# Patient Record
Sex: Male | Born: 1965 | Race: Black or African American | Hispanic: No | Marital: Single | Smoking: Current every day smoker
Health system: Southern US, Community
[De-identification: ages and names within clinical notes are randomized; demographics above are authoritative.]

## PROBLEM LIST (undated history)

## (undated) DIAGNOSIS — L930 Discoid lupus erythematosus: Secondary | ICD-10-CM

## (undated) DIAGNOSIS — F1010 Alcohol abuse, uncomplicated: Secondary | ICD-10-CM

## (undated) DIAGNOSIS — I10 Essential (primary) hypertension: Secondary | ICD-10-CM

## (undated) DIAGNOSIS — Z8701 Personal history of pneumonia (recurrent): Secondary | ICD-10-CM

## (undated) DIAGNOSIS — I471 Supraventricular tachycardia, unspecified: Secondary | ICD-10-CM

## (undated) HISTORY — DX: Supraventricular tachycardia, unspecified: I47.10

## (undated) HISTORY — DX: Alcohol abuse, uncomplicated: F10.10

## (undated) HISTORY — DX: Personal history of pneumonia (recurrent): Z87.01

## (undated) HISTORY — DX: Supraventricular tachycardia: I47.1

## (undated) HISTORY — PX: ELBOW SURGERY: SHX618

## (undated) HISTORY — DX: Essential (primary) hypertension: I10

---

## 2000-08-28 ENCOUNTER — Emergency Department (HOSPITAL_COMMUNITY): Admission: EM | Admit: 2000-08-28 | Discharge: 2000-08-28 | Payer: Self-pay | Admitting: *Deleted

## 2001-05-24 ENCOUNTER — Emergency Department (HOSPITAL_COMMUNITY): Admission: EM | Admit: 2001-05-24 | Discharge: 2001-05-24 | Payer: Self-pay | Admitting: *Deleted

## 2001-08-02 ENCOUNTER — Encounter: Payer: Self-pay | Admitting: Emergency Medicine

## 2001-08-02 ENCOUNTER — Emergency Department (HOSPITAL_COMMUNITY): Admission: EM | Admit: 2001-08-02 | Discharge: 2001-08-02 | Payer: Self-pay | Admitting: Emergency Medicine

## 2001-08-02 ENCOUNTER — Observation Stay (HOSPITAL_COMMUNITY): Admission: EM | Admit: 2001-08-02 | Discharge: 2001-08-03 | Payer: Self-pay | Admitting: *Deleted

## 2001-08-03 ENCOUNTER — Encounter: Payer: Self-pay | Admitting: *Deleted

## 2001-09-03 ENCOUNTER — Emergency Department (HOSPITAL_COMMUNITY): Admission: EM | Admit: 2001-09-03 | Discharge: 2001-09-03 | Payer: Self-pay | Admitting: Emergency Medicine

## 2002-10-13 ENCOUNTER — Emergency Department (HOSPITAL_COMMUNITY): Admission: EM | Admit: 2002-10-13 | Discharge: 2002-10-13 | Payer: Self-pay | Admitting: Emergency Medicine

## 2003-10-11 ENCOUNTER — Emergency Department (HOSPITAL_COMMUNITY): Admission: EM | Admit: 2003-10-11 | Discharge: 2003-10-11 | Payer: Self-pay | Admitting: Emergency Medicine

## 2004-03-13 ENCOUNTER — Emergency Department (HOSPITAL_COMMUNITY): Admission: EM | Admit: 2004-03-13 | Discharge: 2004-03-13 | Payer: Self-pay | Admitting: Emergency Medicine

## 2004-07-16 ENCOUNTER — Emergency Department (HOSPITAL_COMMUNITY): Admission: EM | Admit: 2004-07-16 | Discharge: 2004-07-16 | Payer: Self-pay | Admitting: Emergency Medicine

## 2005-03-19 ENCOUNTER — Emergency Department (HOSPITAL_COMMUNITY): Admission: EM | Admit: 2005-03-19 | Discharge: 2005-03-19 | Payer: Self-pay | Admitting: Emergency Medicine

## 2007-07-30 ENCOUNTER — Emergency Department (HOSPITAL_COMMUNITY): Admission: EM | Admit: 2007-07-30 | Discharge: 2007-07-30 | Payer: Self-pay | Admitting: Emergency Medicine

## 2008-08-21 ENCOUNTER — Emergency Department (HOSPITAL_COMMUNITY): Admission: EM | Admit: 2008-08-21 | Discharge: 2008-08-21 | Payer: Self-pay | Admitting: Emergency Medicine

## 2010-01-23 ENCOUNTER — Inpatient Hospital Stay (HOSPITAL_COMMUNITY): Admission: EM | Admit: 2010-01-23 | Discharge: 2010-01-25 | Payer: Self-pay | Admitting: Emergency Medicine

## 2010-04-29 ENCOUNTER — Emergency Department (HOSPITAL_COMMUNITY)
Admission: EM | Admit: 2010-04-29 | Discharge: 2010-04-29 | Disposition: A | Discharge status: Home / Self Care | Payer: Self-pay | Attending: Emergency Medicine | Admitting: Emergency Medicine

## 2010-04-29 DIAGNOSIS — I10 Essential (primary) hypertension: Secondary | ICD-10-CM | POA: Insufficient documentation

## 2010-04-29 DIAGNOSIS — M329 Systemic lupus erythematosus, unspecified: Secondary | ICD-10-CM | POA: Insufficient documentation

## 2010-04-29 DIAGNOSIS — F17200 Nicotine dependence, unspecified, uncomplicated: Secondary | ICD-10-CM | POA: Insufficient documentation

## 2010-04-29 DIAGNOSIS — R51 Headache: Secondary | ICD-10-CM | POA: Insufficient documentation

## 2010-04-29 DIAGNOSIS — R42 Dizziness and giddiness: Secondary | ICD-10-CM | POA: Insufficient documentation

## 2010-04-29 LAB — BASIC METABOLIC PANEL
BUN: 7 mg/dL (ref 6–23)
CO2: 26 mEq/L (ref 19–32)
Calcium: 9.5 mg/dL (ref 8.4–10.5)
Chloride: 98 mEq/L (ref 96–112)
Glucose, Bld: 100 mg/dL — ABNORMAL HIGH (ref 70–99)
Sodium: 136 mEq/L (ref 135–145)

## 2010-04-29 LAB — ETHANOL: Alcohol, Ethyl (B): 138 mg/dL — ABNORMAL HIGH (ref 0–10)

## 2010-05-17 ENCOUNTER — Emergency Department (HOSPITAL_COMMUNITY)
Admission: EM | Admit: 2010-05-17 | Discharge: 2010-05-17 | Disposition: A | Discharge status: Home / Self Care | Payer: Self-pay | Attending: Emergency Medicine | Admitting: Emergency Medicine

## 2010-05-17 DIAGNOSIS — R51 Headache: Secondary | ICD-10-CM | POA: Insufficient documentation

## 2010-05-17 DIAGNOSIS — J329 Chronic sinusitis, unspecified: Secondary | ICD-10-CM | POA: Insufficient documentation

## 2010-05-17 DIAGNOSIS — I10 Essential (primary) hypertension: Secondary | ICD-10-CM | POA: Insufficient documentation

## 2010-06-01 LAB — URINALYSIS, ROUTINE W REFLEX MICROSCOPIC
Hgb urine dipstick: NEGATIVE
Ketones, ur: NEGATIVE mg/dL
Nitrite: NEGATIVE
pH: 6 (ref 5.0–8.0)

## 2010-06-01 LAB — CBC
HCT: 34 % — ABNORMAL LOW (ref 39.0–52.0)
HCT: 38.7 % — ABNORMAL LOW (ref 39.0–52.0)
MCHC: 33.5 g/dL (ref 30.0–36.0)
MCV: 91.7 fL (ref 78.0–100.0)
Platelets: 111 10*3/uL — ABNORMAL LOW (ref 150–400)
RDW: 12.6 % (ref 11.5–15.5)
RDW: 13 % (ref 11.5–15.5)
WBC: 3.1 10*3/uL — ABNORMAL LOW (ref 4.0–10.5)

## 2010-06-01 LAB — COMPREHENSIVE METABOLIC PANEL
ALT: 60 U/L — ABNORMAL HIGH (ref 0–53)
Albumin: 3.3 g/dL — ABNORMAL LOW (ref 3.5–5.2)
BUN: 14 mg/dL (ref 6–23)
CO2: 24 mEq/L (ref 19–32)
Chloride: 106 mEq/L (ref 96–112)
Creatinine, Ser: 0.9 mg/dL (ref 0.4–1.5)
Sodium: 137 mEq/L (ref 135–145)

## 2010-06-01 LAB — LIPID PANEL
Triglycerides: 104 mg/dL (ref <150)
VLDL: 21 mg/dL (ref 0–40)

## 2010-06-01 LAB — DIFFERENTIAL
Basophils Absolute: 0 10*3/uL (ref 0.0–0.1)
Eosinophils Relative: 2 % (ref 0–5)
Lymphocytes Relative: 37 % (ref 12–46)
Lymphocytes Relative: 41 % (ref 12–46)
Lymphs Abs: 1.5 10*3/uL (ref 0.7–4.0)
Monocytes Absolute: 0.2 10*3/uL (ref 0.1–1.0)
Monocytes Relative: 7 % (ref 3–12)
Neutro Abs: 1.9 10*3/uL (ref 1.7–7.7)
Neutrophils Relative %: 51 % (ref 43–77)

## 2010-06-01 LAB — BASIC METABOLIC PANEL
Calcium: 10 mg/dL (ref 8.4–10.5)
Chloride: 103 mEq/L (ref 96–112)
Creatinine, Ser: 0.95 mg/dL (ref 0.4–1.5)
Sodium: 140 mEq/L (ref 135–145)

## 2010-06-01 LAB — APTT: aPTT: 31 seconds (ref 24–37)

## 2010-06-01 LAB — POCT CARDIAC MARKERS
CKMB, poc: 9.2 ng/mL (ref 1.0–8.0)
Myoglobin, poc: 184 ng/mL (ref 12–200)
Troponin i, poc: <0.05 ng/mL (ref 0.00–0.09)

## 2010-06-01 LAB — CARDIAC PANEL(CRET KIN+CKTOT+MB+TROPI): CK, MB: 4.4 ng/mL — ABNORMAL HIGH (ref 0.3–4.0)

## 2010-06-01 LAB — RAPID URINE DRUG SCREEN, HOSP PERFORMED
Barbiturates: NOT DETECTED
Tetrahydrocannabinol: NOT DETECTED

## 2010-06-01 LAB — HEPATIC FUNCTION PANEL
ALT: 89 U/L — ABNORMAL HIGH (ref 0–53)
Albumin: 4.4 g/dL (ref 3.5–5.2)
Total Protein: 8.7 g/dL — ABNORMAL HIGH (ref 6.0–8.3)

## 2010-06-01 LAB — PROTIME-INR: Prothrombin Time: 12.6 seconds (ref 11.6–15.2)

## 2010-06-01 LAB — D-DIMER, QUANTITATIVE: D-Dimer, Quant: 0.47 ug/mL-FEU (ref 0.00–0.48)

## 2010-07-30 ENCOUNTER — Emergency Department (HOSPITAL_COMMUNITY)
Admission: EM | Admit: 2010-07-30 | Discharge: 2010-07-30 | Disposition: A | Discharge status: Home / Self Care | Payer: Self-pay | Attending: Emergency Medicine | Admitting: Emergency Medicine

## 2010-07-30 DIAGNOSIS — Y92009 Unspecified place in unspecified non-institutional (private) residence as the place of occurrence of the external cause: Secondary | ICD-10-CM | POA: Insufficient documentation

## 2010-07-30 DIAGNOSIS — IMO0002 Reserved for concepts with insufficient information to code with codable children: Secondary | ICD-10-CM | POA: Insufficient documentation

## 2010-07-30 DIAGNOSIS — T1500XA Foreign body in cornea, unspecified eye, initial encounter: Secondary | ICD-10-CM | POA: Insufficient documentation

## 2010-08-06 NOTE — Discharge Summary (Signed)
[place]  Patient:    Jim Gordon, Jim Gordon Visit Number: [id number] MRN: [id number]          Service Type: MED Location: [phone] Attending Physician:  Jim Gordon Dictated by:   Jim Gordon, P.A.-C. Admit Date:  08/02/2001 Discharge Date: 08/03/2001   CC:         Jim Gordon. Jim Gordon, M.D.  Jim Gordon, M.D.  Jim Gordon. Jim Gordon, M.D. [facility]   Referring Physician Discharge [place]  DATE OF BIRTH:  02-22-1966  PROCEDURES:  2-D echocardiogram.  HOSPITAL COURSE:  Jim Gordon is a 45 year old male with a history of systemic lupus, who had dull, substernal chest pain that started four days prior to admission.  It felt worse lying down and was not associated with any shortness of breath or other symptoms.  On the day of admission, the patient suddenly increased to a 10/10 and was very sharp, associated with shortness of breath. He came to the emergency room and was admitted to the hospital to rule out MI and for further evaluation.  He was started on anti-inflammatories because it was felt that this pain was most likely secondary to either pericarditis or a pericardial effusion.  The patient had a 2-D echocardiogram to assess for a pericardial effusion. The 2-D echocardiogram summary shows an ejection fraction of 65% with no regional wall motion abnormalities.  There was no pericardial effusion, but the left ventricular walls looked a little thickened.  The patient additionally had enzymes checked for MI, and these were negative serially. Additionally, he had a drug screen done which was also negative.  His C3 complement and C4 complement were within normal limits.  The next day, the patients pain was much better controlled, and his enzymes were negative.  He had no significant laboratory abnormalities except a mild anemia and borderline thrombocytopenia.  His white count was within normal limits, and ibuprofen was controlling his pain.   He was considered stable for discharge on Aug 03, 2001, with follow-up arranged with cardiology and a second opinion arranged with Dr. Coral Gordon.  The patient is additionally supposed to follow up with his primary care physician, Dr. Kellie Gordon.  2-D echocardiogram:  Ejection fraction was estimated to be 65% with no regional wall motion abnormalities and left ventricular thickness mildly increased.  There was trivial tricuspid valvular regurgitation and trivial mitral valvular regurgitation.  There was no pericardial effusion, and the aortic root was normal in size.  LABORATORY VALUES:  Hemoglobin 12.6, hematocrit 37.5, WBC 6.5, platelets 133. ESR 31.  INR 1.2, PTT 30.  Sodium 133, potassium 3.6, chloride 100, CO2 31, BUN 10, creatinine 0.9, glucose 102.  Other CMET values within normal limits. CK-MB elevated between 240-416, but the MBs are negative, and the relative index is negative.  Troponin I negative x 3 as well.  C3 complement 94, C4 complement 25.  UDS negative with urinalysis negative as well.  The patient has multiple laboratory testing pending at the time of dictation.  DISCHARGE CONDITION:  Improved.  DISCHARGE DIAGNOSES: 1. Chest pain, possibly felt secondary to pericarditis without a pericardial    effusion. 2. History of systemic lupus erythematosus. 3. History of pneumonia in February 2003. 4. History of ALLERGY - PENICILLIN. 5. Ongoing tobacco use. 6. History of frequent ETOH use. 7. Mild thrombocytopenia. 8. Mild anemia.  DISCHARGE INSTRUCTIONS: 1. His activity level is to be gradually increased, and he can return to work    on Monday. 2. He  is to stick to a low fat diet. 3. He is to quit tobacco if possible. 4. He is to follow up in the [place] office per patient request on Friday,    Aug 17, 2001, at 2:30 with Dr. Dietrich Gordon. 5. He is to follow up with Dr. Coral Gordon, and his office will call. 6. He is to follow up with Dr. Kellie Gordon, and he is to call for an  appointment.  DISCHARGE MEDICATIONS:  Motrin 800 mg 1 tab t.i.d. x 1 week. Dictated by:   Jim Gordon, P.A.-C. Attending Physician:  Jim Gordon DD:  08/03/01 TD:  08/03/01 Job: [id number]

## 2010-12-15 LAB — CBC
HCT: 39.8
Hemoglobin: 13.7
MCHC: 34.4
MCV: 89.7
Platelets: 134 — ABNORMAL LOW
RDW: 12.7
WBC: 3.9 — ABNORMAL LOW

## 2010-12-15 LAB — BASIC METABOLIC PANEL
BUN: 8
Creatinine, Ser: 0.91
GFR calc Af Amer: >60
GFR calc non Af Amer: >60
Glucose, Bld: 89
Potassium: 4.5

## 2010-12-15 LAB — D-DIMER, QUANTITATIVE: D-Dimer, Quant: 0.28

## 2010-12-15 LAB — TROPONIN I: Troponin I: 0.04

## 2010-12-15 LAB — DIFFERENTIAL
Basophils Absolute: 0
Basophils Relative: 1
Eosinophils Absolute: 0.1
Lymphs Abs: 1
Neutrophils Relative %: 64

## 2011-02-15 ENCOUNTER — Emergency Department (HOSPITAL_COMMUNITY): Payer: Self-pay

## 2011-02-15 ENCOUNTER — Emergency Department (HOSPITAL_COMMUNITY)
Admission: EM | Admit: 2011-02-15 | Discharge: 2011-02-15 | Disposition: A | Discharge status: Home / Self Care | Payer: Self-pay | Attending: Emergency Medicine | Admitting: Emergency Medicine

## 2011-02-15 ENCOUNTER — Encounter: Payer: Self-pay | Admitting: Emergency Medicine

## 2011-02-15 DIAGNOSIS — S61409A Unspecified open wound of unspecified hand, initial encounter: Secondary | ICD-10-CM | POA: Insufficient documentation

## 2011-02-15 DIAGNOSIS — I10 Essential (primary) hypertension: Secondary | ICD-10-CM | POA: Insufficient documentation

## 2011-02-15 DIAGNOSIS — W268XXA Contact with other sharp object(s), not elsewhere classified, initial encounter: Secondary | ICD-10-CM | POA: Insufficient documentation

## 2011-02-15 DIAGNOSIS — T148XXA Other injury of unspecified body region, initial encounter: Secondary | ICD-10-CM

## 2011-02-15 DIAGNOSIS — F17200 Nicotine dependence, unspecified, uncomplicated: Secondary | ICD-10-CM | POA: Insufficient documentation

## 2011-02-15 DIAGNOSIS — L930 Discoid lupus erythematosus: Secondary | ICD-10-CM | POA: Insufficient documentation

## 2011-02-15 DIAGNOSIS — Z23 Encounter for immunization: Secondary | ICD-10-CM | POA: Insufficient documentation

## 2011-02-15 HISTORY — DX: Discoid lupus erythematosus: L93.0

## 2011-02-15 LAB — DIFFERENTIAL
Basophils Absolute: 0 10*3/uL (ref 0.0–0.1)
Basophils Relative: 1 % (ref 0–1)
Eosinophils Absolute: 0.2 10*3/uL (ref 0.0–0.7)
Neutro Abs: 1.7 10*3/uL (ref 1.7–7.7)
Neutrophils Relative %: 40 % — ABNORMAL LOW (ref 43–77)

## 2011-02-15 LAB — BASIC METABOLIC PANEL
Chloride: 99 mEq/L (ref 96–112)
GFR calc Af Amer: >90 mL/min (ref >90)
GFR calc non Af Amer: >90 mL/min (ref >90)
Potassium: 4.1 mEq/L (ref 3.5–5.1)
Sodium: 136 mEq/L (ref 135–145)

## 2011-02-15 LAB — CBC
Hemoglobin: 12.2 g/dL — ABNORMAL LOW (ref 13.0–17.0)
MCHC: 32.5 g/dL (ref 30.0–36.0)
Platelets: 212 10*3/uL (ref 150–400)
RDW: 12.8 % (ref 11.5–15.5)

## 2011-02-15 MED ORDER — LEVOFLOXACIN IN D5W 500 MG/100ML IV SOLN
500.0000 mg | Freq: Once | INTRAVENOUS | Status: AC
  Administered 2011-02-15: 500 mg via INTRAVENOUS
  Filled 2011-02-15: qty 100

## 2011-02-15 MED ORDER — LEVOFLOXACIN 500 MG PO TABS: 500.0000 mg | ORAL_TABLET | Freq: Every day | ORAL | Status: AC

## 2011-02-15 MED ORDER — HYDROCODONE-ACETAMINOPHEN 5-325 MG PO TABS: 1.0000 | ORAL_TABLET | Freq: Four times a day (QID) | ORAL | Status: AC | PRN

## 2011-02-15 MED ORDER — TETANUS-DIPHTH-ACELL PERTUSSIS 5-2.5-18.5 LF-MCG/0.5 IM SUSP
0.5000 mL | Freq: Once | INTRAMUSCULAR | Status: AC
  Administered 2011-02-15: 0.5 mL via INTRAMUSCULAR
  Filled 2011-02-15: qty 0.5

## 2011-02-15 NOTE — ED Provider Notes (Signed)
History  This chart was scribed for Celene Kras, MD by Bennett Scrape. This patient was seen in room APA02/APA02 and the patient's care was started at 8:51AM.  CSN: [id number] Arrival date & time: 02/15/2011  8:45 AM   First MD Initiated Contact with Patient 02/15/11 [phone]      Chief Complaint  Patient presents with  . Facial Swelling  . Arm Swelling  . Sore Throat    The history is provided by the patient. No language interpreter was used.   Jim Gordon is a 45 y.o. male who presents to the Emergency Department complaining of 4 days of mild, constant right hand swelling that occurred after a nail punctured the pt's right hand while he was using a hammer. Pt states that he developed a gradually worsening sore throat a few days after and reports that he noticed that his lips had become swollen this morning. Pt reports that the puncture wound has scabbed over and denies pain to the area with movement or palpation. Pt denies fever, vomiting and diarrhea as associated symptoms. Pt denies knowledge of receiving a tetanus shot within the past 10 years. Pt has a h/o HTN and discoid lupus. He is a smoker.   Past Medical History  Diagnosis Date  . Hypertension   . Discoid lupus     Past Surgical History  Procedure Date  . Elbow surgery     left    History reviewed. No pertinent family history.  History  Substance Use Topics  . Smoking status: Current Everyday Smoker -- 1.0 packs/day for 20 years    Types: Cigarettes  . Smokeless tobacco: Never Used  . Alcohol Use: 30.0 oz/week    50 Cans of beer per week     Review of Systems A complete 10 system review of systems was obtained and is otherwise negative except as noted in the HPI.   Allergies  Penicillins  Home Medications   Current Outpatient Rx  Name Route Sig Dispense Refill  . LISINOPRIL 5 MG PO TABS Oral Take 5 mg by mouth daily.      Marland Kitchen METOPROLOL TARTRATE 25 MG PO TABS Oral Take 25 mg by mouth 2 (two) times  daily.       Triage Vitals: BP 121/89  Pulse 76  Temp 98.3 F (36.8 C)  Resp 16  Ht 6' 1.5" (1.867 m)  Wt 185 lb (83.915 kg)  BMI 24.08 kg/m2  SpO2 98%  Physical Exam  Nursing note and vitals reviewed. Constitutional: He appears well-developed and well-nourished. No distress.  HENT:  Head: Normocephalic and atraumatic.  Right Ear: External ear normal.  Left Ear: External ear normal.       Poor dentition, mild erythema of pharnyx but no tongue swelling, mild erythema and swelling to both lips  Eyes: Conjunctivae are normal. Right eye exhibits no discharge. Left eye exhibits no discharge. No scleral icterus.  Neck: Neck supple. No tracheal deviation present.  Cardiovascular: Normal rate, regular rhythm and intact distal pulses.   Pulmonary/Chest: Effort normal and breath sounds normal. No stridor. No respiratory distress. He has no wheezes. He has no rales.  Abdominal: Soft. Bowel sounds are normal. He exhibits no distension. There is no tenderness. There is no rebound and no guarding.  Musculoskeletal: He exhibits no edema and no tenderness.       Healed puncture wound on TM joint of the right 5th metatarsal, mild erythema around the puncture with mild right hand swelling, no pain upon  flexion of the right 5th finger or right wrist  Neurological: He is alert. He has normal strength. No sensory deficit. Cranial nerve deficit:  no gross defecits noted. He exhibits normal muscle tone. He displays no seizure activity. Coordination normal.  Skin: Skin is warm and dry. No rash noted.       Areas of hypopigmentation and erythema on both checks with scabbing along the sides   Psychiatric: He has a normal mood and affect.    ED Course  Procedures (including critical care time)  DIAGNOSTIC STUDIES: Oxygen Saturation is 98% on room air, normal by my interpretation.    COORDINATION OF CARE: 8:53AM-Discussed treatment plan with patient at bedside and patient agreed to plan.   Labs  Reviewed  CBC - Abnormal; Notable for the following:    RBC 4.20 (*)    Hemoglobin 12.2 (*)    HCT 37.5 (*)    All other components within normal limits  DIFFERENTIAL - Abnormal; Notable for the following:    Neutrophils Relative 40 (*)    All other components within normal limits  BASIC METABOLIC PANEL   Dg Hand Complete Right  02/15/2011  *RADIOLOGY REPORT*  Clinical Data: Swelling.  Nail injury fifth metacarpal.  Pain and swelling in the region of the fifth metacarpal.  RIGHT HAND - COMPLETE 3+ VIEW  Comparison: None.  Findings: Bony mineralization is normal and the joints of the hand are aligned.  There is some soft tissue swelling adjacent to the fifth metacarpal.  On the frontal view, a tiny, approximately 2 mm, radiopaque density projects adjacent to the ulnar side of the fifth metacarpal head. This density is not definitely seen on additional views.  This could be a small amount of debris within or overlying soft tissues, or a tiny bony fragment.  Otherwise, the fifth metacarpal appears intact and specifically there is no lucency through the metacarpal.  It is noted that the tuft of the distal phalanx of the fourth digit is irregular and contour, likely chronic and there is a 1 mm bony fragment just distal to the tuft, which could be chronic, or could reflect a tiny acute fracture fragment.  Suggest clinical correlation.  IMPRESSION:  1.  Tiny radiodensity adjacent to the fifth metacarpal head seen on the frontal view.  Question small amount of debris internal or external to the hand versus a tiny bony fracture fragment. 2.  Tiny bony density adjacent to the tuft of the fourth finger. This may be chronic, given the diffuse irregularity of the contour of the tuft of the distal phalanx.  Tiny acute fracture fragment not excluded.  Original Report Authenticated By: Britta Mccreedy, M.D.     Diagnosis: Puncture wound   MDM  Patient has soft tissue swelling following a puncture wound. X-ray  suggests the possibility of either a small chip fracture or or retained foreign body. Patient is not demonstrating signs of tenosynovitis as I am able to passively range his finger without significant pain. However I do have some concerns about an early infection. Patient will be splinted. He'll prescribe him a course of Levaquin. I will give him referral to see Dr. Hilda Lias in followup.  I personally performed the services described in this documentation, which was scribed in my presence.  The recorded information has been reviewed and considered.       Celene Kras, MD 02/15/11 1026

## 2011-02-15 NOTE — ED Notes (Addendum)
Patient had nail go through right hand. Right hand notably swollen. Wound to had scabbed over. Per patient woke up this morning and lips were swollen, sore throat. Per patient believed to have had tetanus shot within last 10 years but not completely sure.

## 2012-04-01 ENCOUNTER — Encounter (HOSPITAL_COMMUNITY): Payer: Self-pay | Admitting: *Deleted

## 2012-04-01 ENCOUNTER — Emergency Department (HOSPITAL_COMMUNITY)
Admission: EM | Admit: 2012-04-01 | Discharge: 2012-04-01 | Disposition: A | Discharge status: Home / Self Care | Payer: Self-pay | Attending: Emergency Medicine | Admitting: Emergency Medicine

## 2012-04-01 DIAGNOSIS — R51 Headache: Secondary | ICD-10-CM | POA: Insufficient documentation

## 2012-04-01 DIAGNOSIS — L930 Discoid lupus erythematosus: Secondary | ICD-10-CM | POA: Insufficient documentation

## 2012-04-01 DIAGNOSIS — F17200 Nicotine dependence, unspecified, uncomplicated: Secondary | ICD-10-CM | POA: Insufficient documentation

## 2012-04-01 DIAGNOSIS — Z79899 Other long term (current) drug therapy: Secondary | ICD-10-CM | POA: Insufficient documentation

## 2012-04-01 DIAGNOSIS — I10 Essential (primary) hypertension: Secondary | ICD-10-CM | POA: Insufficient documentation

## 2012-04-01 MED ORDER — FENTANYL CITRATE 0.05 MG/ML IJ SOLN
100.0000 ug | Freq: Once | INTRAMUSCULAR | Status: AC
  Administered 2012-04-01: 100 ug via NASAL

## 2012-04-01 MED ORDER — FENTANYL CITRATE 0.05 MG/ML IJ SOLN
100.0000 ug | Freq: Once | INTRAMUSCULAR | Status: DC
  Filled 2012-04-01: qty 2

## 2012-04-01 MED ORDER — METOCLOPRAMIDE HCL 10 MG PO TABS: 10.0000 mg | ORAL_TABLET | Freq: Four times a day (QID) | ORAL | Status: DC | PRN

## 2012-04-01 MED ORDER — HYDROCODONE-ACETAMINOPHEN 5-325 MG PO TABS
2.0000 | ORAL_TABLET | ORAL | Status: DC | PRN
Start: 2012-04-01 — End: 2012-06-09

## 2012-04-01 MED ORDER — DIPHENHYDRAMINE HCL 25 MG PO CAPS
50.0000 mg | ORAL_CAPSULE | Freq: Once | ORAL | Status: AC
  Administered 2012-04-01: 50 mg via ORAL
  Filled 2012-04-01: qty 2

## 2012-04-01 MED ORDER — METOCLOPRAMIDE HCL 5 MG/ML IJ SOLN
10.0000 mg | Freq: Once | INTRAMUSCULAR | Status: AC
  Administered 2012-04-01: 10 mg via INTRAMUSCULAR
  Filled 2012-04-01: qty 2

## 2012-04-01 MED ORDER — ONDANSETRON 8 MG PO TBDP: ORAL_TABLET | ORAL | Status: DC

## 2012-04-01 NOTE — ED Provider Notes (Signed)
History  This chart was scribed for Jim Horn, MD by Madaline Brilliant, ED Scribe. The patient was seen in room APA10/APA10. Patient's care was started at 1620.  CSN: [id number]  Arrival date & time 04/01/12  1453   First MD Initiated Contact with Patient 04/01/12 1620      Chief Complaint  Patient presents with  . Headache   The history is provided by the patient. No language interpreter was used.   Jim Gordon is a 47 y.o. male who presents to the Emergency Department complaining of a intermittent, moderate headache on the top of his head of gradual onset this morning, gradually worsening throughout the day. He states that he took HTN medication (lisonopril) and ibuprofen today with no relief of headache. Patient states that he had missed a couple of days of BP pills. Patient states a history of once-daily headaches over the last couple of years. He denies changes in vision by headaches, but states that he was diagnosed with cataracts a couple of months ago. Patient denies fever, changes in speech, vision changes currently, dysphagia, and changes in understanding. No weakness or loss of coordination and recent head trauma or current immediate family history of headaches, or present numbness. Ocassional headaches onces every day or so.  Blood pressure is almost normal today with resuming medication.  Past Medical History  Diagnosis Date  . Hypertension   . Discoid lupus     Past Surgical History  Procedure Date  . Elbow surgery     left    No family history on file.  History  Substance Use Topics  . Smoking status: Current Every Day Smoker -- 1.0 packs/day for 20 years    Types: Cigarettes  . Smokeless tobacco: Never Used  . Alcohol Use: 30.0 oz/week    50 Cans of beer per week      Review of Systems  All other systems reviewed and are negative.  10 Systems reviewed and all are negative for acute change except as noted in the HPI.    Allergies  Penicillins  Home  Medications   Current Outpatient Rx  Name  Route  Sig  Dispense  Refill  . LISINOPRIL 5 MG PO TABS   Oral   Take 5 mg by mouth daily.           Marland Kitchen METOPROLOL TARTRATE 25 MG PO TABS   Oral   Take 25 mg by mouth 2 (two) times daily.          Marland Kitchen HYDROCODONE-ACETAMINOPHEN 5-325 MG PO TABS   Oral   Take 2 tablets by mouth every 4 (four) hours as needed for pain.   6 tablet   0   . METOCLOPRAMIDE HCL 10 MG PO TABS   Oral   Take 1 tablet (10 mg total) by mouth every 6 (six) hours as needed (nausea/headache).   6 tablet   0   . ONDANSETRON 8 MG PO TBDP      8mg  ODT q4 hours prn nausea   4 tablet   0     Triage Vitals:  BP 149/90  Pulse 71  Temp 98 F (36.7 C)  Resp 18  SpO2 100%  Physical Exam  Nursing note and vitals reviewed. Constitutional:       Awake, alert, nontoxic appearance with baseline speech for patient.  HENT:  Head: Atraumatic.  Mouth/Throat: No oropharyngeal exudate.  Eyes: EOM are normal. Pupils are equal, round, and reactive to light. Right eye  exhibits no discharge. Left eye exhibits no discharge.  Neck: Neck supple.  Cardiovascular: Normal rate and regular rhythm.   No murmur heard. Pulmonary/Chest: Effort normal and breath sounds normal. No stridor. No respiratory distress. He has no wheezes. He has no rales. He exhibits no tenderness.  Abdominal: Soft. Bowel sounds are normal. He exhibits no mass. There is no tenderness. There is no rebound.  Musculoskeletal: He exhibits no tenderness.       Baseline ROM, moves extremities with no obvious new focal weakness.  Lymphadenopathy:    He has no cervical adenopathy.  Neurological:       Awake, alert, cooperative and aware of situation; motor strength bilaterally; sensation normal to light touch bilaterally; peripheral visual fields full to confrontation; no facial asymmetry; tongue midline; major cranial nerves appear intact; no pronator drift, normal finger to nose bilaterally, baseline gait without  new ataxia.  Skin: No rash noted.  Psychiatric: He has a normal mood and affect.      ED Course  Procedures (including critical care time)  DIAGNOSTIC STUDIES: Oxygen Saturation is 100% on room air, normal, by my interpretation.    COORDINATION OF CARE:  1625: Patient / Family / Caregiver understand and agree with initial ED impression and plan with expectations set for ED visit. 1645 Ordered:  Meds ordered this encounter  Medications  . diphenhydrAMINE (BENADRYL) capsule 50 mg    Sig:   . DISCONTD: fentaNYL (SUBLIMAZE) injection 100 mcg    Sig:   . metoCLOPramide (REGLAN) injection 10 mg    Sig:   . HYDROcodone-acetaminophen (NORCO) 5-325 MG per tablet    Sig: Take 2 tablets by mouth every 4 (four) hours as needed for pain.    Dispense:  6 tablet    Refill:  0  . metoCLOPramide (REGLAN) 10 MG tablet    Sig: Take 1 tablet (10 mg total) by mouth every 6 (six) hours as needed (nausea/headache).    Dispense:  6 tablet    Refill:  0  . ondansetron (ZOFRAN ODT) 8 MG disintegrating tablet    Sig: 8mg  ODT q4 hours prn nausea    Dispense:  4 tablet    Refill:  0  . fentaNYL (SUBLIMAZE) injection 100 mcg    Sig:      Labs Reviewed - No data to display No results found.   1. Headache       MDM   I personally performed the services described in this documentation, which was scribed in my presence. The recorded information has been reviewed and is accurate.  Patient / Family / Caregiver informed of clinical course, understand medical decision-making process, and agree with plan.I doubt any other EMC precluding discharge at this time including, but not necessarily limited to the following:SAH, CVA.     Jim Horn, MD 04/02/12 [phone]

## 2012-04-01 NOTE — ED Notes (Signed)
MD at bedside. 

## 2012-04-01 NOTE — ED Notes (Signed)
Patient with no complaints at this time. Respirations even and unlabored. Skin warm/dry. Discharge instructions reviewed with patient at this time. Patient given opportunity to voice concerns/ask questions. Patient discharged at this time and left Emergency Department with steady gait.   

## 2012-04-01 NOTE — ED Notes (Signed)
Pt states that he has been out of his blood pressure medication for a few days, began to experience headache located on top of the head with gradual onset this am, did take his medication today, lisinopril 5mg  with no change in headache. Denies any n/v, vision changes.

## 2012-06-09 ENCOUNTER — Encounter (HOSPITAL_COMMUNITY): Payer: Self-pay | Admitting: Emergency Medicine

## 2012-06-09 ENCOUNTER — Emergency Department (HOSPITAL_COMMUNITY): Payer: Self-pay

## 2012-06-09 ENCOUNTER — Emergency Department (HOSPITAL_COMMUNITY)
Admission: EM | Admit: 2012-06-09 | Discharge: 2012-06-09 | Disposition: A | Discharge status: Home / Self Care | Payer: Self-pay | Attending: Emergency Medicine | Admitting: Emergency Medicine

## 2012-06-09 DIAGNOSIS — F411 Generalized anxiety disorder: Secondary | ICD-10-CM | POA: Insufficient documentation

## 2012-06-09 DIAGNOSIS — Z79899 Other long term (current) drug therapy: Secondary | ICD-10-CM | POA: Insufficient documentation

## 2012-06-09 DIAGNOSIS — F17200 Nicotine dependence, unspecified, uncomplicated: Secondary | ICD-10-CM | POA: Insufficient documentation

## 2012-06-09 DIAGNOSIS — R51 Headache: Secondary | ICD-10-CM | POA: Insufficient documentation

## 2012-06-09 DIAGNOSIS — F1010 Alcohol abuse, uncomplicated: Secondary | ICD-10-CM | POA: Insufficient documentation

## 2012-06-09 DIAGNOSIS — Z872 Personal history of diseases of the skin and subcutaneous tissue: Secondary | ICD-10-CM | POA: Insufficient documentation

## 2012-06-09 DIAGNOSIS — I10 Essential (primary) hypertension: Secondary | ICD-10-CM | POA: Insufficient documentation

## 2012-06-09 DIAGNOSIS — E876 Hypokalemia: Secondary | ICD-10-CM | POA: Insufficient documentation

## 2012-06-09 DIAGNOSIS — Z760 Encounter for issue of repeat prescription: Secondary | ICD-10-CM | POA: Insufficient documentation

## 2012-06-09 DIAGNOSIS — R5381 Other malaise: Secondary | ICD-10-CM | POA: Insufficient documentation

## 2012-06-09 LAB — URINALYSIS, ROUTINE W REFLEX MICROSCOPIC
Glucose, UA: NEGATIVE mg/dL
Leukocytes, UA: NEGATIVE
Nitrite: NEGATIVE
Specific Gravity, Urine: >1.03 — ABNORMAL HIGH (ref 1.005–1.030)
pH: 6 (ref 5.0–8.0)

## 2012-06-09 LAB — CBC WITH DIFFERENTIAL/PLATELET
Basophils Absolute: 0 10*3/uL (ref 0.0–0.1)
Basophils Relative: 1 % (ref 0–1)
HCT: 37.4 % — ABNORMAL LOW (ref 39.0–52.0)
Hemoglobin: 12.8 g/dL — ABNORMAL LOW (ref 13.0–17.0)
Lymphocytes Relative: 30 % (ref 12–46)
Monocytes Absolute: 0.2 10*3/uL (ref 0.1–1.0)
Monocytes Relative: 5 % (ref 3–12)
Neutro Abs: 2.7 10*3/uL (ref 1.7–7.7)
Neutrophils Relative %: 63 % (ref 43–77)
WBC: 4.3 10*3/uL (ref 4.0–10.5)

## 2012-06-09 LAB — BASIC METABOLIC PANEL
BUN: 11 mg/dL (ref 6–23)
CO2: 26 mEq/L (ref 19–32)
Chloride: 100 mEq/L (ref 96–112)
Creatinine, Ser: 0.84 mg/dL (ref 0.50–1.35)
GFR calc Af Amer: >90 mL/min (ref >90)
Potassium: 3.1 mEq/L — ABNORMAL LOW (ref 3.5–5.1)

## 2012-06-09 LAB — RAPID URINE DRUG SCREEN, HOSP PERFORMED
Benzodiazepines: NOT DETECTED
Cocaine: NOT DETECTED
Opiates: NOT DETECTED

## 2012-06-09 LAB — ETHANOL: Alcohol, Ethyl (B): 53 mg/dL — ABNORMAL HIGH (ref 0–11)

## 2012-06-09 LAB — URINE MICROSCOPIC-ADD ON

## 2012-06-09 MED ORDER — METOPROLOL TARTRATE 25 MG PO TABS: 25.0000 mg | ORAL_TABLET | Freq: Two times a day (BID) | ORAL | Status: DC

## 2012-06-09 MED ORDER — POTASSIUM CHLORIDE ER 10 MEQ PO TBCR: 10.0000 meq | EXTENDED_RELEASE_TABLET | Freq: Two times a day (BID) | ORAL | Status: DC

## 2012-06-09 MED ORDER — LISINOPRIL 5 MG PO TABS: 5.0000 mg | ORAL_TABLET | Freq: Every day | ORAL | Status: DC

## 2012-06-09 NOTE — ED Notes (Signed)
Pt ambulated around nurses station twice at this time no sob or dizziness noted 95 % on room air. Jim Gordon

## 2012-06-09 NOTE — ED Provider Notes (Signed)
History   The patient was seen in room APA04/APA04. Patient's care was started at 8:54 AM.    CSN: [id number]  Arrival date & time 06/09/12  4098   First MD Initiated Contact with Patient 06/09/12 [phone]      Chief Complaint  Patient presents with  . Dizziness  . Hypertension  . 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  He states that he has been without his BP medications for 2 months and admits to drinking alcohol yesterday and again this morning.  States he was riding a scooter from a friend's house this am when his sx's began.  He also c/o feeling "shakey and weak all over" and states his head hurts "all over the top" .  He denies shortness of breath, vomiting, chest pain , localized weakness or abd pain.    Patient is a 47 y.o. male presenting with neurologic complaint. The history is provided by the patient.  Neurologic Problem The primary symptoms include headaches and dizziness. Primary symptoms do not include syncope, loss of consciousness, altered mental status, seizures, visual change, paresthesias, focal weakness, loss of sensation, memory loss, fever, nausea or vomiting. The symptoms began 2 to 6 hours ago. The symptoms are unchanged. The neurological symptoms are diffuse.  The headache began today. The headache developed gradually. Headache is a new problem. The headache is present continuously. Location/region(s) of the headache: parietal. Associated with: alcohol intake. The headache is associated with weakness and loss of balance. The headache is not associated with aura, photophobia, eye pain, visual change, neck stiffness or paresthesias.  Dizziness also occurs with weakness. Dizziness  does not occur with tinnitus, nausea or vomiting.  Additional symptoms include weakness and loss of balance. Additional symptoms do not include neck stiffness, lower back pain, leg pain, photophobia, aura, tinnitus, vertigo or irritability. Medical issues also include alcohol use and hypertension. Medical issues do not include seizures, cerebral vascular accident, cancer, drug use or diabetes.    Past Medical History  Diagnosis Date  . Hypertension   . Discoid lupus     Past Surgical History  Procedure Laterality Date  . Elbow surgery      left    History reviewed. No pertinent family history.  History  Substance Use Topics  . Smoking status: Current Every Day Smoker -- 1.00 packs/day for 20 years    Types: Cigarettes  . Smokeless tobacco: Never Used  . Alcohol Use: 30.0 oz/week    50 Cans of beer per week      Review of Systems  Constitutional: Negative for fever, activity change, appetite change and irritability.  HENT: Negative for facial swelling, trouble swallowing, neck pain, neck stiffness and tinnitus.   Eyes: Negative for photophobia, pain and visual disturbance.  Respiratory: Negative for chest tightness and shortness of breath.   Cardiovascular: Negative for chest pain and syncope.  Gastrointestinal: Negative for nausea, vomiting and abdominal pain.  Genitourinary: Negative for dysuria and hematuria.  Skin: Negative for rash and wound.  Neurological: Positive for dizziness, weakness, headaches and loss of balance. Negative for vertigo, focal weakness, seizures, loss of consciousness, syncope, facial asymmetry, speech difficulty, numbness and paresthesias.  Psychiatric/Behavioral: Negative for memory loss, confusion, decreased concentration and altered mental status. The patient is nervous/anxious.  All other systems reviewed and are negative.    Allergies  Penicillins  Home Medications   Current Outpatient Rx  Name  Route  Sig  Dispense  Refill  .  HYDROcodone-acetaminophen (NORCO) 5-325 MG per tablet   Oral   Take 2 tablets by mouth every 4 (four) hours as needed for pain.   6 tablet   0   . lisinopril (PRINIVIL,ZESTRIL) 5 MG tablet   Oral   Take 5 mg by mouth daily.           . metoCLOPramide (REGLAN) 10 MG tablet   Oral   Take 1 tablet (10 mg total) by mouth every 6 (six) hours as needed (nausea/headache).   6 tablet   0   . metoprolol tartrate (LOPRESSOR) 25 MG tablet   Oral   Take 25 mg by mouth 2 (two) times daily.          . ondansetron (ZOFRAN ODT) 8 MG disintegrating tablet      8mg  ODT q4 hours prn nausea   4 tablet   0     There were no vitals taken for this visit.  Physical Exam  Nursing note and vitals reviewed. Constitutional: He is oriented to person, place, and time. He appears well-developed and well-nourished. No distress.  Patient appears anxious and smells of ETOH  HENT:  Head: Normocephalic and atraumatic.  Mouth/Throat: Oropharynx is clear and moist.  Eyes: Conjunctivae and EOM are normal. Pupils are equal, round, and reactive to light.  Neck: Normal range of motion and phonation normal. Neck supple. No rigidity. No Brudzinski's sign and no Kernig's sign noted.  Cardiovascular: Normal rate, regular rhythm, normal heart sounds and intact distal pulses.   No murmur heard. Pulmonary/Chest: Effort normal and breath sounds normal. He exhibits no tenderness.  Abdominal: Soft. He exhibits no distension. There is no tenderness. There is no rebound and no guarding.  Musculoskeletal: Normal range of motion.  Neurological: He is alert and oriented to person, place, and time. He has normal reflexes. No cranial nerve deficit or sensory deficit. He exhibits normal muscle tone. Coordination and gait normal.  Reflex Scores:      Tricep reflexes are 2+ on the right side and 2+ on the left side.      Bicep reflexes are 2+ on the right side and 2+ on the left side. Skin: Skin is warm and dry.    ED  Course  Procedures (including critical care time) DIAGNOSTIC STUDIES: Oxygen Saturation is 99% on room air, normal,  by my interpretation.    COORDINATION OF CARE: 8:55 AM Discussed ED treatment with pt and pt agrees.     Labs Reviewed  CBC WITH DIFFERENTIAL - Abnormal; Notable for the following:    Hemoglobin 12.8 (*)    HCT 37.4 (*)    All other components within normal limits  BASIC METABOLIC PANEL - Abnormal; Notable for the following:    Potassium 3.1 (*)    All other components within normal limits  ETHANOL - Abnormal; Notable for the following:    Alcohol, Ethyl (B) 53 (*)    All other components within normal limits  TROPONIN I  URINALYSIS, ROUTINE W REFLEX MICROSCOPIC  URINE RAPID DRUG SCREEN (HOSP PERFORMED)   Dg Chest 2 View  06/09/2012  *RADIOLOGY REPORT*  Clinical Data: Dizziness, headache, hypertension  CHEST - 2 VIEW  Comparison: 01/23/2010  Findings: Heart size and vascular pattern are normal.  No infiltrate or effusion.  No change from  prior study.  IMPRESSION: No acute abnormalities.   Original Report Authenticated By: Esperanza Heir, M.D.    Ct Head Wo Contrast  06/09/2012  *RADIOLOGY REPORT*  Clinical Data: Dizziness, hypertension, headache  CT HEAD WITHOUT CONTRAST  Technique:  Contiguous axial images were obtained from the base of the skull through the vertex without contrast.  Comparison: 01/23/2010  Findings: Calvarium is intact.  No hemorrhage, infarct, or mass. No hydrocephalus.  There is mucosal periosteal thickening involving the right sphenoid sinus which is stable from prior study.  IMPRESSION: No acute findings.  Stable chronic sphenoid sinus inflammation.   Original Report Authenticated By: Esperanza Heir, M.D.         MDM    Date: 06/09/2012  Rate: 79  Rhythm: normal sinus rhythm  QRS Axis: normal  Intervals: normal  ST/T Wave abnormalities: nonspecific ST/T changes  Conduction Disutrbances:none  Narrative Interpretation:  LVH  Old EKG  Reviewed: T wave changes improved from 01/23/10  EKG read by Dr. Deretha Emory    Patient is feeling better,  Previous sx's ave resolved.  Has ambulated in the dept w/o difficulty.  No focal neuro deficits on exam.  Appears stable for d/c and requesting to go home. Family member at bedside   patient's hypokalemic, will prescribe short course of potassium and refills of his BP meds.  He agrees to close f/u with his PMD at the [facility] de[facility].  Clinical suspicion for CVA, TIA, SAH or ACS is low.    I have discussed pt hx and care plan with the EDP and pt also seen by EDP prior to d/c.   The patient appears reasonably screened and/or stabilized for discharge and I doubt any other medical condition or other [facility] requiring further screening, evaluation, or treatment in the ED at this time prior to discharge.     Amaryllis Malmquist L. Trisha Mangle, PA-C 06/10/12 2214

## 2012-06-09 NOTE — ED Notes (Signed)
Patient smells of ETOH. States he usually drinks about 3-4 40 oz beers per day. Patient stated this morning he woke up and started on his first 40 oz but estimated he only drank about 12 oz because "I felt dizzy and had a headache." Stating recently released from jail and had not been able to take BP meds due to incarceration. Patient drowsy, will arouse to voice, oriented x 4. No deficits noted. Denies drug use.

## 2012-06-09 NOTE — ED Provider Notes (Signed)
  Medical screening examination/treatment/procedure(s) were conducted as a shared visit with non-physician practitioner(s) and myself.  I personally evaluated the patient during the encounter  Patient seen by me. Patient came in for headache and dizziness. Patient's blood pressure has been uncontrolled. Patient will need followup for the blood pressure. Workup here in the emergency part without any sniffing findings. Patient can be discharged home. Patient sounds as if he has a chronic alcohol problem. And treating heavily of late. For the referral for guidelines for that would be important as well.  Shelda Jakes, MD 06/09/12 1322

## 2012-06-09 NOTE — ED Notes (Signed)
Patient c/o headache with dizziness. Per patient has high blood pressure but has been incarcerated x 2months and was unable to take medication. Patient c/o weakness all over.

## 2012-06-11 NOTE — ED Provider Notes (Signed)
Medical screening examination/treatment/procedure(s) were conducted as a shared visit with non-physician practitioner(s) and myself.  I personally evaluated the patient during the encounter   Shelda Jakes, MD 06/11/12 1102

## 2012-10-07 ENCOUNTER — Emergency Department (HOSPITAL_COMMUNITY)
Admission: EM | Admit: 2012-10-07 | Discharge: 2012-10-07 | Disposition: A | Discharge status: Home / Self Care | Payer: Self-pay | Attending: Emergency Medicine | Admitting: Emergency Medicine

## 2012-10-07 ENCOUNTER — Encounter (HOSPITAL_COMMUNITY): Payer: Self-pay | Admitting: Emergency Medicine

## 2012-10-07 DIAGNOSIS — F17200 Nicotine dependence, unspecified, uncomplicated: Secondary | ICD-10-CM | POA: Insufficient documentation

## 2012-10-07 DIAGNOSIS — I10 Essential (primary) hypertension: Secondary | ICD-10-CM | POA: Insufficient documentation

## 2012-10-07 DIAGNOSIS — Z880 Allergy status to penicillin: Secondary | ICD-10-CM | POA: Insufficient documentation

## 2012-10-07 DIAGNOSIS — Z79899 Other long term (current) drug therapy: Secondary | ICD-10-CM | POA: Insufficient documentation

## 2012-10-07 DIAGNOSIS — R51 Headache: Secondary | ICD-10-CM | POA: Insufficient documentation

## 2012-10-07 DIAGNOSIS — Z872 Personal history of diseases of the skin and subcutaneous tissue: Secondary | ICD-10-CM | POA: Insufficient documentation

## 2012-10-07 MED ORDER — METOCLOPRAMIDE HCL 10 MG PO TABS: 10.0000 mg | ORAL_TABLET | Freq: Four times a day (QID) | ORAL | Status: DC | PRN

## 2012-10-07 MED ORDER — SODIUM CHLORIDE 0.9 % IV BOLUS (SEPSIS)
1000.0000 mL | Freq: Once | INTRAVENOUS | Status: AC
  Administered 2012-10-07: 1000 mL via INTRAVENOUS

## 2012-10-07 MED ORDER — DIPHENHYDRAMINE HCL 50 MG/ML IJ SOLN
25.0000 mg | Freq: Once | INTRAMUSCULAR | Status: AC
  Administered 2012-10-07: 25 mg via INTRAVENOUS
  Filled 2012-10-07: qty 1

## 2012-10-07 MED ORDER — METOCLOPRAMIDE HCL 5 MG/ML IJ SOLN
10.0000 mg | Freq: Once | INTRAMUSCULAR | Status: AC
  Administered 2012-10-07: 10 mg via INTRAVENOUS
  Filled 2012-10-07: qty 2

## 2012-10-07 NOTE — ED Notes (Signed)
Patient c/o headache for "a couple of weeks".  Denies N/V.

## 2012-10-07 NOTE — ED Notes (Signed)
Has been sleeping soundly. Woke with vital signs.

## 2012-10-07 NOTE — ED Provider Notes (Signed)
History    CSN: [id number] Arrival date & time 10/07/12  2440  First MD Initiated Contact with Patient 10/07/12 0459     Chief Complaint  Patient presents with  . Headache   (Consider location/radiation/quality/duration/timing/severity/associated sxs/prior Treatment) Patient is a 47 y.o. male presenting with headaches. The history is provided by the patient.  Headache He complains of a generalized headache for the last 2 months. Headache waxes and wanes but it has been constant tonight. It is moderately severe and he rates the pain at 8/10. Nothing makes it better nothing makes it worse. It is described as a dull, throbbing pain. He has not taken any medication for it. He denies fever, chills, sweats. He is on medication for blood pressure and states he was recently started on propanolol. He denies visual change, weakness, numbness, tingling. There's been no nausea or vomiting. She denies photophobia and phonophobia. Past Medical History  Diagnosis Date  . Hypertension   . Discoid lupus    Past Surgical History  Procedure Laterality Date  . Elbow surgery      left   No family history on file. History  Substance Use Topics  . Smoking status: Current Every Day Smoker -- 1.00 packs/day for 20 years    Types: Cigarettes  . Smokeless tobacco: Never Used  . Alcohol Use: 30.0 oz/week    50 Cans of beer per week    Review of Systems  Neurological: Positive for headaches.  All other systems reviewed and are negative.    Allergies  Penicillins  Home Medications   Current Outpatient Rx  Name  Route  Sig  Dispense  Refill  . propranolol (INDERAL) 40 MG tablet   Oral   Take 40 mg by mouth daily.         Marland Kitchen lisinopril (PRINIVIL,ZESTRIL) 5 MG tablet   Oral   Take 5 mg by mouth daily.           Marland Kitchen lisinopril (PRINIVIL,ZESTRIL) 5 MG tablet   Oral   Take 1 tablet (5 mg total) by mouth daily.   20 tablet   0   . metoprolol (LOPRESSOR) 25 MG tablet   Oral   Take 1  tablet (25 mg total) by mouth 2 (two) times daily.   30 tablet   0   . metoprolol tartrate (LOPRESSOR) 25 MG tablet   Oral   Take 25 mg by mouth 2 (two) times daily.          . potassium chloride (K-DUR) 10 MEQ tablet   Oral   Take 1 tablet (10 mEq total) by mouth 2 (two) times daily.   10 tablet   0    BP 166/107  Pulse 76  Temp(Src) 97.6 F (36.4 C) (Oral)  Resp 18  Ht 6' 1.5" (1.867 m)  Wt 178 lb (80.74 kg)  BMI 23.16 kg/m2  SpO2 96% Physical Exam  Nursing note and vitals reviewed.  47 year old male, resting comfortably and in no acute distress. Vital signs are significant for hypertension with blood pressure 166/107. Oxygen saturation is 96%, which is normal. Head is normocephalic and atraumatic. PERRLA, EOMI. Oropharynx is clear. Fundi show no hemorrhage, exudate, or papilledema. Neck is nontender and supple without adenopathy or JVD. Back is nontender and there is no CVA tenderness. Lungs are clear without rales, wheezes, or rhonchi. Chest is nontender. Heart has regular rate and rhythm without murmur. Abdomen is soft, flat, nontender without masses or hepatosplenomegaly and peristalsis is normoactive.  Extremities have no cyanosis or edema, full range of motion is present. Skin is warm and dry without rash. 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 Neurologic: Mental status is normal, cranial nerves are intact, there are no motor or sensory deficits.  ED Course  Procedures (including critical care time)  1. Headache   2. Hypertension     MDM  Headache of uncertain cause. It does seem to be a benign headache given the chronicity of symptoms and lack of physical findings. He will be given headache cocktail and reassessed. Old records are reviewed and he does have a prior ED visit for headache which was treated with a headache cocktail.  He feels much better after her above noted treatment. He is discharged with prescriptions for metoclopramide.  Dione Booze,  MD 10/07/12 [id number]

## 2012-11-12 ENCOUNTER — Encounter (HOSPITAL_COMMUNITY): Payer: Self-pay | Admitting: *Deleted

## 2012-11-12 ENCOUNTER — Emergency Department (HOSPITAL_COMMUNITY)
Admission: EM | Admit: 2012-11-12 | Discharge: 2012-11-12 | Disposition: A | Discharge status: Home / Self Care | Payer: Self-pay | Attending: Emergency Medicine | Admitting: Emergency Medicine

## 2012-11-12 DIAGNOSIS — Z792 Long term (current) use of antibiotics: Secondary | ICD-10-CM | POA: Insufficient documentation

## 2012-11-12 DIAGNOSIS — Z880 Allergy status to penicillin: Secondary | ICD-10-CM | POA: Insufficient documentation

## 2012-11-12 DIAGNOSIS — R21 Rash and other nonspecific skin eruption: Secondary | ICD-10-CM | POA: Insufficient documentation

## 2012-11-12 DIAGNOSIS — Z872 Personal history of diseases of the skin and subcutaneous tissue: Secondary | ICD-10-CM | POA: Insufficient documentation

## 2012-11-12 DIAGNOSIS — F17200 Nicotine dependence, unspecified, uncomplicated: Secondary | ICD-10-CM | POA: Insufficient documentation

## 2012-11-12 DIAGNOSIS — Z79899 Other long term (current) drug therapy: Secondary | ICD-10-CM | POA: Insufficient documentation

## 2012-11-12 DIAGNOSIS — IMO0002 Reserved for concepts with insufficient information to code with codable children: Secondary | ICD-10-CM | POA: Insufficient documentation

## 2012-11-12 DIAGNOSIS — I10 Essential (primary) hypertension: Secondary | ICD-10-CM | POA: Insufficient documentation

## 2012-11-12 MED ORDER — DEXAMETHASONE 4 MG PO TABS: 4.0000 mg | ORAL_TABLET | Freq: Two times a day (BID) | ORAL | Status: DC

## 2012-11-12 MED ORDER — CIPROFLOXACIN HCL 500 MG PO TABS: 500.0000 mg | ORAL_TABLET | Freq: Two times a day (BID) | ORAL | Status: DC

## 2012-11-12 MED ORDER — HYDROXYZINE HCL 25 MG PO TABS: 25.0000 mg | ORAL_TABLET | Freq: Four times a day (QID) | ORAL | Status: DC

## 2012-11-12 NOTE — ED Provider Notes (Signed)
CSN: [id number]     Arrival date & time 11/12/12  1954 History   First MD Initiated Contact with Patient 11/12/12 2018     Chief Complaint  Patient presents with  . Rash   (Consider location/radiation/quality/duration/timing/severity/associated sxs/prior Treatment) Patient is a 47 y.o. male presenting with rash. The history is provided by the patient.  Rash Location:  Head/neck and leg Head/neck rash location:  L neck Leg rash location:  L hip, R hip, L leg and R leg Quality: blistering and itchiness   Severity:  Moderate Onset quality:  Gradual Duration:  3 days Timing:  Constant Progression:  Worsening Chronicity:  New Context: plant contact   Context: not animal contact, not chemical exposure, not exposure to similar rash and not hot tub use   Relieved by:  Nothing Worsened by:  Nothing tried Associated symptoms: no abdominal pain, no fever, no headaches, no joint pain, no myalgias, no nausea, no shortness of breath, no throat swelling, no tongue swelling and not wheezing     Past Medical History  Diagnosis Date  . Hypertension   . Discoid lupus    Past Surgical History  Procedure Laterality Date  . Elbow surgery      left   History reviewed. No pertinent family history. History  Substance Use Topics  . Smoking status: Current Every Day Smoker -- 1.00 packs/day for 20 years    Types: Cigarettes  . Smokeless tobacco: Never Used  . Alcohol Use: 30.0 oz/week    50 Cans of beer per week    Review of Systems  Constitutional: Negative for fever and activity change.       All ROS Neg except as noted in HPI  HENT: Negative for nosebleeds and neck pain.   Eyes: Negative for photophobia and discharge.  Respiratory: Negative for cough, shortness of breath and wheezing.   Cardiovascular: Negative for chest pain and palpitations.  Gastrointestinal: Negative for nausea, abdominal pain and blood in stool.  Genitourinary: Negative for dysuria, frequency and hematuria.   Musculoskeletal: Negative for myalgias, back pain and arthralgias.  Skin: Positive for rash.  Neurological: Negative for dizziness, seizures, speech difficulty and headaches.  Psychiatric/Behavioral: Negative for hallucinations and confusion.    Allergies  Bee venom and Penicillins  Home Medications   Current Outpatient Rx  Name  Route  Sig  Dispense  Refill  . propranolol (INDERAL) 40 MG tablet   Oral   Take 40 mg by mouth daily.         . ciprofloxacin (CIPRO) 500 MG tablet   Oral   Take 1 tablet (500 mg total) by mouth 2 (two) times daily.   14 tablet   0   . dexamethasone (DECADRON) 4 MG tablet   Oral   Take 1 tablet (4 mg total) by mouth 2 (two) times daily with a meal.   12 tablet   0   . hydrOXYzine (ATARAX/VISTARIL) 25 MG tablet   Oral   Take 1 tablet (25 mg total) by mouth every 6 (six) hours.   20 tablet   0    BP 103/77  Pulse 73  Temp(Src) 98.8 F (37.1 C) (Oral)  Resp 20  Ht 6' 1.5" (1.867 m)  Wt 178 lb (80.74 kg)  BMI 23.16 kg/m2  SpO2 100% Physical Exam  Nursing note and vitals reviewed. Constitutional: He is oriented to person, place, and time. He appears well-developed and well-nourished.  Non-toxic appearance.  HENT:  Head: Normocephalic.  Right Ear: Tympanic membrane  and external ear normal.  Left Ear: Tympanic membrane and external ear normal.  Eyes: EOM and lids are normal. Pupils are equal, round, and reactive to light.  Neck: Normal range of motion. Neck supple. Carotid bruit is not present.  Cardiovascular: Normal rate, regular rhythm, normal heart sounds, intact distal pulses and normal pulses.   Pulmonary/Chest: Breath sounds normal. No respiratory distress.  Abdominal: Soft. Bowel sounds are normal. There is no tenderness. There is no guarding.  Musculoskeletal: Normal range of motion.  Lymphadenopathy:       Head (right side): No submandibular adenopathy present.       Head (left side): No submandibular adenopathy present.     He has no cervical adenopathy.  Neurological: He is alert and oriented to person, place, and time. He has normal strength. No cranial nerve deficit or sensory deficit.  Skin: Skin is warm and dry. Rash noted.  Rash with blisters both lower legs and thighs. Rash and back and left neck.  Psychiatric: He has a normal mood and affect. His speech is normal.    ED Course  Procedures (including critical care time) Labs Review Labs Reviewed - No data to display Imaging Review No results found.  MDM   1. Rash    *I have reviewed nursing notes, vital signs, and all appropriate lab and imaging results for this patient.  Rx for decadron, vistaril, and cipro given to the patient. Pt to see Dr Margo Aye or the dermatologist of his choice.  Kathie Dike, PA-C 11/12/12 2041

## 2012-11-12 NOTE — ED Notes (Signed)
Rash to legs,trunk, for 3 days, Itches.

## 2012-11-12 NOTE — ED Provider Notes (Signed)
Medical screening examination/treatment/procedure(s) were performed by non-physician practitioner and as supervising physician I was immediately available for consultation/collaboration.   Nija Koopman L Massiah Minjares, MD 11/12/12 2258 

## 2015-03-21 ENCOUNTER — Encounter (HOSPITAL_COMMUNITY): Payer: Self-pay | Admitting: Cardiology

## 2015-03-21 ENCOUNTER — Inpatient Hospital Stay (HOSPITAL_COMMUNITY)
Admission: EM | Admit: 2015-03-21 | Discharge: 2015-03-27 | DRG: 871 | Disposition: A | Discharge status: Home / Self Care | Payer: Medicaid Other | Attending: Internal Medicine | Admitting: Internal Medicine

## 2015-03-21 ENCOUNTER — Emergency Department (HOSPITAL_COMMUNITY): Payer: Medicaid Other

## 2015-03-21 DIAGNOSIS — A419 Sepsis, unspecified organism: Secondary | ICD-10-CM | POA: Diagnosis present

## 2015-03-21 DIAGNOSIS — R0602 Shortness of breath: Secondary | ICD-10-CM

## 2015-03-21 DIAGNOSIS — R509 Fever, unspecified: Secondary | ICD-10-CM | POA: Diagnosis not present

## 2015-03-21 DIAGNOSIS — J9601 Acute respiratory failure with hypoxia: Secondary | ICD-10-CM | POA: Diagnosis not present

## 2015-03-21 DIAGNOSIS — A403 Sepsis due to Streptococcus pneumoniae: Secondary | ICD-10-CM | POA: Diagnosis not present

## 2015-03-21 DIAGNOSIS — J189 Pneumonia, unspecified organism: Secondary | ICD-10-CM | POA: Diagnosis not present

## 2015-03-21 DIAGNOSIS — Z789 Other specified health status: Secondary | ICD-10-CM | POA: Diagnosis not present

## 2015-03-21 DIAGNOSIS — I071 Rheumatic tricuspid insufficiency: Secondary | ICD-10-CM | POA: Diagnosis present

## 2015-03-21 DIAGNOSIS — L930 Discoid lupus erythematosus: Secondary | ICD-10-CM | POA: Diagnosis present

## 2015-03-21 DIAGNOSIS — I5033 Acute on chronic diastolic (congestive) heart failure: Secondary | ICD-10-CM | POA: Diagnosis present

## 2015-03-21 DIAGNOSIS — D649 Anemia, unspecified: Secondary | ICD-10-CM | POA: Diagnosis not present

## 2015-03-21 DIAGNOSIS — R739 Hyperglycemia, unspecified: Secondary | ICD-10-CM | POA: Diagnosis not present

## 2015-03-21 DIAGNOSIS — F17210 Nicotine dependence, cigarettes, uncomplicated: Secondary | ICD-10-CM | POA: Diagnosis not present

## 2015-03-21 DIAGNOSIS — J13 Pneumonia due to Streptococcus pneumoniae: Secondary | ICD-10-CM | POA: Diagnosis not present

## 2015-03-21 DIAGNOSIS — N179 Acute kidney failure, unspecified: Secondary | ICD-10-CM | POA: Diagnosis present

## 2015-03-21 DIAGNOSIS — I10 Essential (primary) hypertension: Secondary | ICD-10-CM | POA: Diagnosis present

## 2015-03-21 DIAGNOSIS — D696 Thrombocytopenia, unspecified: Secondary | ICD-10-CM | POA: Diagnosis not present

## 2015-03-21 DIAGNOSIS — E872 Acidosis: Secondary | ICD-10-CM | POA: Diagnosis not present

## 2015-03-21 DIAGNOSIS — E871 Hypo-osmolality and hyponatremia: Secondary | ICD-10-CM | POA: Diagnosis not present

## 2015-03-21 DIAGNOSIS — I248 Other forms of acute ischemic heart disease: Secondary | ICD-10-CM | POA: Diagnosis not present

## 2015-03-21 DIAGNOSIS — R0600 Dyspnea, unspecified: Secondary | ICD-10-CM | POA: Diagnosis not present

## 2015-03-21 DIAGNOSIS — R0781 Pleurodynia: Secondary | ICD-10-CM

## 2015-03-21 LAB — CBC WITH DIFFERENTIAL/PLATELET
BASOS ABS: 0 10*3/uL (ref 0.0–0.1)
Basophils Relative: 0 %
EOS ABS: 0 10*3/uL (ref 0.0–0.7)
EOS PCT: 0 %
HCT: 38.1 % — ABNORMAL LOW (ref 39.0–52.0)
Hemoglobin: 12.7 g/dL — ABNORMAL LOW (ref 13.0–17.0)
Lymphocytes Relative: 13 %
Lymphs Abs: 0.7 10*3/uL (ref 0.7–4.0)
MCH: 30.5 pg (ref 26.0–34.0)
MCHC: 33.3 g/dL (ref 30.0–36.0)
MCV: 91.4 fL (ref 78.0–100.0)
Monocytes Absolute: 0 10*3/uL — ABNORMAL LOW (ref 0.1–1.0)
Monocytes Relative: 0 %
NEUTROS PCT: 87 %
Neutro Abs: 4.6 10*3/uL (ref 1.7–7.7)
PLATELETS: 151 10*3/uL (ref 150–400)
RBC: 4.17 MIL/uL — AB (ref 4.22–5.81)
RDW: 12.5 % (ref 11.5–15.5)
WBC: 5.3 10*3/uL (ref 4.0–10.5)

## 2015-03-21 LAB — COMPREHENSIVE METABOLIC PANEL
ALK PHOS: 49 U/L (ref 38–126)
AST: 68 U/L — AB (ref 15–41)
Albumin: 3.4 g/dL — ABNORMAL LOW (ref 3.5–5.0)
Anion gap: 14 (ref 5–15)
BUN: 22 mg/dL — AB (ref 6–20)
CALCIUM: 8.5 mg/dL — AB (ref 8.9–10.3)
CHLORIDE: 101 mmol/L (ref 101–111)
CO2: 21 mmol/L — AB (ref 22–32)
CREATININE: 1.97 mg/dL — AB (ref 0.61–1.24)
GFR calc non Af Amer: 38 mL/min — ABNORMAL LOW (ref >60)
GFR, EST AFRICAN AMERICAN: 44 mL/min — AB (ref >60)
GLUCOSE: 161 mg/dL — AB (ref 65–99)
Potassium: 3.5 mmol/L (ref 3.5–5.1)
SODIUM: 136 mmol/L (ref 135–145)
Total Bilirubin: 0.8 mg/dL (ref 0.3–1.2)
Total Protein: 7.2 g/dL (ref 6.5–8.1)

## 2015-03-21 LAB — URINALYSIS, ROUTINE W REFLEX MICROSCOPIC
BILIRUBIN URINE: NEGATIVE
GLUCOSE, UA: NEGATIVE mg/dL
Hgb urine dipstick: NEGATIVE
LEUKOCYTES UA: NEGATIVE
NITRITE: NEGATIVE
PROTEIN: 30 mg/dL — AB
Specific Gravity, Urine: >1.03 — ABNORMAL HIGH (ref 1.005–1.030)
pH: 5 (ref 5.0–8.0)

## 2015-03-21 LAB — URINE MICROSCOPIC-ADD ON

## 2015-03-21 LAB — BLOOD GAS, ARTERIAL
Acid-base deficit: 5.2 mmol/L — ABNORMAL HIGH (ref 0.0–2.0)
BICARBONATE: 19 meq/L — AB (ref 20.0–24.0)
Drawn by: 105551
O2 CONTENT: 3 L/min
O2 Saturation: 27.5 %
PCO2 ART: 36.7 mmHg (ref 35.0–45.0)
PO2 ART: 21.7 mmHg — AB (ref 80.0–100.0)
pH, Arterial: 7.344 — ABNORMAL LOW (ref 7.350–7.450)

## 2015-03-21 LAB — TROPONIN I: Troponin I: 0.04 ng/mL — ABNORMAL HIGH (ref <0.031)

## 2015-03-21 LAB — LACTIC ACID, PLASMA: Lactic Acid, Venous: 6.7 mmol/L (ref 0.5–2.0)

## 2015-03-21 MED ORDER — LEVOFLOXACIN IN D5W 750 MG/150ML IV SOLN
750.0000 mg | Freq: Once | INTRAVENOUS | Status: AC
  Administered 2015-03-21: 750 mg via INTRAVENOUS
  Filled 2015-03-21: qty 150

## 2015-03-21 MED ORDER — SODIUM CHLORIDE 0.9 % IV BOLUS (SEPSIS)
1000.0000 mL | INTRAVENOUS | Status: AC
  Administered 2015-03-21 (×3): 1000 mL via INTRAVENOUS

## 2015-03-21 MED ORDER — ACETAMINOPHEN 500 MG PO TABS
500.0000 mg | ORAL_TABLET | Freq: Once | ORAL | Status: AC
  Administered 2015-03-21: 500 mg via ORAL
  Filled 2015-03-21: qty 1

## 2015-03-21 MED ORDER — ALBUTEROL SULFATE (2.5 MG/3ML) 0.083% IN NEBU
2.5000 mg | INHALATION_SOLUTION | Freq: Once | RESPIRATORY_TRACT | Status: AC
  Administered 2015-03-21: 2.5 mg via RESPIRATORY_TRACT
  Filled 2015-03-21: qty 3

## 2015-03-21 MED ORDER — ACETAMINOPHEN 325 MG PO TABS
ORAL_TABLET | ORAL | Status: AC
  Filled 2015-03-21: qty 2

## 2015-03-21 MED ORDER — IBUPROFEN 400 MG PO TABS
400.0000 mg | ORAL_TABLET | Freq: Once | ORAL | Status: AC
  Administered 2015-03-21: 400 mg via ORAL
  Filled 2015-03-21: qty 1

## 2015-03-21 MED ORDER — KETOROLAC TROMETHAMINE 30 MG/ML IJ SOLN
30.0000 mg | Freq: Once | INTRAMUSCULAR | Status: AC
  Administered 2015-03-21: 30 mg via INTRAVENOUS
  Filled 2015-03-21: qty 1

## 2015-03-21 MED ORDER — ACETAMINOPHEN 325 MG PO TABS
650.0000 mg | ORAL_TABLET | Freq: Once | ORAL | Status: AC
  Administered 2015-03-21: 650 mg via ORAL

## 2015-03-21 NOTE — ED Notes (Signed)
C/o generalized pain, weakness, chills  and chest congestion.   Temp 101.1 with EMS.

## 2015-03-21 NOTE — ED Provider Notes (Signed)
CSN: [id number]     Arrival date & time 03/21/15  1941 History   First MD Initiated Contact with Patient 03/21/15 1951     Chief Complaint  Patient presents with  . Fever     (Consider location/radiation/quality/duration/timing/severity/associated sxs/prior Treatment) HPI Comments: Patient is a [age] year old male who presents to the emergency department with complaint of fever and body aches.  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 He states that earlier this week he has noticed that he has been having coughing and sneezing. He later noted that he had fatigue and shortness of breath with getting around the house. Today he noted fever, chills, weakness, and generalized body aches. He states that it got to a point that he just didn't feel like he could handle it anymore, and he called EMS. He denies any hemoptysis, but states he is coughing up bright yellow phlegm. He has not noticed any unusual rash. He's not had diarrhea or vomiting. The patient is not been out of the country recently.  The history is provided by the patient and the EMS personnel.    Past Medical History  Diagnosis Date  . Hypertension   . Discoid lupus    Past Surgical History  Procedure Laterality Date  . Elbow surgery      left   History reviewed. No pertinent family history. Social History  Substance Use Topics  . Smoking status: Current Every Day Smoker -- 1.00 packs/day for 20 years    Types: Cigarettes  . Smokeless tobacco: Never Used  . Alcohol Use: 30.0 oz/week    50 Cans of beer per week     Comment: 4-5 beers a day.     Review of Systems  Constitutional: Positive for fever, chills, activity change, appetite change and fatigue.       All ROS Neg except as noted in HPI  HENT: Positive for congestion, sinus pressure and sneezing. Negative for nosebleeds.   Eyes: Negative for photophobia and discharge.  Respiratory: Positive for cough, chest tightness, shortness of  breath and wheezing.   Cardiovascular: Negative for chest pain, palpitations and leg swelling.  Gastrointestinal: Negative for abdominal pain, diarrhea and blood in stool.  Endocrine: Negative for polyuria.  Genitourinary: Negative for dysuria, frequency and hematuria.  Musculoskeletal: Negative for back pain, joint swelling, arthralgias, neck pain and neck stiffness.  Skin: Negative.  Negative for rash.  Neurological: Negative for dizziness, seizures and speech difficulty.  Psychiatric/Behavioral: Negative for hallucinations and confusion.      Allergies  Bee venom and Penicillins  Home Medications   Prior to Admission medications   Medication Sig Start Date End Date Taking? Authorizing Provider  ciprofloxacin (CIPRO) 500 MG tablet Take 1 tablet (500 mg total) by mouth 2 (two) times daily. 11/12/12   Ivery QualeHobson Rithvik Orcutt, PA-C  dexamethasone (DECADRON) 4 MG tablet Take 1 tablet (4 mg total) by mouth 2 (two) times daily with a meal. 11/12/12   Ivery QualeHobson Deondrick Searls, PA-C  hydrOXYzine (ATARAX/VISTARIL) 25 MG tablet Take 1 tablet (25 mg total) by mouth every 6 (six) hours. 11/12/12   Ivery QualeHobson Khaylee Mcevoy, PA-C  propranolol (INDERAL) 40 MG tablet Take 40 mg by mouth daily.    Historical Provider, MD   BP 97/84 mmHg  Pulse 109  Temp(Src) 103.1 F (39.5 C) (Rectal)  Resp 24  Ht 6\' 1"  (1.854 m)  Wt 85.73 kg  BMI 24.94 kg/m2  SpO2 96% Physical Exam  Constitutional: He is oriented to person, place, and time.  He appears well-developed and well-nourished.  Non-toxic appearance. He appears distressed.  HENT:  Head: Normocephalic.  Right Ear: Tympanic membrane and external ear normal.  Left Ear: Tympanic membrane and external ear normal.  Mucous membranes are dry. Airway is patent.  Eyes: EOM and lids are normal. Pupils are equal, round, and reactive to light.  Neck: Normal range of motion. Neck supple. Carotid bruit is not present.  Cardiovascular: Regular rhythm, normal heart sounds, intact distal pulses and  normal pulses.  Tachycardia present.   Tachycardia of 110.  Pulmonary/Chest: Tachypnea noted. No respiratory distress. He has wheezes. He has rhonchi.  Respiratory rate of 40/m with some use of the sensory muscles.  Abdominal: Soft. Bowel sounds are normal. There is no tenderness. There is no guarding.  Musculoskeletal: Normal range of motion. He exhibits no edema.  No hot joints appreciated.  Lymphadenopathy:       Head (right side): No submandibular adenopathy present.       Head (left side): No submandibular adenopathy present.    He has no cervical adenopathy.  Neurological: He is alert and oriented to person, place, and time. He has normal strength. No cranial nerve deficit or sensory deficit. He exhibits normal muscle tone. Coordination normal.  Skin: Skin is warm and dry. No rash noted.  Psychiatric: He has a normal mood and affect. His speech is normal.  Nursing note and vitals reviewed.   ED Course  Procedures (including critical care time) CRITICAL CARE Performed by: Kathie Dike Total critical care time: *40** minutes Critical care time was exclusive of separately billable procedures and treating other patients. Critical care was necessary to treat or prevent imminent or life-threatening deterioration. Critical care was time spent personally by me on the following activities: development of treatment plan with patient and/or surrogate as well as nursing, discussions with consultants, evaluation of patient's response to treatment, examination of patient, obtaining history from patient or surrogate, ordering and performing treatments and interventions, ordering and review of laboratory studies, ordering and review of radiographic studies, pulse oximetry and re-evaluation of patient's condition. Labs Review Labs Reviewed  CULTURE, BLOOD (ROUTINE X 2)  CULTURE, BLOOD (ROUTINE X 2)  URINE CULTURE  COMPREHENSIVE METABOLIC PANEL  CBC WITH DIFFERENTIAL/PLATELET  URINALYSIS,  ROUTINE W REFLEX MICROSCOPIC (NOT AT [facility])  LACTIC ACID, PLASMA  LACTIC ACID, PLASMA  TROPONIN I  BLOOD GAS, ARTERIAL  I-STAT CG4 LACTIC ACID, ED    Imaging Review No results found. I have personally reviewed and evaluated these images and lab results as part of my medical decision-making.   EKG Interpretation   Date/Time:  Saturday March 21 2015 19:50:11 EST Ventricular Rate:  111 PR Interval:  181 QRS Duration: 86 QT Interval:  316 QTC Calculation: 429 R Axis:   76 Text Interpretation:  Sinus tachycardia Probable left atrial enlargement  Probable left ventricular hypertrophy Borderline T abnormalities, lateral  leads ST elevation, consider inferior injury since last tracing no  significant change Confirmed by Effie Shy  MD, ELLIOTT ([id number]) on 03/21/2015  7:59:37 PM      MDM  The rectal temp is 103.1. The heart rate is 110, count. The blood pressure is 91-97 systolic. The respiratory rate is 40/m. Code Sepsis called. Pulse oximetry went down to 87 during my examination. Patient placed on 3 L of oxygen by nasal cannula with improvement to a pulse oximetry of 96-97%.   Blood pressure improved to 123 systolic. Patient breathing easier after albuterol nebulizer.  Lactic acid elevated at 6.7. Compress  metabolic panel shows the CO2 to be low at 21, the BUN is 22 the creatinine is 1.97 both elevated. AST is also elevated at 68. Troponin I is elevated at 0.04.  lactic acid of 6.7.  Chest x-ray shows bilateral pneumonia.  Call placed to critical care intensivist. Dr. Sherene Sires will Admit patient to ICU, [facility].   Final diagnoses:  Sepsis, due to unspecified organism [facility])  Community acquired pneumonia    **I have reviewed nursing notes, vital signs, and all appropriate lab and imaging results for this patient.Ivery Quale, PA-C 03/22/15 1856  Mancel Bale, MD 03/23/15 1150

## 2015-03-21 NOTE — Progress Notes (Signed)
eLink Physician-Brief Progress Note Patient Name: Jim PotashLarry L Gordon DOB: 09/12/1965 MRN: [id number]   Date of Service  03/21/2015  HPI/Events of Note  Patient requires repeat Lactic Acid for Sepsis Protocol.   eICU Interventions  Will order a Lactic Acid level now.      Intervention Category Intermediate Interventions: Abdominal pain - evaluation and management;Best-practice therapies (e.g. DVT, beta blocker, etc.) Minor Interventions: Clinical assessment - ordering diagnostic tests  [name],[name] 03/21/2015, 11:44 PM

## 2015-03-21 NOTE — ED Notes (Signed)
CRITICAL VALUE ALERT  Critical value received:  Lactic acid 6.7  Date of notification:  03/21/2015  Time of notification:  2111  Critical value read back:Yes.    Nurse who received alert:  Bronson CurbKristy Ashiya Kinkead, RN  MD notified (1st page):  hobson bryant, pa  Time of first page:  2111  MD notified (2nd page):  Time of second page:  Responding MD:  Ivery Qualehobson bryant, PA  Time MD responded:  2111

## 2015-03-21 NOTE — ED Notes (Signed)
sats in the upper 80's.  Placed on 3 liters [place].

## 2015-03-21 NOTE — ED Notes (Signed)
C/o generalized pain.  Wants something for pain.  Dr. Effie ShyWentz notified.  Orders received.

## 2015-03-22 ENCOUNTER — Inpatient Hospital Stay (HOSPITAL_COMMUNITY): Payer: Medicaid Other

## 2015-03-22 ENCOUNTER — Encounter (HOSPITAL_COMMUNITY): Payer: Self-pay | Admitting: Pulmonary Disease

## 2015-03-22 DIAGNOSIS — Z789 Other specified health status: Secondary | ICD-10-CM

## 2015-03-22 DIAGNOSIS — A419 Sepsis, unspecified organism: Secondary | ICD-10-CM

## 2015-03-22 DIAGNOSIS — J13 Pneumonia due to Streptococcus pneumoniae: Secondary | ICD-10-CM

## 2015-03-22 DIAGNOSIS — J189 Pneumonia, unspecified organism: Secondary | ICD-10-CM | POA: Diagnosis present

## 2015-03-22 DIAGNOSIS — N179 Acute kidney failure, unspecified: Secondary | ICD-10-CM

## 2015-03-22 LAB — RENAL FUNCTION PANEL
Albumin: 2.7 g/dL — ABNORMAL LOW (ref 3.5–5.0)
Anion gap: 10 (ref 5–15)
BUN: 23 mg/dL — AB (ref 6–20)
CHLORIDE: 104 mmol/L (ref 101–111)
CO2: 20 mmol/L — AB (ref 22–32)
Calcium: 7.7 mg/dL — ABNORMAL LOW (ref 8.9–10.3)
Creatinine, Ser: 1.67 mg/dL — ABNORMAL HIGH (ref 0.61–1.24)
GFR calc Af Amer: 54 mL/min — ABNORMAL LOW (ref >60)
GFR calc non Af Amer: 46 mL/min — ABNORMAL LOW (ref >60)
GLUCOSE: 103 mg/dL — AB (ref 65–99)
POTASSIUM: 4.4 mmol/L (ref 3.5–5.1)
Phosphorus: 2.5 mg/dL (ref 2.5–4.6)
Sodium: 134 mmol/L — ABNORMAL LOW (ref 135–145)

## 2015-03-22 LAB — CBC WITH DIFFERENTIAL/PLATELET
BASOS ABS: 0 10*3/uL (ref 0.0–0.1)
Basophils Relative: 0 %
EOS PCT: 0 %
Eosinophils Absolute: 0 10*3/uL (ref 0.0–0.7)
HCT: 35.1 % — ABNORMAL LOW (ref 39.0–52.0)
HEMOGLOBIN: 12 g/dL — AB (ref 13.0–17.0)
LYMPHS ABS: 0.6 10*3/uL — AB (ref 0.7–4.0)
Lymphocytes Relative: 15 %
MCH: 30.6 pg (ref 26.0–34.0)
MCHC: 34.2 g/dL (ref 30.0–36.0)
MCV: 89.5 fL (ref 78.0–100.0)
MONOS PCT: 2 %
Monocytes Absolute: 0.1 10*3/uL (ref 0.1–1.0)
NEUTROS ABS: 3 10*3/uL (ref 1.7–7.7)
Neutrophils Relative %: 83 %
PLATELETS: 128 10*3/uL — AB (ref 150–400)
RBC: 3.92 MIL/uL — AB (ref 4.22–5.81)
RDW: 12.6 % (ref 11.5–15.5)
WBC MORPHOLOGY: INCREASED
WBC: 3.7 10*3/uL — ABNORMAL LOW (ref 4.0–10.5)

## 2015-03-22 LAB — LACTIC ACID, PLASMA
LACTIC ACID, VENOUS: 2.4 mmol/L — AB (ref 0.5–2.0)
Lactic Acid, Venous: 2.4 mmol/L (ref 0.5–2.0)

## 2015-03-22 LAB — GLUCOSE, CAPILLARY
GLUCOSE-CAPILLARY: 123 mg/dL — AB (ref 65–99)
GLUCOSE-CAPILLARY: 86 mg/dL (ref 65–99)
GLUCOSE-CAPILLARY: 74 mg/dL (ref 65–99)
GLUCOSE-CAPILLARY: 95 mg/dL (ref 65–99)
GLUCOSE-CAPILLARY: 119 mg/dL — AB (ref 65–99)
Glucose-Capillary: 113 mg/dL — ABNORMAL HIGH (ref 65–99)
Glucose-Capillary: 85 mg/dL (ref 65–99)

## 2015-03-22 LAB — INFLUENZA PANEL BY PCR (TYPE A & B)
H1N1FLUPCR: NOT DETECTED
INFLAPCR: NEGATIVE
Influenza B By PCR: NEGATIVE

## 2015-03-22 LAB — TROPONIN I
TROPONIN I: 0.04 ng/mL — AB (ref <0.031)
Troponin I: 0.04 ng/mL — ABNORMAL HIGH (ref <0.031)
Troponin I: 0.12 ng/mL — ABNORMAL HIGH (ref <0.031)

## 2015-03-22 LAB — SEDIMENTATION RATE: SED RATE: 26 mm/h — AB (ref 0–16)

## 2015-03-22 LAB — RAPID URINE DRUG SCREEN, HOSP PERFORMED
AMPHETAMINES: NOT DETECTED
Barbiturates: NOT DETECTED
Benzodiazepines: NOT DETECTED
Cocaine: NOT DETECTED
OPIATES: NOT DETECTED
Tetrahydrocannabinol: NOT DETECTED

## 2015-03-22 LAB — MRSA PCR SCREENING: MRSA by PCR: NEGATIVE

## 2015-03-22 LAB — C-REACTIVE PROTEIN: CRP: 15.9 mg/dL — ABNORMAL HIGH (ref <1.0)

## 2015-03-22 LAB — STREP PNEUMONIAE URINARY ANTIGEN: Strep Pneumo Urinary Antigen: POSITIVE — AB

## 2015-03-22 LAB — MAGNESIUM: MAGNESIUM: 1.1 mg/dL — AB (ref 1.7–2.4)

## 2015-03-22 LAB — ETHANOL

## 2015-03-22 LAB — PROCALCITONIN

## 2015-03-22 MED ORDER — FOLIC ACID 1 MG PO TABS
1.0000 mg | ORAL_TABLET | Freq: Every day | ORAL | Status: DC
  Administered 2015-03-22 – 2015-03-27 (×6): 1 mg via ORAL
  Filled 2015-03-22 (×6): qty 1

## 2015-03-22 MED ORDER — VANCOMYCIN HCL IN DEXTROSE 750-5 MG/150ML-% IV SOLN
750.0000 mg | Freq: Two times a day (BID) | INTRAVENOUS | Status: DC
  Administered 2015-03-22 – 2015-03-26 (×9): 750 mg via INTRAVENOUS
  Filled 2015-03-22 (×11): qty 150

## 2015-03-22 MED ORDER — ADULT MULTIVITAMIN W/MINERALS CH
1.0000 | ORAL_TABLET | Freq: Every day | ORAL | Status: DC
  Administered 2015-03-22 – 2015-03-27 (×6): 1 via ORAL
  Filled 2015-03-22 (×6): qty 1

## 2015-03-22 MED ORDER — SODIUM CHLORIDE 0.9 % IV SOLN
INTRAVENOUS | Status: DC
  Administered 2015-03-22: 1000 mL via INTRAVENOUS

## 2015-03-22 MED ORDER — HEPARIN SODIUM (PORCINE) 5000 UNIT/ML IJ SOLN
5000.0000 [IU] | Freq: Three times a day (TID) | INTRAMUSCULAR | Status: DC
  Administered 2015-03-22 – 2015-03-27 (×16): 5000 [IU] via SUBCUTANEOUS
  Filled 2015-03-22 (×17): qty 1

## 2015-03-22 MED ORDER — SODIUM CHLORIDE 0.9 % IV SOLN: 250.0000 mL | INTRAVENOUS | Status: DC | PRN

## 2015-03-22 MED ORDER — LORAZEPAM 2 MG/ML IJ SOLN: 1.0000 mg | INTRAMUSCULAR | Status: DC | PRN

## 2015-03-22 MED ORDER — CETYLPYRIDINIUM CHLORIDE 0.05 % MT LIQD
7.0000 mL | Freq: Two times a day (BID) | OROMUCOSAL | Status: DC
  Administered 2015-03-22 – 2015-03-25 (×6): 7 mL via OROMUCOSAL

## 2015-03-22 MED ORDER — INSULIN ASPART 100 UNIT/ML ~~LOC~~ SOLN: 0.0000 [IU] | Freq: Every day | SUBCUTANEOUS | Status: DC

## 2015-03-22 MED ORDER — LEVOFLOXACIN IN D5W 750 MG/150ML IV SOLN
750.0000 mg | INTRAVENOUS | Status: DC
  Administered 2015-03-22 – 2015-03-25 (×4): 750 mg via INTRAVENOUS
  Filled 2015-03-22 (×6): qty 150

## 2015-03-22 MED ORDER — INSULIN ASPART 100 UNIT/ML ~~LOC~~ SOLN
0.0000 [IU] | Freq: Three times a day (TID) | SUBCUTANEOUS | Status: DC
  Administered 2015-03-23: 2 [IU] via SUBCUTANEOUS
  Administered 2015-03-24: 3 [IU] via SUBCUTANEOUS
  Administered 2015-03-24: 5 [IU] via SUBCUTANEOUS
  Administered 2015-03-26 – 2015-03-27 (×2): 2 [IU] via SUBCUTANEOUS

## 2015-03-22 MED ORDER — INSULIN ASPART 100 UNIT/ML ~~LOC~~ SOLN: 0.0000 [IU] | SUBCUTANEOUS | Status: DC

## 2015-03-22 MED ORDER — LORAZEPAM 2 MG/ML IJ SOLN
1.0000 mg | INTRAMUSCULAR | Status: DC | PRN
  Administered 2015-03-24: 1 mg via INTRAVENOUS
  Filled 2015-03-22: qty 1

## 2015-03-22 MED ORDER — LACTATED RINGERS IV SOLN
INTRAVENOUS | Status: DC
  Administered 2015-03-22 – 2015-03-24 (×5): via INTRAVENOUS

## 2015-03-22 MED ORDER — VITAMIN B-1 100 MG PO TABS
100.0000 mg | ORAL_TABLET | Freq: Every day | ORAL | Status: DC
  Administered 2015-03-22 – 2015-03-27 (×6): 100 mg via ORAL
  Filled 2015-03-22 (×6): qty 1

## 2015-03-22 MED ORDER — MAGNESIUM SULFATE 2 GM/50ML IV SOLN
2.0000 g | Freq: Once | INTRAVENOUS | Status: AC
  Administered 2015-03-22: 2 g via INTRAVENOUS
  Filled 2015-03-22: qty 50

## 2015-03-22 MED ORDER — ACETAMINOPHEN 500 MG PO TABS
500.0000 mg | ORAL_TABLET | Freq: Four times a day (QID) | ORAL | Status: DC | PRN
  Administered 2015-03-22 – 2015-03-24 (×6): 500 mg via ORAL
  Filled 2015-03-22 (×6): qty 1

## 2015-03-22 NOTE — Progress Notes (Signed)
eLink Physician-Brief Progress Note Patient Name: Jim Gordon DOB: 08/15/1965 MRN: [id number]   Date of Service  03/22/2015  HPI/Events of Note  Patient has hx of Discoid Lupus and ETOH abuse. Allergy to Penicillin. Currently on Levaquin.   eICU Interventions  Will add Vancomycin per Pharmacy Consultation.      Intervention Category Intermediate Interventions: Infection - evaluation and management  Tyresse Jayson Eugene 03/22/2015, 1:08 AM

## 2015-03-22 NOTE — H&P (Signed)
PULMONARY / CRITICAL CARE MEDICINE   Name: Jim Gordon MRN: [id number] DOB: [birth year]    ADMISSION DATE:  03/21/2015 CONSULTATION DATE:  03/22/2015  REFERRING MD:  Lily Kocher, P.A. / [facility] Na EDP  CHIEF COMPLAINT:  Bilateral Pneumonia  STUDIES:  TTE 5/16/3:  LV w/ normal wall motion, mild LVH, & EF 65%. LA normal in size. RV grossly normal. Aortic valve grossly normal. Trivial mitral regurg. Trivial tricuspid regrug. No pericardial effusion. Port CXR 12/31:  Personally reviewed by me. Bilateral lower lobe opacities. No pleural effusion appreciated.  MICROBIOLOGY: Blood Ctx x2 12/31 ([facility])>>> Urine Ctx 12/31 ([facility])>>>  ANTIBIOTICS: Vancomycin 1/1>>> Levaquin 12/31>>>  SIGNIFICANT EVENTS: 12/31 - Presented to [facility] ED 01/01 - Transfer to [facility]  LINES/TUBES: PIV x2  HISTORY OF PRESENT ILLNESS:  Per outside records patient presented with fever and body aches starting earlier this week with a cough & sneezing. He endorsed increased dyspnea and fatigue as well. Starting 12/31 he noted fever and chills. He endorsed a cough productive of bright yellow phlegm. At the time he was not having any diarrhea or emesis. On arrival the patient was found to have a rectal temperature of 103.53F and hypoxia on room air to 87%. His lactic acid was elevated at 6.7. The patient denies any prior sore throat or sinus congestion for me at bedside this morning. Now he reports his dyspnea began approximately 2 days ago. She does describe an aching in his chest as well as his joints and diffuse myalgias bilaterally. He reports a minimal cough productive of yellow phlegm. He does endorse subjective fever and chills. Denies any nausea, vomiting, or diarrhea. 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 He denies any recent sick contacts. He did not have an influenza vaccine. The patient has not taken any antibiotics or medications for some time.  PAST MEDICAL HISTORY :  Past Medical History   Diagnosis Date  . Hypertension   . Discoid lupus     PAST SURGICAL HISTORY: Past Surgical History  Procedure Laterality Date  . Elbow surgery      left     Allergies  Allergen Reactions  . Bee Venom Swelling  . Penicillins Hives    HOME MEDICATIONS: Not currently taking any medications.  FAMILY HISTORY:  Family History  Problem Relation Age of Onset  . Diabetes      Father's Side  . Arthritis Mother     SOCIAL HISTORY: Social History   Social History  . Marital Status: Single    Spouse Name: N/A  . Number of Children: N/A  . Years of Education: N/A   Social History Main Topics  . Smoking status: Current Every Day Smoker -- 1.00 packs/day for 20 years    Types: Cigarettes  . Smokeless tobacco: Never Used  . Alcohol Use: 30.0 oz/week    50 Cans of beer per week     Comment: 4-5 beers a day - No h/o withdrawal but has never stopped drinking either.  . Drug Use: No  . Sexual Activity: Yes    Birth Control/ Protection: None   Other Topics Concern  . None   Social History Narrative   Patient currently reports he is disabled. Denies any pet or mold exposure. No recent travel.    REVIEW OF SYSTEMS:  Patient denies any new rashes. He denies any dysuria or hematuria. A pertinent 14 point review of systems is negative except as per the HPI.  SUBJECTIVE:   VITAL SIGNS:  BP 98/71 mmHg  Pulse 96  Temp(Src) 98.7 F (37.1 C) (Oral)  Resp 26  Ht _0  (1.854 m)  Wt 189 lb (85.73 kg)  BMI 24.94 kg/m2  SpO2 92%  HEMODYNAMICS:    VENTILATOR SETTINGS:    INTAKE / OUTPUT:    PHYSICAL EXAMINATION: General:  Laying in bed eyes closed. No acute distress. Bedside TV on.  Integument:  Warm & dry. Hypopigmented areas noted on face and upper back. Tattoo noted as well. Lymphatics:  No appreciated cervical or supraclavicular lymphadenoapthy. HEENT:  Moist mucus membranes. No oral ulcers. No scleral injection or icterus.  Cardiovascular:  Regular rate. No edema.  No appreciable JVD.  Pulmonary:  Slightly decreased aeration bilateral lung bases. Normal work of breathing on nasal cannula oxygen. Speaking in complete sentences. Abdomen: Soft. Normal bowel sounds. Nondistended. Grossly nontender. Musculoskeletal:  Normal bulk and tone. Hand grip strength 5/5 bilaterally. No joint deformity or effusion appreciated. Neurological:  CN 2-12 grossly in tact. No meningismus. Moving all 4 extremities equally.  Psychiatric:  Mood and affect congruent. Speech normal rhythm, rate & tone.   LABS:  BMET  Recent Labs Lab 03/21/15 2026  NA 136  K 3.5  CL 101  CO2 21*  BUN 22*  CREATININE 1.97*  GLUCOSE 161*    Electrolytes  Recent Labs Lab 03/21/15 2026  CALCIUM 8.5*    CBC  Recent Labs Lab 03/21/15 2026  WBC 5.3  HGB 12.7*  HCT 38.1*  PLT 151    Coag's No results for input(s): APTT, INR in the last 168 hours.  Sepsis Markers  Recent Labs Lab 03/21/15 2027 03/22/15 0125  LATICACIDVEN 6.7* 2.4*    ABG  Recent Labs Lab 03/21/15 2050  PHART 7.344*  PCO2ART 36.7  PO2ART 21.7*    Liver Enzymes  Recent Labs Lab 03/21/15 2026  AST 68*  ALT <5*  ALKPHOS 49  BILITOT 0.8  ALBUMIN 3.4*    Cardiac Enzymes  Recent Labs Lab 03/21/15 2026  TROPONINI 0.04*    Glucose  Recent Labs Lab 03/21/15 2324  GLUCAP 123*    Imaging Dg Chest Port 1 View  03/21/2015  CLINICAL DATA:  50 year old male with chest congestion, cough, shortness of breath and fever. EXAM: PORTABLE CHEST 1 VIEW COMPARISON:  06/09/2012 FINDINGS: Bilateral lower lung airspace disease is compatible with pneumonia. There is no evidence of pleural effusion or pneumothorax. No acute bony abnormalities are noted. IMPRESSION: Bilateral lower lung airspace disease compatible with pneumonia. Radiographic follow-up to resolution is recommended. Electronically Signed   By: Margarette Canada M.D.   On: 03/21/2015 20:48    ASSESSMENT / PLAN:  PULMONARY A: Acute  Hypoxic Respiratory Failure Bilateral Pneumonia  P:   Supplement oxygen to maintain saturation >92% Checking CT Chest w/o to better characterize infiltrates   CARDIOVASCULAR A:  No acute issue. H/O HTN - Not currently on any medications.  P:  Monitor patient on telemetry Vitals per unit protocol  RENAL A:   Acute Renal Failure Lactic Acidosis - Improving.  P:   Monitor UOP Trending Renal Function with daily BUN/Creatinine LR MIVF Repeat lactic acid 0500 hrs.  GASTROINTESTINAL A:   No acute issues.  P:   Nothing by mouth except meds with sips of water  HEMATOLOGIC A:   No acute issues.  P:  Monitoring cell counts daily with CBC Heparin subcutaneous every 8 hours SCDs  INFECTIOUS A:   Sepsis Bilateral Pneumonia  P:   Continuing Levaquin Day #2 Starting Vancomycin  Procalcitonin per algorithm Checking sputum culture Urine Legionella & streptococcal antigens Respiratory viral panel PCR & influenza PCR Droplet isolation  Awaiting urine & blood culture results from [facility].   ENDOCRINE A:   Hyperglycemia - No h/o DM.   P:   Checking hemoglobin A1c Accu-Cheks every 4 hours while nothing by mouth Insulin per low-dose sliding scale algorithm  NEUROLOGIC A:   No acute issues. H/O Heavy EtOH Use - No h/o withdrawal but has never stopped.  P:   Thiamine, Folic Acid, & Multi-Vitamin PO Ativan IV prn CIWA Protocol Checking UDS & EtOH level  RHEUMATOLOGICAL A: H/O Discoid Lupus - Not currently on treatment.  P: Checking total C3 & C4  Checking ESR & CRP   FAMILY  - Updates: No family at bedside.  - Inter-disciplinary family meet or Palliative Care meeting due by:  03/29/15  DISCUSSION:  50 year old male with known history of discoid lupus. Presenting with infection symptoms and bilateral opacities concerning for possible pneumonia. Highly suspicious for viral pneumonitis given myalgias and the fact the patient did not receive influenza  vaccine. He also has mild acute renal failure and I am initiating gentle IV fluid for hydration. Placing patient on CIWA precautions given history of chronic alcohol use and also checking a serum alcohol level and urine drug screen.  I have spent a total of 32 minutes of critical care time this morning caring for the patient and reviewing the patient's electronic medical record.   Sonia Baller Ashok Cordia, M.D. [facility] Pulmonary & Critical Care Pager:  [phone] After 3pm or if no response, call [phone] 03/22/2015, 2:32 AM

## 2015-03-22 NOTE — Progress Notes (Signed)
PULMONARY / CRITICAL CARE MEDICINE   Name: Jim Gordon MRN: [id number] DOB: [birth year]    ADMISSION DATE:  03/21/2015 CONSULTATION DATE:  03/22/2015  REFERRING MD:  Lily Kocher, P.A. / [facility]  CHIEF COMPLAINT:  Bilateral Pneumonia  STUDIES:  TTE 5/16/3:  LV w/ normal wall motion, mild LVH, & EF 65%. LA normal in size. RV grossly normal. Aortic valve grossly normal. Trivial mitral regurg. Trivial tricuspid regrug. No pericardial effusion. Port CXR 12/31:  Personally reviewed by me. Bilateral lower lobe opacities. No pleural effusion appreciated. CT chest 1/1 >>  MICROBIOLOGY: Blood Ctx x2 12/31 ([facility])>>> Urine Ctx 12/31 ([facility])>>> Strep Pneumonia Urine ag >Positive  Legionella urine >>  ANTIBIOTICS: Vancomycin 1/1>>> Levaquin 12/31>>>  SIGNIFICANT EVENTS: 12/31 - Presented to [facility] 01/01 - Transfer to [facility]  LINES/TUBES: PIV x2  HISTORY OF PRESENT ILLNESS:  Per outside records patient presented with fever and body aches starting earlier this week with a cough & sneezing. He endorsed increased dyspnea and fatigue as well. Starting 12/31 he noted fever and chills. He endorsed a cough productive of bright yellow phlegm. At the time he was not having any diarrhea or emesis. On arrival the patient was found to have a rectal temperature of 103.93F and hypoxia on room air to 87%. His lactic acid was elevated at 6.7. The patient denies any prior sore throat or sinus congestion for me at bedside this morning. Now he reports his dyspnea began approximately 2 days ago. She does describe an aching in his chest as well as his joints and diffuse myalgias bilaterally. He reports a minimal cough productive of yellow phlegm. He does endorse subjective fever and chills. Denies any nausea, vomiting, or diarrhea. 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 He denies any recent sick contacts. He did not have an influenza vaccine. The patient has not taken any antibiotics or  medications for some time.   SUBJECTIVE:  Feeling better, wants something to eat  Temp tr down    VITAL SIGNS: BP 106/92 mmHg  Pulse 106  Temp(Src) 99.7 F (37.6 C) (Oral)  Resp 39  Ht '6\' 1"'  (1.854 m)  Wt 82 kg (180 lb 12.4 oz)  BMI 23.86 kg/m2  SpO2 92%  HEMODYNAMICS:    VENTILATOR SETTINGS:    INTAKE / OUTPUT: I/O last 3 completed shifts: In: 954.6 [I.V.:804.6; IV Piggyback:150] Out: 300 [Urine:300]  PHYSICAL EXAMINATION: General:  NAD, following commands  Integument:  Warm & dry. Hypopigmented areas noted on face and upper back. Tattoo noted as well. Lymphatics:  No appreciated cervical or supraclavicular lymphadenoapthy. HEENT:  Moist mucus membranes. No oral ulcers. No scleral injection or icterus.  Cardiovascular:  Regular rate. No edema. No appreciable JVD.  Pulmonary:  Slightly decreased aeration bilateral lung bases. Normal work of breathing on nasal cannula oxygen. Speaking in complete sentences. Abdomen: Soft. Normal bowel sounds. Nondistended. Grossly nontender. Musculoskeletal:  Normal bulk and tone. Hand grip strength 5/5 bilaterally. No joint deformity or effusion appreciated. Neurological: no focal deficits noted . Moving all 4 extremities equally.  Psychiatric:  Mood and affect congruent. Speech normal rhythm, rate & tone.   LABS:  BMET  Recent Labs Lab 03/21/15 2026 03/22/15 0307  NA 136 134*  K 3.5 4.4  CL 101 104  CO2 21* 20*  BUN 22* 23*  CREATININE 1.97* 1.67*  GLUCOSE 161* 103*    Electrolytes  Recent Labs Lab 03/21/15 2026 03/22/15 0307  CALCIUM 8.5* 7.7*  MG  --  1.1*  PHOS  --  2.5    CBC  Recent Labs Lab 03/21/15 2026 03/22/15 0307  WBC 5.3 3.7*  HGB 12.7* 12.0*  HCT 38.1* 35.1*  PLT 151 128*    Coag's No results for input(s): APTT, INR in the last 168 hours.  Sepsis Markers  Recent Labs Lab 03/21/15 2027 03/22/15 0125 03/22/15 0307 03/22/15 0320  LATICACIDVEN 6.7* 2.4* 2.4*  --   PROCALCITON  --    --   --  >175.00    ABG  Recent Labs Lab 03/21/15 2050  PHART 7.344*  PCO2ART 36.7  PO2ART 21.7*    Liver Enzymes  Recent Labs Lab 03/21/15 2026 03/22/15 0307  AST 68*  --   ALT <5*  --   ALKPHOS 49  --   BILITOT 0.8  --   ALBUMIN 3.4* 2.7*    Cardiac Enzymes  Recent Labs Lab 03/21/15 2026 03/22/15 0307  TROPONINI 0.04* 0.04*    Glucose  Recent Labs Lab 03/21/15 2324 03/22/15 0410 03/22/15 0813  GLUCAP 123* 86 74    Imaging Dg Chest Port 1 View  03/21/2015  CLINICAL DATA:  50 year old male with chest congestion, cough, shortness of breath and fever. EXAM: PORTABLE CHEST 1 VIEW COMPARISON:  06/09/2012 FINDINGS: Bilateral lower lung airspace disease is compatible with pneumonia. There is no evidence of pleural effusion or pneumothorax. No acute bony abnormalities are noted. IMPRESSION: Bilateral lower lung airspace disease compatible with pneumonia. Radiographic follow-up to resolution is recommended. Electronically Signed   By: Margarette Canada M.D.   On: 03/21/2015 20:48    ASSESSMENT / PLAN:  PULMONARY A: Acute Hypoxic Respiratory Failure Bilateral Pneumonia  P:   Supplement oxygen to maintain saturation >92% CT chest pending  IV abx per ID sxn     CARDIOVASCULAR A:  Tachycardia suspect from PNA  H/O HTN - Not currently on any medications.  P:  Monitor patient on telemetry Vitals per unit protocol  RENAL A:   Acute Renal Failure Lactic Acidosis - Improving. Hypomagesium   P:   Monitor UOP Trending Renal Function with daily BUN/Creatinine  Replace Mag  Tr lactate   GASTROINTESTINAL A:   No acute issues.  P:   Begin diet   HEMATOLOGIC A:   Mild Anemia  Mild Thrombocytopenia   P:  Tr cbc  Heparin subcutaneous every 8 hours SCDs  INFECTIOUS A:   Sepsis Bilateral Pneumonia> +strep pneuomia urine  1/1 PCT >175   P:   Continuing Levaquin Day #2 Vancomycin #1  Trend PCT  Follow cx data  Checking sputum  culture Urine Legionella pending  Respiratory viral panel PCR & influenza PCR pending  Droplet isolation    ENDOCRINE A:   Hyperglycemia - No h/o DM.   P:   SSI   NEUROLOGIC A:   No acute issues. H/O Heavy EtOH Use - No h/o withdrawal but has never stopped. 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 CRP elevated at 15, ESR only 26 P: total C3 & C4 pending      FAMILY  - Updates: No family at bedside.  - Inter-disciplinary family meet or Palliative Care meeting due by:  03/29/15  DISCUSSION:  50 year old male with known history of discoid lupus. Presenting with infection symptoms and bilateral opacities concerning for possible pneumonia. Highly suspicious for viral pneumonitis given  myalgias and the fact the patient did not receive influenza vaccine. He also has mild acute renal failure and I am initiating gentle IV fluid for hydration. Placing patient on CIWA precautions given history of chronic alcohol use and also checking a serum alcohol level and urine drug screen. Strep Pneuomonia urine positive .   Tammy Parrett NP-C  [facility]  [phone]   03/22/2015, 10:00 AM

## 2015-03-22 NOTE — Progress Notes (Signed)
ANTIBIOTIC CONSULT NOTE - INITIAL  Pharmacy Consult for Vancomycin Indication: pneumonia  Allergies  Allergen Reactions  . Bee Venom Swelling  . Penicillins Hives    Patient Measurements: Height: 6\' 1"  (185.4 cm) Weight: 189 lb (85.73 kg) IBW/kg (Calculated) : 79.9  Vital Signs: Temp: 98.7 F (37.1 C) (12/31 2327) Temp Source: Oral (12/31 2327) BP: 108/69 mmHg (12/31 2320) Pulse Rate: 107 (12/31 2320) Intake/Output from previous day:   Intake/Output from this shift:    Labs:  Recent Labs  03/21/15 2026  WBC 5.3  HGB 12.7*  PLT 151  CREATININE 1.97*   Estimated Creatinine Clearance: 50.7 mL/min (by C-G formula based on Cr of 1.97). No results for input(s): VANCOTROUGH, VANCOPEAK, VANCORANDOM, GENTTROUGH, GENTPEAK, GENTRANDOM, TOBRATROUGH, TOBRAPEAK, TOBRARND, AMIKACINPEAK, AMIKACINTROU, AMIKACIN in the last 72 hours.   Microbiology: Recent Results (from the past 720 hour(s))  Blood Culture (routine x 2)     Status: None (Preliminary result)   Collection Time: 03/21/15  8:25 PM  Result Value Ref Range Status   Specimen Description LEFT ANTECUBITAL  Final   Special Requests BOTTLES DRAWN AEROBIC ONLY 4CC ONLY  Final   Culture PENDING  Incomplete   Report Status PENDING  Incomplete  Blood Culture (routine x 2)     Status: None (Preliminary result)   Collection Time: 03/21/15  8:28 PM  Result Value Ref Range Status   Specimen Description RIGHT ANTECUBITAL  Final   Special Requests BOTTLES DRAWN AEROBIC AND ANAEROBIC 5CC EACH  Final   Culture PENDING  Incomplete   Report Status PENDING  Incomplete    Medical History: Past Medical History  Diagnosis Date  . Hypertension   . Discoid lupus     Medications:  No home medications  Assessment: 50 y.o. M presents with pain, weakness, and chills. Tm 103.1. Pt received Levaquin 750mg  IV in ED ~2100. WBC wnl. LA 6.7. SCr 1.97, est CrCl 50 ml/min. To continue Levaquin and add Vancomycin for PNA.   Goal of Therapy:   Vancomycin trough level 15-20 mcg/ml  Plan:  Vancomycin 750mg  IV q12h Will f/u micro data, renal function, and pt's clinical condition Vanc trough prn  Christoper Fabianaron Emersen Mascari, PharmD, BCPS Clinical pharmacist, pager [phone] 03/22/2015,1:11 AM

## 2015-03-23 ENCOUNTER — Inpatient Hospital Stay (HOSPITAL_COMMUNITY): Payer: Medicaid Other

## 2015-03-23 DIAGNOSIS — R0600 Dyspnea, unspecified: Secondary | ICD-10-CM

## 2015-03-23 LAB — EXPECTORATED SPUTUM ASSESSMENT W REFEX TO RESP CULTURE

## 2015-03-23 LAB — GLUCOSE, CAPILLARY
GLUCOSE-CAPILLARY: 105 mg/dL — AB (ref 65–99)
Glucose-Capillary: 93 mg/dL (ref 65–99)
Glucose-Capillary: 148 mg/dL — ABNORMAL HIGH (ref 65–99)
Glucose-Capillary: 93 mg/dL (ref 65–99)

## 2015-03-23 LAB — CBC
HEMATOCRIT: 33.1 % — AB (ref 39.0–52.0)
HEMOGLOBIN: 11.4 g/dL — AB (ref 13.0–17.0)
MCH: 29.6 pg (ref 26.0–34.0)
MCHC: 34.4 g/dL (ref 30.0–36.0)
MCV: 86 fL (ref 78.0–100.0)
Platelets: 130 10*3/uL — ABNORMAL LOW (ref 150–400)
RBC: 3.85 MIL/uL — ABNORMAL LOW (ref 4.22–5.81)
RDW: 12.1 % (ref 11.5–15.5)
WBC: 14.1 10*3/uL — AB (ref 4.0–10.5)

## 2015-03-23 LAB — LEGIONELLA ANTIGEN, URINE

## 2015-03-23 LAB — EXPECTORATED SPUTUM ASSESSMENT W GRAM STAIN, RFLX TO RESP C

## 2015-03-23 LAB — C4 COMPLEMENT: COMPLEMENT C4, BODY FLUID: 25 mg/dL (ref 14–44)

## 2015-03-23 LAB — BASIC METABOLIC PANEL
ANION GAP: 9 (ref 5–15)
BUN: 19 mg/dL (ref 6–20)
CALCIUM: 7.9 mg/dL — AB (ref 8.9–10.3)
CO2: 22 mmol/L (ref 22–32)
Chloride: 103 mmol/L (ref 101–111)
Creatinine, Ser: 1.09 mg/dL (ref 0.61–1.24)
GFR calc non Af Amer: >60 mL/min (ref >60)
Glucose, Bld: 84 mg/dL (ref 65–99)
Potassium: 3.1 mmol/L — ABNORMAL LOW (ref 3.5–5.1)
Sodium: 134 mmol/L — ABNORMAL LOW (ref 135–145)

## 2015-03-23 LAB — HEPATIC FUNCTION PANEL
ALBUMIN: 2.3 g/dL — AB (ref 3.5–5.0)
ALT: 33 U/L (ref 17–63)
AST: 51 U/L — ABNORMAL HIGH (ref 15–41)
Alkaline Phosphatase: 32 U/L — ABNORMAL LOW (ref 38–126)
BILIRUBIN TOTAL: 1.3 mg/dL — AB (ref 0.3–1.2)
Bilirubin, Direct: 0.3 mg/dL (ref 0.1–0.5)
Indirect Bilirubin: 1 mg/dL — ABNORMAL HIGH (ref 0.3–0.9)
TOTAL PROTEIN: 5.7 g/dL — AB (ref 6.5–8.1)

## 2015-03-23 LAB — PROCALCITONIN: PROCALCITONIN: 143.19 ng/mL

## 2015-03-23 LAB — MAGNESIUM: MAGNESIUM: 1.6 mg/dL — AB (ref 1.7–2.4)

## 2015-03-23 LAB — LACTIC ACID, PLASMA: Lactic Acid, Venous: 1.8 mmol/L (ref 0.5–2.0)

## 2015-03-23 LAB — C3 COMPLEMENT: C3 Complement: 90 mg/dL (ref 82–167)

## 2015-03-23 MED ORDER — MAGNESIUM SULFATE 2 GM/50ML IV SOLN
2.0000 g | Freq: Once | INTRAVENOUS | Status: AC
  Administered 2015-03-23: 2 g via INTRAVENOUS
  Filled 2015-03-23: qty 50

## 2015-03-23 MED ORDER — POTASSIUM CHLORIDE IN NACL 40-0.9 MEQ/L-% IV SOLN
INTRAVENOUS | Status: DC
  Administered 2015-03-23 – 2015-03-24 (×3): 100 mL/h via INTRAVENOUS
  Filled 2015-03-23 (×7): qty 1000

## 2015-03-23 MED ORDER — HYDRALAZINE HCL 20 MG/ML IJ SOLN
5.0000 mg | INTRAMUSCULAR | Status: DC | PRN
  Administered 2015-03-24 – 2015-03-25 (×2): 5 mg via INTRAVENOUS
  Filled 2015-03-23 (×2): qty 1

## 2015-03-23 MED ORDER — POTASSIUM CHLORIDE CRYS ER 20 MEQ PO TBCR
40.0000 meq | EXTENDED_RELEASE_TABLET | Freq: Once | ORAL | Status: AC
  Administered 2015-03-23: 40 meq via ORAL
  Filled 2015-03-23: qty 2

## 2015-03-23 NOTE — Significant Event (Signed)
Patient taken safely to [address], traveled via wheelchair. VS stable prior and during the transfer. Patient gets tachynpeic with exertional activities. All personal belongings taken to new room with patient. Patient did not want RN to let family to be called to let them know that he is being transferred to the floor. Report given to dayshift RN Alona BeneJoyce. Staff in room prior to RN leaving.

## 2015-03-23 NOTE — Progress Notes (Addendum)
[facility] PROGRESS NOTE  Jim Gordon KTG:[id number] DOB: April 01, 1965 DOA: 03/21/2015 PCP: No PCP Per Patient  Length of stay: 2   Assessment/Plan: Active Problems:   Sepsis ([place])   Pneumonia   HISTORY OF PRESENT ILLNESS:49 yr old  presented with fever and body aches starting earlier this week with a cough & sneezing. He endorsed increased dyspnea and fatigue as well. Starting 12/31 he noted fever and chills. He endorsed a cough productive of bright yellow phlegm. At the time he was not having any diarrhea or emesis. On arrival the patient was found to have a rectal temperature of 103.25F and hypoxia on room air to 87%. His lactic acid was elevated at 6.7. The patient denies any prior sore throat or sinus congestion for me at bedside this morning. Now he reports his dyspnea began approximately 2 days ago. She does describe an aching in his chest as well as his joints and diffuse myalgias bilaterally. He reports a minimal cough productive of yellow phlegm. He does endorse subjective fever and chills. Denies any nausea, vomiting, or diarrhea. 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 He denies any recent sick contacts. He did not have an influenza vaccine. The patient has not taken any antibiotics or medications for some time. CT 03/22/15 right middle lobe and bibasilar consolidation with upper lobe ground-glass and airspace opacities, most consistent with pneumonia   Assessment and plan  Acute Hypoxic Respiratory Failure Bilateral Pneumonia  Supplement oxygen to maintain saturation >92%, on 5L  CT chest as above  Cont iv abx, day 3 , repeat CXR in am   Tachycardia suspect from PNA / hypoxia  H/O HTN - Not currently on any medications. Monitor patient on telemetry Prn hydralazine    Acute Renal Failure Lactic Acidosis - Improving. Hypomagesium  improving AKI  1.97>1.09 Replete k and mg     Mild Anemia  Mild Thrombocytopenia  Admits to  drinking heavily Stable  cbc  Heparin subcutaneous every 8 hours SCDs   Sepsis lactate 6.7 on admission, with bacteremia, gram positive cocci Bilateral Pneumonia> +strep pneuomia urine  1/1 PCT >175  Continuing Levaquin Day #3 Vancomycin #2  Trend PCT  Follow cx data  Checking sputum culture Urine Legionella pending  Respiratory viral panel PCR pending  & influenza PCR negative  Droplet isolation     Hyperglycemia - No h/o DM. Cont SSI    H/O Heavy EtOH Use - No h/o withdrawal but has never stopped. 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 CRP elevated at 15, ESR only 26 P: total C3 & C4 pending     DVT prophylaxsis heparin  Code Status:      Code Status Orders        Start     Ordered   03/22/15 0211  Full code   Continuous     03/22/15 0216      Family Communication: Discussed in detail with the patient, all imaging results, lab results explained to the patient   Disposition Plan:  tx to stepdown    Consultants:  PCCM  MICROBIOLOGY: Blood Ctx x2 12/31 ([facility])>>> Urine Ctx 12/31 ([facility])>>> Strep Pneumonia Urine ag >Positive  Legionella urine >>  ANTIBIOTICS: Vancomycin 1/1>>> Levaquin 12/31>>>  SIGNIFICANT EVENTS: 12/31 - Presented to [facility] 01/01 - Transfer to [facility]  LINES/TUBES:  PIV x2   Antibiotics: Anti-infectives    Start     Dose/Rate Route Frequency Ordered Stop   03/22/15 2100  levofloxacin (LEVAQUIN) IVPB 750 mg     750 mg 100 mL/hr over 90 Minutes Intravenous Every 24 hours 03/22/15 0110     03/22/15 0200  vancomycin (VANCOCIN) IVPB 750 mg/150 ml premix     750 mg 150 mL/hr over 60 Minutes Intravenous Every 12 hours 03/22/15 0116     03/21/15 2115  levofloxacin (LEVAQUIN) IVPB 750 mg     750 mg 100 mL/hr over 90 Minutes Intravenous  Once 03/21/15 2103 03/21/15 2204         HPI/Subjective: Unable to  speak for long periods of time due to SOB, no cough, no other sx   Objective: Filed Vitals:   03/23/15 0600 03/23/15 0700 03/23/15 0748 03/23/15 0800  BP: 123/75 144/79  107/74  Pulse: 107 114  95  Temp:   99.6 F (37.6 C)   TempSrc:   Oral   Resp: '31 24  25  ' Height:      Weight:      SpO2: 97% 94%  97%    Intake/Output Summary (Last 24 hours) at 03/23/15 0923 Last data filed at 03/23/15 0500  Gross per 24 hour  Intake   3150 ml  Output   1400 ml  Net   1750 ml    Exam:  General: NAD, following commands  Integument: Warm & dry. Hypopigmented areas noted on face and upper back. Tattoo noted as well. Lymphatics: No appreciated cervical or supraclavicular lymphadenoapthy. HEENT: Moist mucus membranes. No oral ulcers. No scleral injection or icterus.  Cardiovascular: Regular rate. No edema. No appreciable JVD.  Pulmonary: Slightly decreased aeration bilateral lung bases. Normal work of breathing on nasal cannula oxygen. Speaking in complete sentences. Abdomen: Soft. Normal bowel sounds. Nondistended. Grossly nontender. Musculoskeletal: Normal bulk and tone. Hand grip strength 5/5 bilaterally. No joint deformity or effusion appreciated. Neurological: no focal deficits noted . Moving all 4 extremities equally.  Psychiatric: Mood and affect congruent. Speech normal rhythm, rate & tone.    Data Review   Micro Results Recent Results (from the past 240 hour(s))  Blood Culture (routine x 2)     Status: None (Preliminary result)   Collection Time: 03/21/15  8:25 PM  Result Value Ref Range Status   Specimen Description LEFT ANTECUBITAL  Final   Special Requests BOTTLES DRAWN AEROBIC ONLY 4CC ONLY  Final   Culture PENDING  Incomplete   Report Status PENDING  Incomplete  Urine culture     Status: None (Preliminary result)   Collection Time: 03/21/15  8:25 PM  Result Value Ref Range Status   Specimen Description URINE, CLEAN CATCH  Final   Special Requests NONE   Final   Culture   Final    NO GROWTH < 12 HOURS Performed at [facility]    Report Status PENDING  Incomplete  Blood Culture (routine x 2)     Status: None (Preliminary result)   Collection Time: 03/21/15  8:28 PM  Result Value Ref Range Status   Specimen Description RIGHT ANTECUBITAL  Final   Special Requests BOTTLES DRAWN AEROBIC AND ANAEROBIC 5CC EACH  Final   Culture PENDING  Incomplete   Report Status PENDING  Incomplete  MRSA PCR Screening     Status: None   Collection Time: 03/21/15 11:24 PM  Result Value Ref Range Status   MRSA by PCR NEGATIVE NEGATIVE Final  Comment:        The GeneXpert MRSA Assay (FDA approved for NASAL specimens only), is one component of a comprehensive MRSA colonization surveillance program. It is not intended to diagnose MRSA infection nor to guide or monitor treatment for MRSA infections.     Radiology Reports Ct Chest Wo Contrast  03/22/2015  CLINICAL DATA:  Chest pain and shortness of breath. History of lupus. EXAM: CT CHEST WITHOUT CONTRAST TECHNIQUE: Multidetector CT imaging of the chest was performed following the standard protocol without IV contrast. COMPARISON:  Plain films of 1 day prior and 06/09/2012. No prior CT. FINDINGS: Mediastinum/Nodes: Normal heart size, without pericardial effusion. Advanced multivessel coronary artery atherosclerosis, including within the LAD and branches on image 38/series 2 and right coronary artery on image 40/series 2. No mediastinal or definite hilar adenopathy, given limitations of unenhanced CT. Lungs/Pleura: Trace right pleural fluid. Right middle lobe near complete consolidation. Patchy bibasilar airspace disease. Peribronchovascular left greater than right upper lobe predominant airspace and ground-glass opacities. No extra alveolar air. Upper abdomen: Mild hepatic steatosis. Normal imaged portions of the spleen, stomach, adrenal glands, kidneys. Musculoskeletal: No acute osseous abnormality.  Advanced degenerative disc disease at C6-7. IMPRESSION: 1. right middle lobe and bibasilar consolidation with upper lobe ground-glass and airspace opacities, most consistent with pneumonia. Given the appearance of the upper lobes, atypical etiologies should be considered. 2. Age advanced coronary artery atherosclerosis. Recommend assessment of coronary risk factors and consideration of medical therapy. 3. Trace right pleural fluid. 4. Hepatic steatosis. Electronically Signed   By: Abigail Miyamoto M.D.   On: 03/22/2015 11:04   Dg Chest Port 1 View  03/23/2015  CLINICAL DATA:  50 year old hypertensive male with pneumonia. Lupus. Subsequent encounter. EXAM: PORTABLE CHEST 1 VIEW COMPARISON:  03/22/2015 and chest CT.  03/21/2015 chest x-ray. FINDINGS: Slight worsening of left lower lobe consolidation. Persistent consolidation right middle lobe and right lower lobe. CT detected patchy nodular airspace disease throughout remainder of lungs better appreciated on recent chest CT. Findings may be related to atypical infection but will need to be followed interval complete clearance to exclude underlying mass or changes of inflammatory disease. No pneumothorax. Cardiomegaly. IMPRESSION: Slight worsening of left lower lobe consolidation. Persistent consolidation right middle lobe and right lower lobe. CT detected patchy nodular airspace disease throughout remainder of lungs better appreciated on recent chest CT. Electronically Signed   By: Genia Del M.D.   On: 03/23/2015 07:45   Dg Chest Port 1 View  03/21/2015  CLINICAL DATA:  50 year old male with chest congestion, cough, shortness of breath and fever. EXAM: PORTABLE CHEST 1 VIEW COMPARISON:  06/09/2012 FINDINGS: Bilateral lower lung airspace disease is compatible with pneumonia. There is no evidence of pleural effusion or pneumothorax. No acute bony abnormalities are noted. IMPRESSION: Bilateral lower lung airspace disease compatible with pneumonia. Radiographic  follow-up to resolution is recommended. Electronically Signed   By: Margarette Canada M.D.   On: 03/21/2015 20:48     CBC  Recent Labs Lab 03/21/15 2026 03/22/15 0307 03/23/15 0232  WBC 5.3 3.7* 14.1*  HGB 12.7* 12.0* 11.4*  HCT 38.1* 35.1* 33.1*  PLT 151 128* 130*  MCV 91.4 89.5 86.0  MCH 30.5 30.6 29.6  MCHC 33.3 34.2 34.4  RDW 12.5 12.6 12.1  LYMPHSABS 0.7 0.6*  --   MONOABS 0.0* 0.1  --   EOSABS 0.0 0.0  --   BASOSABS 0.0 0.0  --     Chemistries   Recent Labs Lab 03/21/15 2026 03/22/15  7062 03/23/15 0232  NA 136 134* 134*  K 3.5 4.4 3.1*  CL 101 104 103  CO2 21* 20* 22  GLUCOSE 161* 103* 84  BUN 22* 23* 19  CREATININE 1.97* 1.67* 1.09  CALCIUM 8.5* 7.7* 7.9*  MG  --  1.1* 1.6*  AST 68*  --  51*  ALT <5*  --  33  ALKPHOS 49  --  32*  BILITOT 0.8  --  1.3*   ------------------------------------------------------------------------------------------------------------------ estimated creatinine clearance is 91.6 mL/min (by C-G formula based on Cr of 1.09). ------------------------------------------------------------------------------------------------------------------ No results for input(s): HGBA1C in the last 72 hours. ------------------------------------------------------------------------------------------------------------------ No results for input(s): CHOL, HDL, LDLCALC, TRIG, CHOLHDL, LDLDIRECT in the last 72 hours. ------------------------------------------------------------------------------------------------------------------ No results for input(s): TSH, T4TOTAL, T3FREE, THYROIDAB in the last 72 hours.  Invalid input(s): FREET3 ------------------------------------------------------------------------------------------------------------------ No results for input(s): VITAMINB12, FOLATE, FERRITIN, TIBC, IRON, RETICCTPCT in the last 72 hours.  Coagulation profile No results for input(s): INR, PROTIME in the last 168 hours.  No results for input(s):  DDIMER in the last 72 hours.  Cardiac Enzymes  Recent Labs Lab 03/22/15 0307 03/22/15 1037 03/22/15 1645  TROPONINI 0.04* 0.04* 0.12*   ------------------------------------------------------------------------------------------------------------------ Invalid input(s): POCBNP   CBG:  Recent Labs Lab 03/22/15 1138 03/22/15 1608 03/22/15 2016 03/22/15 2248 03/23/15 0747  GLUCAP 95 113* 119* 85 93       Studies: Ct Chest Wo Contrast  03/22/2015  CLINICAL DATA:  Chest pain and shortness of breath. History of lupus. EXAM: CT CHEST WITHOUT CONTRAST TECHNIQUE: Multidetector CT imaging of the chest was performed following the standard protocol without IV contrast. COMPARISON:  Plain films of 1 day prior and 06/09/2012. No prior CT. FINDINGS: Mediastinum/Nodes: Normal heart size, without pericardial effusion. Advanced multivessel coronary artery atherosclerosis, including within the LAD and branches on image 38/series 2 and right coronary artery on image 40/series 2. No mediastinal or definite hilar adenopathy, given limitations of unenhanced CT. Lungs/Pleura: Trace right pleural fluid. Right middle lobe near complete consolidation. Patchy bibasilar airspace disease. Peribronchovascular left greater than right upper lobe predominant airspace and ground-glass opacities. No extra alveolar air. Upper abdomen: Mild hepatic steatosis. Normal imaged portions of the spleen, stomach, adrenal glands, kidneys. Musculoskeletal: No acute osseous abnormality. Advanced degenerative disc disease at C6-7. IMPRESSION: 1. right middle lobe and bibasilar consolidation with upper lobe ground-glass and airspace opacities, most consistent with pneumonia. Given the appearance of the upper lobes, atypical etiologies should be considered. 2. Age advanced coronary artery atherosclerosis. Recommend assessment of coronary risk factors and consideration of medical therapy. 3. Trace right pleural fluid. 4. Hepatic steatosis.  Electronically Signed   By: Abigail Miyamoto M.D.   On: 03/22/2015 11:04   Dg Chest Port 1 View  03/23/2015  CLINICAL DATA:  50 year old hypertensive male with pneumonia. Lupus. Subsequent encounter. EXAM: PORTABLE CHEST 1 VIEW COMPARISON:  03/22/2015 and chest CT.  03/21/2015 chest x-ray. FINDINGS: Slight worsening of left lower lobe consolidation. Persistent consolidation right middle lobe and right lower lobe. CT detected patchy nodular airspace disease throughout remainder of lungs better appreciated on recent chest CT. Findings may be related to atypical infection but will need to be followed interval complete clearance to exclude underlying mass or changes of inflammatory disease. No pneumothorax. Cardiomegaly. IMPRESSION: Slight worsening of left lower lobe consolidation. Persistent consolidation right middle lobe and right lower lobe. CT detected patchy nodular airspace disease throughout remainder of lungs better appreciated on recent chest CT. Electronically Signed   By: Alcide Evener.D.  On: 03/23/2015 07:45   Dg Chest Port 1 View  03/21/2015  CLINICAL DATA:  50 year old male with chest congestion, cough, shortness of breath and fever. EXAM: PORTABLE CHEST 1 VIEW COMPARISON:  06/09/2012 FINDINGS: Bilateral lower lung airspace disease is compatible with pneumonia. There is no evidence of pleural effusion or pneumothorax. No acute bony abnormalities are noted. IMPRESSION: Bilateral lower lung airspace disease compatible with pneumonia. Radiographic follow-up to resolution is recommended. Electronically Signed   By: Margarette Canada M.D.   On: 03/21/2015 20:48      No results found for: HGBA1C Lab Results  Component Value Date   [facility]  01/24/2010    59        Total Cholesterol/HDL:CHD Risk Coronary Heart Disease Risk Table                     Men   Women  1/2 Average Risk   3.4   3.3  Average Risk       5.0   4.4  2 X Average Risk   9.6   7.1  3 X Average Risk  23.4   11.0        Use the  calculated Patient Ratio above and the CHD Risk Table to determine the patient's CHD Risk.        ATP III CLASSIFICATION (LDL):  <100     mg/dL   Optimal  100-129  mg/dL   Near or Above                    Optimal  130-159  mg/dL   Borderline  160-189  mg/dL   High  >190     mg/dL   Very High   CREATININE 1.09 03/23/2015       Scheduled Meds: . antiseptic oral rinse  7 mL Mouth Rinse BID  . folic acid  1 mg Oral Daily  . heparin  5,000 Units Subcutaneous 3 times per day  . insulin aspart  0-15 Units Subcutaneous TID WC  . insulin aspart  0-5 Units Subcutaneous QHS  . levofloxacin (LEVAQUIN) IV  750 mg Intravenous Q24H  . magnesium sulfate 1 - 4 g bolus IVPB  2 g Intravenous Once  . multivitamin with minerals  1 tablet Oral Daily  . thiamine  100 mg Oral Daily  . vancomycin  750 mg Intravenous Q12H   Continuous Infusions: . 0.9 % NaCl with KCl 40 mEq / L 100 mL/hr (03/23/15 0811)  . lactated ringers 75 mL/hr at 03/23/15 3220    Active Problems:   Sepsis ([place])   Pneumonia    Time spent: 12 minutes   [facility] Pager [phone]. If 7PM-7AM, please contact night-coverage at [email or web address], password [facility] 03/23/2015, 9:23 AM  LOS: 2 days

## 2015-03-23 NOTE — Progress Notes (Signed)
Utilization review completed.  

## 2015-03-23 NOTE — Progress Notes (Signed)
Echocardiogram 2D Echocardiogram has been performed.  Nolon RodBrown, Tony 03/23/2015, 1:33 PM

## 2015-03-24 ENCOUNTER — Inpatient Hospital Stay (HOSPITAL_COMMUNITY): Payer: Medicaid Other

## 2015-03-24 LAB — COMPREHENSIVE METABOLIC PANEL
ALT: 35 U/L (ref 17–63)
AST: 53 U/L — ABNORMAL HIGH (ref 15–41)
Albumin: 2.2 g/dL — ABNORMAL LOW (ref 3.5–5.0)
Alkaline Phosphatase: 70 U/L (ref 38–126)
Anion gap: 6 (ref 5–15)
BUN: 10 mg/dL (ref 6–20)
CHLORIDE: 103 mmol/L (ref 101–111)
CO2: 24 mmol/L (ref 22–32)
Calcium: 8.3 mg/dL — ABNORMAL LOW (ref 8.9–10.3)
Creatinine, Ser: 0.77 mg/dL (ref 0.61–1.24)
Glucose, Bld: 91 mg/dL (ref 65–99)
POTASSIUM: 4 mmol/L (ref 3.5–5.1)
Sodium: 133 mmol/L — ABNORMAL LOW (ref 135–145)
Total Bilirubin: 1.5 mg/dL — ABNORMAL HIGH (ref 0.3–1.2)
Total Protein: 6.2 g/dL — ABNORMAL LOW (ref 6.5–8.1)

## 2015-03-24 LAB — CBC
HCT: 30.3 % — ABNORMAL LOW (ref 39.0–52.0)
Hemoglobin: 10.4 g/dL — ABNORMAL LOW (ref 13.0–17.0)
MCH: 29.5 pg (ref 26.0–34.0)
MCHC: 34.3 g/dL (ref 30.0–36.0)
MCV: 86.1 fL (ref 78.0–100.0)
PLATELETS: 148 10*3/uL — AB (ref 150–400)
RBC: 3.52 MIL/uL — ABNORMAL LOW (ref 4.22–5.81)
RDW: 12.3 % (ref 11.5–15.5)
WBC: 19 10*3/uL — ABNORMAL HIGH (ref 4.0–10.5)

## 2015-03-24 LAB — RESPIRATORY VIRUS PANEL
ADENOVIRUS: POSITIVE — AB
INFLUENZA A: NEGATIVE
INFLUENZA B 1: NEGATIVE
Metapneumovirus: NEGATIVE
PARAINFLUENZA 3 A: NEGATIVE
Parainfluenza 1: NEGATIVE
Parainfluenza 2: NEGATIVE
Respiratory Syncytial Virus A: NEGATIVE
Respiratory Syncytial Virus B: NEGATIVE
Rhinovirus: NEGATIVE

## 2015-03-24 LAB — CULTURE, BLOOD (ROUTINE X 2)

## 2015-03-24 LAB — GLUCOSE, CAPILLARY
GLUCOSE-CAPILLARY: 205 mg/dL — AB (ref 65–99)
GLUCOSE-CAPILLARY: 176 mg/dL — AB (ref 65–99)
Glucose-Capillary: 87 mg/dL (ref 65–99)
Glucose-Capillary: 137 mg/dL — ABNORMAL HIGH (ref 65–99)

## 2015-03-24 LAB — URINE CULTURE: CULTURE: NO GROWTH

## 2015-03-24 LAB — HEMOGLOBIN A1C
Hgb A1c MFr Bld: 5.3 % (ref 4.8–5.6)
Mean Plasma Glucose: 105 mg/dL

## 2015-03-24 LAB — PROCALCITONIN: Procalcitonin: 49.73 ng/mL

## 2015-03-24 MED ORDER — FUROSEMIDE 10 MG/ML IJ SOLN
40.0000 mg | Freq: Once | INTRAMUSCULAR | Status: AC
  Administered 2015-03-24: 40 mg via INTRAVENOUS
  Filled 2015-03-24: qty 4

## 2015-03-24 NOTE — Progress Notes (Signed)
Patient's Respiratory Panel was + for Adenovirus.  Per Infection Prevention, patient should be on droplet and contact precautions.  Appropriate orders placed.

## 2015-03-24 NOTE — Clinical Documentation Improvement (Signed)
Hospitalist  (Query responses must be documented in the progress notes and discharge summary,  Not on the CDI BPA itself.  Responses documented on the CDI BPA are not codeable because BPA's are not part of the permanent medical record.)  Please document if the patient's bilateral pneumonia be clarified as a more specific type.  Clinical Information: Strep Pneumo Urinary Antigen reported as "Positive".   Please exercise your independent, professional judgment when responding. A specific answer is not anticipated or expected.   Thank You, Jerral Ralphathy R Shealyn Sean  RN BSN CCDS [phone] [employer] [facility] [place]

## 2015-03-24 NOTE — Progress Notes (Addendum)
[facility] PROGRESS NOTE  Jim Gordon BDZ:[id number] DOB: [birth year] DOA: 03/21/2015 PCP: No PCP Per Patient  Length of stay: 3   Assessment/Plan: Active Problems:   Sepsis ([place])   Pneumonia   HISTORY OF PRESENT ILLNESS:50 yr old  presented with fever and body aches starting earlier this week with a cough & sneezing. He endorsed increased dyspnea and fatigue as well. Starting 12/31 he noted fever and chills. He endorsed a cough productive of bright yellow phlegm. At the time he was not having any diarrhea or emesis. On arrival the patient was found to have a rectal temperature of 103.61F and hypoxia on room air to 87%. His lactic acid was elevated at 6.7. The patient denies any prior sore throat or sinus congestion for me at bedside this morning. Now he reports his dyspnea began approximately 2 days ago. She does describe an aching in his chest as well as his joints and diffuse myalgias bilaterally. He reports a minimal cough productive of yellow phlegm. He does endorse subjective fever and chills. Denies any nausea, vomiting, or diarrhea. 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 He denies any recent sick contacts. He did not have an influenza vaccine. The patient has not taken any antibiotics or medications for some time. CT 03/22/15 right middle lobe and bibasilar consolidation with upper lobe ground-glass and airspace opacities, most consistent with pneumonia   Assessment and plan  Acute Hypoxic Respiratory Failure Bilateral Pneumonia due to strep pneumo, blood culture positive for gram-positive cocci which resulted 03/23/15, previous blood culture showed staph coag negative Respiratory Panel+ for Adenovirus Supplement oxygen to maintain saturation >92%, on 5L  CT chest as above  Cont iv abx, day 4 , repeat CXR  Showed B/L PNA and CHF  Febrile last night with worsening WBC  DC IVF , administer Lasix to hopefully improve oxygen requirements Repeat blood  culture given worsening white count   Tachycardia suspect from PNA / hypoxia  H/O HTN - Not currently on any medications. Monitor patient on telemetry Prn hydralazine    Acute Renal Failure Lactic Acidosis - Improving. Hypomagesium  improving AKI  1.97>1.09 Replete k and mg  Lasix #1   Mild Anemia  Mild Thrombocytopenia  Admits to drinking heavily Stable  cbc  Heparin subcutaneous every 8 hours SCDs   Sepsis lactate 6.7 on admission, with bacteremia, gram positive cocci Bilateral Pneumonia> +strep pneuomia urine  1/1 PCT >175  Continuing Levaquin Day #4 Vancomycin #3  Trend PCT  Follow cx data  Sputum culture no growth so far Urine Legionella pending , streptococcal antigen positive Respiratory viral panel PCR pending  & influenza PCR negative  Droplet isolation     Hyperglycemia - No h/o DM. Cont SSI    H/O Heavy EtOH Use - No h/o withdrawal but has never stopped. 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 CRP elevated at 15, ESR only 26 P: total C3 & C4 pending     DVT prophylaxsis heparin  Code Status:      Code Status Orders        Start     Ordered   03/22/15 0211  Full code   Continuous     03/22/15 0216      Family Communication: Discussed in detail with the patient, all imaging results, lab results explained  to the patient   Disposition Plan: Continue telemetry, not stable for discharge    Consultants:  PCCM  MICROBIOLOGY: Blood Ctx x2 12/31 ([facility])>>> Urine Ctx 12/31 ([facility])>>> Strep Pneumonia Urine ag >Positive  Legionella urine >>  ANTIBIOTICS: Vancomycin 1/1>>> Levaquin 12/31>>>  SIGNIFICANT EVENTS: 12/31 - Presented to [facility] 01/01 - [facility] to [facility]  LINES/TUBES: PIV x2   Antibiotics: Anti-infectives    Start     Dose/Rate Route Frequency Ordered Stop   03/22/15 2100  levofloxacin (LEVAQUIN)  IVPB 750 mg     750 mg 100 mL/hr over 90 Minutes Intravenous Every 24 hours 03/22/15 0110     03/22/15 0200  vancomycin (VANCOCIN) IVPB 750 mg/150 ml premix     750 mg 150 mL/hr over 60 Minutes Intravenous Every 12 hours 03/22/15 0116     03/21/15 2115  levofloxacin (LEVAQUIN) IVPB 750 mg     750 mg 100 mL/hr over 90 Minutes Intravenous  Once 03/21/15 2103 03/21/15 2204         HPI/Subjective: Unable to speak for long periods of time due to SOB, no cough, no other sx   Objective: Filed Vitals:   03/23/15 1951 03/24/15 0130 03/24/15 0539 03/24/15 1312  BP: 107/71 136/82 150/93 169/90  Pulse: 108 97 97 98  Temp: 101.2 F (38.4 C) 99.9 F (37.7 C) 99.1 F (37.3 C) 98.7 F (37.1 C)  TempSrc: Oral Oral Oral Oral  Resp: _0 Height: _1  (1.88 m)     Weight: 83.961 kg (185 lb 1.6 oz)     SpO2: 91% 95% 98% 99%    Intake/Output Summary (Last 24 hours) at 03/24/15 1359 Last data filed at 03/24/15 1326  Gross per 24 hour  Intake   3725 ml  Output   3125 ml  Net    600 ml    Exam:  General: NAD, following commands  Integument: Warm & dry. Hypopigmented areas noted on face and upper back. Tattoo noted as well. Lymphatics: No appreciated cervical or supraclavicular lymphadenoapthy. HEENT: Moist mucus membranes. No oral ulcers. No scleral injection or icterus.  Cardiovascular: Regular rate. No edema. No appreciable JVD.  Pulmonary: Slightly decreased aeration bilateral lung bases. Normal work of breathing on nasal cannula oxygen. Speaking in complete sentences. Abdomen: Soft. Normal bowel sounds. Nondistended. Grossly nontender. Musculoskeletal: Normal bulk and tone. Hand grip strength 5/5 bilaterally. No joint deformity or effusion appreciated. Neurological: no focal deficits noted . Moving all 4 extremities equally.  Psychiatric: Mood and affect congruent. Speech normal rhythm, rate & tone.    Data Review   Micro Results Recent Results (from the  past 240 hour(s))  Blood Culture (routine x 2)     Status: None (Preliminary result)   Collection Time: 03/21/15  8:25 PM  Result Value Ref Range Status   Specimen Description BLOOD LEFT ANTECUBITAL DRAWN BY RN  Final   Special Requests BOTTLES DRAWN AEROBIC ONLY 4CC ONLY  Final   Culture NO GROWTH 3 DAYS  Final   Report Status PENDING  Incomplete  Urine culture     Status: None   Collection Time: 03/21/15  8:25 PM  Result Value Ref Range Status   Specimen Description URINE, CLEAN CATCH  Final   Special Requests NONE  Final   Culture   Final    NO GROWTH 2 DAYS Performed at [facility]    Report Status 03/24/2015 FINAL  Final  Blood Culture (routine x 2)  Status: None   Collection Time: 03/21/15  8:28 PM  Result Value Ref Range Status   Specimen Description BLOOD RIGHT ANTECUBITAL  Final   Special Requests BOTTLES DRAWN AEROBIC AND ANAEROBIC 5CC EACH  Final   Culture  Setup Time   Final    GRAM POSITIVE COCCI AEROBIC BOTTLE Gram Stain Report Called to,Read Back By and Verified With: HABIB,I ON 03/23/15 _0  BY MITCHELL,S Performed at [facility]    Culture   Final    STAPHYLOCOCCUS SPECIES (COAGULASE NEGATIVE) THE SIGNIFICANCE OF ISOLATING THIS ORGANISM FROM A SINGLE SET OF BLOOD CULTURES WHEN MULTIPLE SETS ARE DRAWN IS UNCERTAIN. PLEASE NOTIFY THE MICROBIOLOGY DEPARTMENT WITHIN ONE WEEK IF SPECIATION AND SENSITIVITIES ARE REQUIRED. Performed at [facility]    Report Status 03/24/2015 FINAL  Final  MRSA PCR Screening     Status: None   Collection Time: 03/21/15 11:24 PM  Result Value Ref Range Status   MRSA by PCR NEGATIVE NEGATIVE Final    Comment:        The GeneXpert MRSA Assay (FDA approved for NASAL specimens only), is one component of a comprehensive MRSA colonization surveillance program. It is not intended to diagnose MRSA infection nor to guide or monitor treatment for MRSA infections.   Respiratory virus panel     Status: Abnormal    Collection Time: 03/22/15  3:35 AM  Result Value Ref Range Status   Source - RVPAN NASAL SWAB  Corrected   Respiratory Syncytial Virus A Negative Negative Final   Respiratory Syncytial Virus B Negative Negative Final   Influenza A Negative Negative Final   Influenza B Negative Negative Final   Parainfluenza 1 Negative Negative Final   Parainfluenza 2 Negative Negative Final   Parainfluenza 3 Negative Negative Final   Metapneumovirus Negative Negative Final   Rhinovirus Negative Negative Final   Adenovirus Positive (A) Negative Final    Comment: (NOTE) Positive for Adenovirus Species B/E Performed At: [facility] [address] [place], [place] [id number] Lindon Romp MD ML:[id number]   Culture, expectorated sputum-assessment     Status: None   Collection Time: 03/23/15  7:15 PM  Result Value Ref Range Status   Specimen Description SPUTUM  Final   Special Requests NONE  Final   Sputum evaluation   Final    THIS SPECIMEN IS ACCEPTABLE. RESPIRATORY CULTURE REPORT TO FOLLOW.   Report Status 03/23/2015 FINAL  Final    Radiology Reports Ct Chest Wo Contrast  03/22/2015  CLINICAL DATA:  Chest pain and shortness of breath. History of lupus. EXAM: CT CHEST WITHOUT CONTRAST TECHNIQUE: Multidetector CT imaging of the chest was performed following the standard protocol without IV contrast. COMPARISON:  Plain films of 1 day prior and 06/09/2012. No prior CT. FINDINGS: Mediastinum/Nodes: Normal heart size, without pericardial effusion. Advanced multivessel coronary artery atherosclerosis, including within the LAD and branches on image 38/series 2 and right coronary artery on image 40/series 2. No mediastinal or definite hilar adenopathy, given limitations of unenhanced CT. Lungs/Pleura: Trace right pleural fluid. Right middle lobe near complete consolidation. Patchy bibasilar airspace disease. Peribronchovascular left greater than right upper lobe predominant airspace and ground-glass  opacities. No extra alveolar air. Upper abdomen: Mild hepatic steatosis. Normal imaged portions of the spleen, stomach, adrenal glands, kidneys. Musculoskeletal: No acute osseous abnormality. Advanced degenerative disc disease at C6-7. IMPRESSION: 1. right middle lobe and bibasilar consolidation with upper lobe ground-glass and airspace opacities, most consistent with pneumonia. Given the appearance of the upper lobes,  atypical etiologies should be considered. 2. Age advanced coronary artery atherosclerosis. Recommend assessment of coronary risk factors and consideration of medical therapy. 3. Trace right pleural fluid. 4. Hepatic steatosis. Electronically Signed   By: Abigail Miyamoto M.D.   On: 03/22/2015 11:04   Dg Chest Port 1 View  03/24/2015  CLINICAL DATA:  Shortness of breath and cough EXAM: PORTABLE CHEST 1 VIEW COMPARISON:  March 23, 2015 chest radiograph and chest CT March 22, 2015 FINDINGS: There is persistent patchy airspace opacity throughout both mid and lower lung zones, essentially stable compared to 1 day prior. There is a small right pleural effusion, stable. There is stable cardiomegaly. The pulmonary vascularity is within normal limits. No adenopathy apparent. IMPRESSION: Cardiomegaly with small right effusion. Airspace opacity bilaterally may represent a somewhat atypical appearance of edema. All of these changes may be secondary to congestive heart failure, although superimposed pneumonia in the lower lung zones is questioned. The appearance on the recent CT is concerning for potential infectious etiology in the bases, possibly superimposed on pulmonary edema. Electronically Signed   By: Lowella Grip III M.D.   On: 03/24/2015 07:45   Dg Chest Port 1 View  03/23/2015  CLINICAL DATA:  50 year old hypertensive male with pneumonia. Lupus. Subsequent encounter. EXAM: PORTABLE CHEST 1 VIEW COMPARISON:  03/22/2015 and chest CT.  03/21/2015 chest x-ray. FINDINGS: Slight worsening of left lower  lobe consolidation. Persistent consolidation right middle lobe and right lower lobe. CT detected patchy nodular airspace disease throughout remainder of lungs better appreciated on recent chest CT. Findings may be related to atypical infection but will need to be followed interval complete clearance to exclude underlying mass or changes of inflammatory disease. No pneumothorax. Cardiomegaly. IMPRESSION: Slight worsening of left lower lobe consolidation. Persistent consolidation right middle lobe and right lower lobe. CT detected patchy nodular airspace disease throughout remainder of lungs better appreciated on recent chest CT. Electronically Signed   By: Genia Del M.D.   On: 03/23/2015 07:45   Dg Chest Port 1 View  03/21/2015  CLINICAL DATA:  50 year old male with chest congestion, cough, shortness of breath and fever. EXAM: PORTABLE CHEST 1 VIEW COMPARISON:  06/09/2012 FINDINGS: Bilateral lower lung airspace disease is compatible with pneumonia. There is no evidence of pleural effusion or pneumothorax. No acute bony abnormalities are noted. IMPRESSION: Bilateral lower lung airspace disease compatible with pneumonia. Radiographic follow-up to resolution is recommended. Electronically Signed   By: Margarette Canada M.D.   On: 03/21/2015 20:48     CBC  Recent Labs Lab 03/21/15 2026 03/22/15 0307 03/23/15 0232 03/24/15 0533  WBC 5.3 3.7* 14.1* 19.0*  HGB 12.7* 12.0* 11.4* 10.4*  HCT 38.1* 35.1* 33.1* 30.3*  PLT 151 128* 130* 148*  MCV 91.4 89.5 86.0 86.1  MCH 30.5 30.6 29.6 29.5  MCHC 33.3 34.2 34.4 34.3  RDW 12.5 12.6 12.1 12.3  LYMPHSABS 0.7 0.6*  --   --   MONOABS 0.0* 0.1  --   --   EOSABS 0.0 0.0  --   --   BASOSABS 0.0 0.0  --   --     Chemistries   Recent Labs Lab 03/21/15 2026 03/22/15 0307 03/23/15 0232 03/24/15 0533  NA 136 134* 134* 133*  K 3.5 4.4 3.1* 4.0  CL 101 104 103 103  CO2 21* 20* 22 24  GLUCOSE 161* 103* 84 91  BUN 22* 23* 19 10  CREATININE 1.97* 1.67* 1.09  0.77  CALCIUM 8.5* 7.7* 7.9* 8.3*  MG  --  1.1* 1.6*  --   AST 68*  --  51* 53*  ALT <5*  --  33 35  ALKPHOS 49  --  32* 70  BILITOT 0.8  --  1.3* 1.5*   ------------------------------------------------------------------------------------------------------------------ estimated creatinine clearance is 128.4 mL/min (by C-G formula based on Cr of 0.77). ------------------------------------------------------------------------------------------------------------------  Recent Labs  03/22/15 0307  HGBA1C 5.3   ------------------------------------------------------------------------------------------------------------------ No results for input(s): CHOL, HDL, LDLCALC, TRIG, CHOLHDL, LDLDIRECT in the last 72 hours. ------------------------------------------------------------------------------------------------------------------ No results for input(s): TSH, T4TOTAL, T3FREE, THYROIDAB in the last 72 hours.  Invalid input(s): FREET3 ------------------------------------------------------------------------------------------------------------------ No results for input(s): VITAMINB12, FOLATE, FERRITIN, TIBC, IRON, RETICCTPCT in the last 72 hours.  Coagulation profile No results for input(s): INR, PROTIME in the last 168 hours.  No results for input(s): DDIMER in the last 72 hours.  Cardiac Enzymes  Recent Labs Lab 03/22/15 0307 03/22/15 1037 03/22/15 1645  TROPONINI 0.04* 0.04* 0.12*   ------------------------------------------------------------------------------------------------------------------ Invalid input(s): POCBNP   CBG:  Recent Labs Lab 03/23/15 1217 03/23/15 1716 03/23/15 2104 03/24/15 0720 03/24/15 1140  GLUCAP 148* 93 105* 87 205*       Studies: Dg Chest Port 1 View  03/24/2015  CLINICAL DATA:  Shortness of breath and cough EXAM: PORTABLE CHEST 1 VIEW COMPARISON:  March 23, 2015 chest radiograph and chest CT March 22, 2015 FINDINGS: There is persistent  patchy airspace opacity throughout both mid and lower lung zones, essentially stable compared to 1 day prior. There is a small right pleural effusion, stable. There is stable cardiomegaly. The pulmonary vascularity is within normal limits. No adenopathy apparent. IMPRESSION: Cardiomegaly with small right effusion. Airspace opacity bilaterally may represent a somewhat atypical appearance of edema. All of these changes may be secondary to congestive heart failure, although superimposed pneumonia in the lower lung zones is questioned. The appearance on the recent CT is concerning for potential infectious etiology in the bases, possibly superimposed on pulmonary edema. Electronically Signed   By: Lowella Grip III M.D.   On: 03/24/2015 07:45   Dg Chest Port 1 View  03/23/2015  CLINICAL DATA:  50 year old hypertensive male with pneumonia. Lupus. Subsequent encounter. EXAM: PORTABLE CHEST 1 VIEW COMPARISON:  03/22/2015 and chest CT.  03/21/2015 chest x-ray. FINDINGS: Slight worsening of left lower lobe consolidation. Persistent consolidation right middle lobe and right lower lobe. CT detected patchy nodular airspace disease throughout remainder of lungs better appreciated on recent chest CT. Findings may be related to atypical infection but will need to be followed interval complete clearance to exclude underlying mass or changes of inflammatory disease. No pneumothorax. Cardiomegaly. IMPRESSION: Slight worsening of left lower lobe consolidation. Persistent consolidation right middle lobe and right lower lobe. CT detected patchy nodular airspace disease throughout remainder of lungs better appreciated on recent chest CT. Electronically Signed   By: Genia Del M.D.   On: 03/23/2015 07:45      Lab Results  Component Value Date   HGBA1C 5.3 03/22/2015   Lab Results  Component Value Date   [facility]  01/24/2010    59        Total Cholesterol/HDL:CHD Risk Coronary Heart Disease Risk Table                      Men   Women  1/2 Average Risk   3.4   3.3  Average Risk       5.0   4.4  2 X Average Risk   9.6   7.1  3  X Average Risk  23.4   11.0        Use the calculated Patient Ratio above and the CHD Risk Table to determine the patient's CHD Risk.        ATP III CLASSIFICATION (LDL):  <100     mg/dL   Optimal  100-129  mg/dL   Near or Above                    Optimal  130-159  mg/dL   Borderline  160-189  mg/dL   High  >190     mg/dL   Very High   CREATININE 0.77 03/24/2015       Scheduled Meds: . antiseptic oral rinse  7 mL Mouth Rinse BID  . folic acid  1 mg Oral Daily  . heparin  5,000 Units Subcutaneous 3 times per day  . insulin aspart  0-15 Units Subcutaneous TID WC  . insulin aspart  0-5 Units Subcutaneous QHS  . levofloxacin (LEVAQUIN) IV  750 mg Intravenous Q24H  . multivitamin with minerals  1 tablet Oral Daily  . thiamine  100 mg Oral Daily  . vancomycin  750 mg Intravenous Q12H   Continuous Infusions: . 0.9 % NaCl with KCl 40 mEq / L 100 mL/hr (03/24/15 1002)  . lactated ringers 75 mL/hr at 03/24/15 1002    Active Problems:   Sepsis ([place])   Pneumonia    Time spent: 71 minutes   [facility] Pager [phone]. If 7PM-7AM, please contact night-coverage at [email or web address], password [facility] 03/24/2015, 1:59 PM  LOS: 3 days

## 2015-03-25 DIAGNOSIS — A403 Sepsis due to Streptococcus pneumoniae: Principal | ICD-10-CM

## 2015-03-25 LAB — GLUCOSE, CAPILLARY
Glucose-Capillary: 91 mg/dL (ref 65–99)
Glucose-Capillary: 117 mg/dL — ABNORMAL HIGH (ref 65–99)
Glucose-Capillary: 104 mg/dL — ABNORMAL HIGH (ref 65–99)
Glucose-Capillary: 144 mg/dL — ABNORMAL HIGH (ref 65–99)

## 2015-03-25 LAB — COMPREHENSIVE METABOLIC PANEL
ALK PHOS: 82 U/L (ref 38–126)
ALT: 34 U/L (ref 17–63)
AST: 49 U/L — AB (ref 15–41)
Albumin: 2.3 g/dL — ABNORMAL LOW (ref 3.5–5.0)
Anion gap: 8 (ref 5–15)
BILIRUBIN TOTAL: 1.2 mg/dL (ref 0.3–1.2)
BUN: 10 mg/dL (ref 6–20)
CO2: 25 mmol/L (ref 22–32)
CREATININE: 0.75 mg/dL (ref 0.61–1.24)
Calcium: 8.7 mg/dL — ABNORMAL LOW (ref 8.9–10.3)
Chloride: 101 mmol/L (ref 101–111)
GFR calc non Af Amer: >60 mL/min (ref >60)
Glucose, Bld: 91 mg/dL (ref 65–99)
Potassium: 4.1 mmol/L (ref 3.5–5.1)
Sodium: 134 mmol/L — ABNORMAL LOW (ref 135–145)
Total Protein: 7.1 g/dL (ref 6.5–8.1)

## 2015-03-25 LAB — CBC
HEMATOCRIT: 30 % — AB (ref 39.0–52.0)
HEMOGLOBIN: 10.2 g/dL — AB (ref 13.0–17.0)
MCH: 29.7 pg (ref 26.0–34.0)
MCHC: 34 g/dL (ref 30.0–36.0)
MCV: 87.2 fL (ref 78.0–100.0)
PLATELETS: 139 10*3/uL — AB (ref 150–400)
RBC: 3.44 MIL/uL — ABNORMAL LOW (ref 4.22–5.81)
RDW: 12.5 % (ref 11.5–15.5)
WBC: 16.4 10*3/uL — AB (ref 4.0–10.5)

## 2015-03-25 MED ORDER — OXYCODONE HCL 5 MG PO TABS
5.0000 mg | ORAL_TABLET | Freq: Four times a day (QID) | ORAL | Status: DC | PRN
  Administered 2015-03-25 – 2015-03-26 (×3): 5 mg via ORAL
  Filled 2015-03-25 (×3): qty 1

## 2015-03-25 MED ORDER — PNEUMOCOCCAL VAC POLYVALENT 25 MCG/0.5ML IJ INJ
0.5000 mL | INJECTION | INTRAMUSCULAR | Status: AC
  Administered 2015-03-27: 0.5 mL via INTRAMUSCULAR
  Filled 2015-03-25: qty 0.5

## 2015-03-25 MED ORDER — MORPHINE SULFATE (PF) 2 MG/ML IV SOLN
1.0000 mg | INTRAVENOUS | Status: DC | PRN
  Administered 2015-03-25 (×2): 2 mg via INTRAVENOUS
  Filled 2015-03-25 (×2): qty 1

## 2015-03-25 NOTE — Care Management Note (Signed)
Case Management Note  Patient Details  Name: Jim PotashLarry L Gordon MRN: [id number] Date of Birth: 04/05/1965  Subjective/Objective:                    Action/Plan:  UR updated  Expected Discharge Date:  03/25/15               Expected Discharge Plan:  Home/Self Care  In-House Referral:     Discharge planning Services     Post Acute Care Choice:    Choice offered to:     DME Arranged:    DME Agency:     HH Arranged:    HH Agency:     Status of Service:  In process, will continue to follow  Medicare Important Message Given:    Date Medicare IM Given:    Medicare IM give by:    Date Additional Medicare IM Given:    Additional Medicare Important Message give by:     If discussed at Long Length of Stay Meetings, dates discussed:    Additional Comments:  Jim Gordon, Jim Friscia Marie, RN 03/25/2015, 11:50 AM

## 2015-03-25 NOTE — Clinical Documentation Improvement (Signed)
Hospitalist  (Query responses must be documented in the progress notes and discharge summary, not on the CDI BPA itself.)  Please document the Acuity and Type of CHF in the progress notes and discharge summary.  Clinical  Information: Echo 021/02/16 - Left ventricle: The cavity size was normal. There was moderate concentric hypertrophy. Systolic function was normal. Wall motion was normal; there were no regional wall motion abnormalities. Doppler parameters are consistent with abnormal left ventricular relaxation (grade 1 diastolic dysfunction). There was no evidence of elevated ventricular filling pressure by Doppler parameters.  Systemic veins: Inferior vena cava: The vessel was &dilated. The respirophasic diameter changes were blunted (< 50%), consistent with elevated central venous pressure.   Progress Note 03/24/15 Dr. Susie CassetteAbrol - "Cont iv abx , day 4 , repeat CXR Showed B/L PNA and CHF  Febrile last night with worsening WBC  DC IVF , administer Lasix to hopefully improve oxygen requirements    Please exercise your independent, professional judgment when responding. A specific answer is not anticipated or expected.   Thank You, Jerral Ralphathy R Clarine Elrod  RN BSN CCDS [phone] Health Information Management 

## 2015-03-25 NOTE — Progress Notes (Signed)
[facility] PROGRESS NOTE  Jim Gordon EMV:[id number] DOB: 1965-04-08 DOA: 03/21/2015 PCP: No PCP Per Patient  Length of stay: 4   Assessment/Plan: Active Problems:   Sepsis ([place])   Pneumonia   HISTORY OF PRESENT ILLNESS 50 yr old  presented with fever and body aches starting earlier this week with a cough & sneezing. He endorsed increased dyspnea and fatigue as well. Starting 12/31 he noted fever and chills. He endorsed a cough productive of bright yellow phlegm. At the time he was not having any diarrhea or emesis. On arrival the patient was found to have a rectal temperature of 103.44F and hypoxia on room air to 87%. His lactic acid was elevated at 6.7. CT 03/22/15 right middle lobe and bibasilar consolidation with upper lobe ground-glass and airspace opacities, most consistent with pneumonia.   Assessment and plan  Acute Hypoxic Respiratory Failure Bilateral Pneumonia due to strep pneumo, blood culture positive for gram-positive cocci which resulted 03/23/15, previous blood culture showed staph coag negative Respiratory Panel+ for Adenovirus Supplement oxygen to maintain saturation >92%, on 5L  CT chest as reported below Cont iv abx, day 4 , repeat CXR  Showed B/L PNA and possible fluid overload. Echocardiogram shows normal systolic function. I think the findings are all related to his pneumonia. Patient was given a dose of Lasix. Continue to monitor for now. No evidence for congestive heart failure at this time. Continues to have low-grade fever. WBC slightly improved this morning. Repeat blood cultures obtained yesterday. Continue current antibiotics. Positive blood cultures obtained initially, likely contaminant. Repeat blood culture reports are pending.  Tachycardia suspect from PNA / hypoxia  H/O HTN - Not currently on any medications. Seems to be stable. Monitor patient on telemetry Prn hydralazine. Mildly elevated troponin, likely due to demand ischemia. No  wall motion abnormalities noted on echocardiogram.   Acute Renal Failure Lactic Acidosis - Improving. Hypomagesium  improving AKI  1.97>1.09 Replete k and mg  Lasix x1   Mild Anemia  Mild Thrombocytopenia  Stable blood counts.  Sepsis lactate 6.7 on admission, with bacteremia, gram positive cocci Bilateral Pneumonia> +strep pneuomia urine  1/1 PCT >175  Continuing Levaquin and Vancomycin Trend PCT  Follow cx data  Sputum culture no growth so far Urine Legionella pending , streptococcal antigen positive Respiratory viral panel PCR pending  & influenza PCR negative  Droplet isolation   Hyperglycemia - No h/o DM. Cont SSI   H/O Heavy EtOH Use - No h/o withdrawal but has never stopped. 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 CRP elevated at 15, ESR only 26 P: total C3 & C4 pending. Will need to establish care as an outpatient.    DVT prophylaxis heparin  Code Status:      Code Status Orders        Start     Ordered   03/22/15 0211  Full code   Continuous     03/22/15 0216      Family Communication: Discussed with the patient and his daughter  Disposition Plan: Continue telemetry, not stable for discharge    Consultants:  PCCM  MICROBIOLOGY: Blood Ctx x2 12/31 ([facility])>>> 1 was positive for coag-negative staph Urine Ctx 12/31 ([facility])>>> Strep Pneumonia Urine ag >Positive  Legionella urine >>  ANTIBIOTICS: Vancomycin 1/1>>> Levaquin 12/31>>>  SIGNIFICANT EVENTS: 12/31 - Presented to [facility]  01/01 - Transfer to [facility]  Echocardiogram Study Conclusions - Left ventricle: The cavity size was normal. There was moderateconcentric hypertrophy. Systolic function was normal. Wall motionwas normal; there were no regional wall motion abnormalities.Doppler parameters are consistent with abnormal left ventricularrelaxation (grade 1 diastolic  dysfunction). There was no evidenceof elevated ventricular filling pressure by Doppler parameters. - Aortic valve: Trileaflet; normal thickness leaflets. There was noregurgitation. - Aortic root: The aortic root was normal in size. - Mitral valve: Structurally normal valve. - Left atrium: The atrium was normal in size. - Right ventricle: Systolic function was normal. - Tricuspid valve: There was mild regurgitation. - Pulmonic valve: There was no regurgitation. - Inferior vena cava: The vessel was dilated. The respirophasicdiameter changes were blunted (< 50%), consistent with elevatedcentral venous pressure.  LINES/TUBES: PIV x2   Antibiotics: Anti-infectives    Start     Dose/Rate Route Frequency Ordered Stop   03/22/15 2100  levofloxacin (LEVAQUIN) IVPB 750 mg     750 mg 100 mL/hr over 90 Minutes Intravenous Every 24 hours 03/22/15 0110     03/22/15 0200  vancomycin (VANCOCIN) IVPB 750 mg/150 ml premix     750 mg 150 mL/hr over 60 Minutes Intravenous Every 12 hours 03/22/15 0116     03/21/15 2115  levofloxacin (LEVAQUIN) IVPB 750 mg     750 mg 100 mL/hr over 90 Minutes Intravenous  Once 03/21/15 2103 03/21/15 2204         Subjective: Patient complains of pain allover. States that his breathing is improved. Continues to have some cough and denies chest pains. No nausea, vomiting.    Objective: Filed Vitals:   03/24/15 1439 03/24/15 2127 03/25/15 0457 03/25/15 0537  BP: 161/96 155/92 180/99 156/84  Pulse: 97 96 90   Temp:  100.2 F (37.9 C) 99.2 F (37.3 C)   TempSrc:  Oral Oral   Resp:  18 18   Height:      Weight:  76.885 kg (169 lb 8 oz) 76.885 kg (169 lb 8 oz)   SpO2: 99% 97% 97%     Intake/Output Summary (Last 24 hours) at 03/25/15 0811 Last data filed at 03/25/15 0505  Gross per 24 hour  Intake 3487.08 ml  Output   5100 ml  Net -1612.92 ml    Exam:  General: NAD, following commands  Cardiovascular: S1, S2 is normal, regular No edema. No  appreciable JVD.  Pulmonary: Speaking in complete sentences. Crackles heard at bases. No wheezing Abdomen: Soft. Normal bowel sounds. Nondistended. Grossly nontender. Musculoskeletal: Normal bulk and tone. Hand grip strength 5/5 bilaterally. No joint deformity or effusion appreciated. Neurological: no focal deficits noted . Moving all 4 extremities equally.    Data Review   Micro Results Recent Results (from the past 240 hour(s))  Blood Culture (routine x 2)     Status: None (Preliminary result)   Collection Time: 03/21/15  8:25 PM  Result Value Ref Range Status   Specimen Description BLOOD LEFT ANTECUBITAL DRAWN BY RN  Final   Special Requests BOTTLES DRAWN AEROBIC ONLY 4CC ONLY  Final   Culture NO GROWTH 3 DAYS  Final   Report Status PENDING  Incomplete  Urine culture     Status: None   Collection Time: 03/21/15  8:25 PM  Result Value Ref Range Status   Specimen Description URINE, CLEAN CATCH  Final   Special Requests NONE  Final   Culture   Final    NO GROWTH 2 DAYS Performed at [facility]    Report Status 03/24/2015 FINAL  Final  Blood Culture (routine x 2)     Status: None   Collection Time: 03/21/15  8:28 PM  Result Value Ref Range Status   Specimen Description BLOOD RIGHT ANTECUBITAL  Final   Special Requests BOTTLES DRAWN AEROBIC AND ANAEROBIC 5CC EACH  Final   Culture  Setup Time   Final    GRAM POSITIVE COCCI AEROBIC BOTTLE Gram Stain Report Called to,Read Back By and Verified With: HABIB,I ON 03/23/15 _0  BY MITCHELL,S Performed at [facility]    Culture   Final    STAPHYLOCOCCUS SPECIES (COAGULASE NEGATIVE) THE SIGNIFICANCE OF ISOLATING THIS ORGANISM FROM A SINGLE SET OF BLOOD CULTURES WHEN MULTIPLE SETS ARE DRAWN IS UNCERTAIN. PLEASE NOTIFY THE MICROBIOLOGY DEPARTMENT WITHIN ONE WEEK IF SPECIATION AND SENSITIVITIES ARE REQUIRED. Performed at [facility]    Report Status 03/24/2015 FINAL  Final  MRSA PCR Screening     Status: None    Collection Time: 03/21/15 11:24 PM  Result Value Ref Range Status   MRSA by PCR NEGATIVE NEGATIVE Final    Comment:        The GeneXpert MRSA Assay (FDA approved for NASAL specimens only), is one component of a comprehensive MRSA colonization surveillance program. It is not intended to diagnose MRSA infection nor to guide or monitor treatment for MRSA infections.   Respiratory virus panel     Status: Abnormal   Collection Time: 03/22/15  3:35 AM  Result Value Ref Range Status   Source - RVPAN NASAL SWAB  Corrected   Respiratory Syncytial Virus A Negative Negative Final   Respiratory Syncytial Virus B Negative Negative Final   Influenza A Negative Negative Final   Influenza B Negative Negative Final   Parainfluenza 1 Negative Negative Final   Parainfluenza 2 Negative Negative Final   Parainfluenza 3 Negative Negative Final   Metapneumovirus Negative Negative Final   Rhinovirus Negative Negative Final   Adenovirus Positive (A) Negative Final    Comment: (NOTE) Positive for Adenovirus Species B/E Performed At: [facility] [address] [place], [place] [id number] Lindon Romp MD TD:[id number]   Culture, expectorated sputum-assessment     Status: None   Collection Time: 03/23/15  7:15 PM  Result Value Ref Range Status   Specimen Description SPUTUM  Final   Special Requests NONE  Final   Sputum evaluation   Final    THIS SPECIMEN IS ACCEPTABLE. RESPIRATORY CULTURE REPORT TO FOLLOW.   Report Status 03/23/2015 FINAL  Final    Radiology Reports Ct Chest Wo Contrast  03/22/2015  CLINICAL DATA:  Chest pain and shortness of breath. History of lupus. EXAM: CT CHEST WITHOUT CONTRAST TECHNIQUE: Multidetector CT imaging of the chest was performed following the standard protocol without IV contrast. COMPARISON:  Plain films of 1 day prior and 06/09/2012. No prior CT. FINDINGS: Mediastinum/Nodes: Normal heart size, without pericardial effusion. Advanced multivessel  coronary artery atherosclerosis, including within the LAD and branches on image 38/series 2 and right coronary artery on image 40/series 2. No mediastinal or definite hilar adenopathy, given limitations of unenhanced CT. Lungs/Pleura: Trace right pleural fluid. Right middle lobe near complete consolidation. Patchy bibasilar airspace disease. Peribronchovascular left greater than right upper lobe predominant airspace and ground-glass opacities. No extra alveolar air. Upper abdomen: Mild hepatic steatosis. Normal imaged portions of the spleen, stomach, adrenal glands, kidneys. Musculoskeletal: No acute osseous abnormality. Advanced degenerative disc disease at C6-7. IMPRESSION: 1. right middle lobe and  bibasilar consolidation with upper lobe ground-glass and airspace opacities, most consistent with pneumonia. Given the appearance of the upper lobes, atypical etiologies should be considered. 2. Age advanced coronary artery atherosclerosis. Recommend assessment of coronary risk factors and consideration of medical therapy. 3. Trace right pleural fluid. 4. Hepatic steatosis. Electronically Signed   By: Abigail Miyamoto M.D.   On: 03/22/2015 11:04   Dg Chest Port 1 View  03/24/2015  CLINICAL DATA:  Shortness of breath and cough EXAM: PORTABLE CHEST 1 VIEW COMPARISON:  March 23, 2015 chest radiograph and chest CT March 22, 2015 FINDINGS: There is persistent patchy airspace opacity throughout both mid and lower lung zones, essentially stable compared to 1 day prior. There is a small right pleural effusion, stable. There is stable cardiomegaly. The pulmonary vascularity is within normal limits. No adenopathy apparent. IMPRESSION: Cardiomegaly with small right effusion. Airspace opacity bilaterally may represent a somewhat atypical appearance of edema. All of these changes may be secondary to congestive heart failure, although superimposed pneumonia in the lower lung zones is questioned. The appearance on the recent CT is  concerning for potential infectious etiology in the bases, possibly superimposed on pulmonary edema. Electronically Signed   By: Lowella Grip III M.D.   On: 03/24/2015 07:45   Dg Chest Port 1 View  03/23/2015  CLINICAL DATA:  50 year old hypertensive male with pneumonia. Lupus. Subsequent encounter. EXAM: PORTABLE CHEST 1 VIEW COMPARISON:  03/22/2015 and chest CT.  03/21/2015 chest x-ray. FINDINGS: Slight worsening of left lower lobe consolidation. Persistent consolidation right middle lobe and right lower lobe. CT detected patchy nodular airspace disease throughout remainder of lungs better appreciated on recent chest CT. Findings may be related to atypical infection but will need to be followed interval complete clearance to exclude underlying mass or changes of inflammatory disease. No pneumothorax. Cardiomegaly. IMPRESSION: Slight worsening of left lower lobe consolidation. Persistent consolidation right middle lobe and right lower lobe. CT detected patchy nodular airspace disease throughout remainder of lungs better appreciated on recent chest CT. Electronically Signed   By: Genia Del M.D.   On: 03/23/2015 07:45   Dg Chest Port 1 View  03/21/2015  CLINICAL DATA:  50 year old male with chest congestion, cough, shortness of breath and fever. EXAM: PORTABLE CHEST 1 VIEW COMPARISON:  06/09/2012 FINDINGS: Bilateral lower lung airspace disease is compatible with pneumonia. There is no evidence of pleural effusion or pneumothorax. No acute bony abnormalities are noted. IMPRESSION: Bilateral lower lung airspace disease compatible with pneumonia. Radiographic follow-up to resolution is recommended. Electronically Signed   By: Margarette Canada M.D.   On: 03/21/2015 20:48     CBC  Recent Labs Lab 03/21/15 2026 03/22/15 0307 03/23/15 0232 03/24/15 0533 03/25/15 0329  WBC 5.3 3.7* 14.1* 19.0* 16.4*  HGB 12.7* 12.0* 11.4* 10.4* 10.2*  HCT 38.1* 35.1* 33.1* 30.3* 30.0*  PLT 151 128* 130* 148* 139*   MCV 91.4 89.5 86.0 86.1 87.2  MCH 30.5 30.6 29.6 29.5 29.7  MCHC 33.3 34.2 34.4 34.3 34.0  RDW 12.5 12.6 12.1 12.3 12.5  LYMPHSABS 0.7 0.6*  --   --   --   MONOABS 0.0* 0.1  --   --   --   EOSABS 0.0 0.0  --   --   --   BASOSABS 0.0 0.0  --   --   --     Chemistries   Recent Labs Lab 03/21/15 2026 03/22/15 0307 03/23/15 0232 03/24/15 0533 03/25/15 0329  NA 136 134* 134* 133*  134*  K 3.5 4.4 3.1* 4.0 4.1  CL 101 104 103 103 101  CO2 21* 20* _0 GLUCOSE 161* 103* 84 91 91  BUN 22* 23* _1 CREATININE 1.97* 1.67* 1.09 0.77 0.75  CALCIUM 8.5* 7.7* 7.9* 8.3* 8.7*  MG  --  1.1* 1.6*  --   --   AST 68*  --  51* 53* 49*  ALT <5*  --  33 35 34  ALKPHOS 49  --  32* 70 82  BILITOT 0.8  --  1.3* 1.5* 1.2   Cardiac Enzymes  Recent Labs Lab 03/22/15 0307 03/22/15 1037 03/22/15 1645  TROPONINI 0.04* 0.04* 0.12*   ------------------------------------------------------------------------------------------------------------------ Invalid input(s): POCBNP   CBG:  Recent Labs Lab 03/24/15 0720 03/24/15 1140 03/24/15 1634 03/24/15 2126 03/25/15 0728  GLUCAP 87 205* 176* 137* 91       Studies: Dg Chest Port 1 View  03/24/2015  CLINICAL DATA:  Shortness of breath and cough EXAM: PORTABLE CHEST 1 VIEW COMPARISON:  March 23, 2015 chest radiograph and chest CT March 22, 2015 FINDINGS: There is persistent patchy airspace opacity throughout both mid and lower lung zones, essentially stable compared to 1 day prior. There is a small right pleural effusion, stable. There is stable cardiomegaly. The pulmonary vascularity is within normal limits. No adenopathy apparent. IMPRESSION: Cardiomegaly with small right effusion. Airspace opacity bilaterally may represent a somewhat atypical appearance of edema. All of these changes may be secondary to congestive heart failure, although superimposed pneumonia in the lower lung zones is questioned. The appearance on the recent CT is  concerning for potential infectious etiology in the bases, possibly superimposed on pulmonary edema. Electronically Signed   By: Lowella Grip III M.D.   On: 03/24/2015 07:45      Lab Results  Component Value Date   HGBA1C 5.3 03/22/2015   Lab Results  Component Value Date   [facility]  01/24/2010    59        Total Cholesterol/HDL:CHD Risk Coronary Heart Disease Risk Table                     Men   Women  1/2 Average Risk   3.4   3.3  Average Risk       5.0   4.4  2 X Average Risk   9.6   7.1  3 X Average Risk  23.4   11.0        Use the calculated Patient Ratio above and the CHD Risk Table to determine the patient's CHD Risk.        ATP III CLASSIFICATION (LDL):  <100     mg/dL   Optimal  100-129  mg/dL   Near or Above                    Optimal  130-159  mg/dL   Borderline  160-189  mg/dL   High  >190     mg/dL   Very High   CREATININE 0.75 03/25/2015       Scheduled Meds: . antiseptic oral rinse  7 mL Mouth Rinse BID  . folic acid  1 mg Oral Daily  . heparin  5,000 Units Subcutaneous 3 times per day  . insulin aspart  0-15 Units Subcutaneous TID WC  . insulin aspart  0-5 Units Subcutaneous QHS  . levofloxacin (LEVAQUIN) IV  750 mg Intravenous Q24H  . multivitamin with minerals  1  tablet Oral Daily  . [START ON 03/26/2015] pneumococcal 23 valent vaccine  0.5 mL Intramuscular Tomorrow-1000  . thiamine  100 mg Oral Daily  . vancomycin  750 mg Intravenous Q12H   Continuous Infusions:    Active Problems:   Sepsis ([place])   Pneumonia    Time spent: 3 minutes   [facility] Pager [phone].   If 7PM-7AM, please contact night-coverage at [email or web address], password [facility] 03/25/2015, 8:11 AM  LOS: 4 days

## 2015-03-26 ENCOUNTER — Inpatient Hospital Stay (HOSPITAL_COMMUNITY): Payer: Medicaid Other

## 2015-03-26 LAB — BASIC METABOLIC PANEL
ANION GAP: 7 (ref 5–15)
BUN: 10 mg/dL (ref 6–20)
CALCIUM: 8.7 mg/dL — AB (ref 8.9–10.3)
CO2: 24 mmol/L (ref 22–32)
Chloride: 100 mmol/L — ABNORMAL LOW (ref 101–111)
Creatinine, Ser: 0.54 mg/dL — ABNORMAL LOW (ref 0.61–1.24)
Glucose, Bld: 107 mg/dL — ABNORMAL HIGH (ref 65–99)
Potassium: 3.8 mmol/L (ref 3.5–5.1)
SODIUM: 131 mmol/L — AB (ref 135–145)

## 2015-03-26 LAB — CBC
HCT: 29.5 % — ABNORMAL LOW (ref 39.0–52.0)
Hemoglobin: 10.2 g/dL — ABNORMAL LOW (ref 13.0–17.0)
MCH: 30.4 pg (ref 26.0–34.0)
MCHC: 34.6 g/dL (ref 30.0–36.0)
MCV: 87.8 fL (ref 78.0–100.0)
PLATELETS: 185 10*3/uL (ref 150–400)
RBC: 3.36 MIL/uL — ABNORMAL LOW (ref 4.22–5.81)
RDW: 12.4 % (ref 11.5–15.5)
WBC: 16.5 10*3/uL — AB (ref 4.0–10.5)

## 2015-03-26 LAB — CULTURE, RESPIRATORY W GRAM STAIN: Culture: NORMAL

## 2015-03-26 LAB — CULTURE, BLOOD (ROUTINE X 2): CULTURE: NO GROWTH

## 2015-03-26 LAB — CULTURE, RESPIRATORY

## 2015-03-26 LAB — GLUCOSE, CAPILLARY
GLUCOSE-CAPILLARY: 145 mg/dL — AB (ref 65–99)
GLUCOSE-CAPILLARY: 94 mg/dL (ref 65–99)
GLUCOSE-CAPILLARY: 102 mg/dL — AB (ref 65–99)
Glucose-Capillary: 103 mg/dL — ABNORMAL HIGH (ref 65–99)

## 2015-03-26 LAB — VANCOMYCIN, TROUGH

## 2015-03-26 MED ORDER — OXYCODONE HCL 5 MG PO TABS
5.0000 mg | ORAL_TABLET | ORAL | Status: DC | PRN
  Administered 2015-03-26 – 2015-03-27 (×3): 5 mg via ORAL
  Filled 2015-03-26 (×3): qty 1

## 2015-03-26 MED ORDER — TECHNETIUM TO 99M ALBUMIN AGGREGATED
3.0000 | Freq: Once | INTRAVENOUS | Status: AC | PRN
  Administered 2015-03-26: 3 via INTRAVENOUS

## 2015-03-26 MED ORDER — LEVOFLOXACIN 750 MG PO TABS
750.0000 mg | ORAL_TABLET | Freq: Every day | ORAL | Status: DC
  Administered 2015-03-26 – 2015-03-27 (×2): 750 mg via ORAL
  Filled 2015-03-26 (×2): qty 1

## 2015-03-26 NOTE — Progress Notes (Addendum)
[facility] PROGRESS NOTE  Jim Gordon ZOX:[id number] DOB: [birth year] DOA: 03/21/2015 PCP: No PCP Per Patient  Length of stay: 5   Assessment/Plan: Active Problems:   Sepsis ([place])   Pneumonia   HISTORY OF PRESENT ILLNESS 50 yr old  presented with fever and body aches starting earlier this week with a cough & sneezing. He endorsed increased dyspnea and fatigue as well. Starting 12/31 he noted fever and chills. He endorsed a cough productive of bright yellow phlegm. At the time he was not having any diarrhea or emesis. On arrival the patient was found to have a rectal temperature of 103.25F and hypoxia on room air to 87%. His lactic acid was elevated at 6.7. CT 03/22/15 right middle lobe and bibasilar consolidation with upper lobe ground-glass and airspace opacities, most consistent with pneumonia.   Assessment and plan  Acute Hypoxic Respiratory Failure Bilateral Pneumonia due to strep pneumo, blood culture positive for gram-positive cocci which resulted 03/23/15, previous blood culture showed staph coag negative Respiratory Panel+ for Adenovirus Supplement oxygen to maintain saturation >92%, on 3L  CT chest as reported below Cont iv abx, day 5 , repeat CXR  Showed B/L PNA and possible fluid overload. Echocardiogram shows normal systolic function. I think the findings are all related to his pneumonia. Patient was given a dose of Lasix. Continue to monitor for now. No evidence for congestive heart failure at this time. WBC slightly improved this morning. Repeat blood cultures obtained yesterday . Positive blood cultures obtained initially, likely contaminant. Repeat blood culture reports ngsf mrsa negative,dc vanc vq scan to r/o PE  Hyponatremia Sodium 131, trending down, SIADH?  Tachycardia suspect from PNA / hypoxia  H/O HTN - Not currently on any medications. Seems to be stable. Monitor patient on telemetry Prn hydralazine. Mildly elevated troponin, likely due to  demand ischemia. No wall motion abnormalities noted on echocardiogram.   Acute Renal Failure Lactic Acidosis - Improving. Hypomagesium  improving AKI  1.97>1.09 Replete k and mg  Received Lasix x1   Mild Anemia  Mild Thrombocytopenia  Stable blood counts.  Sepsis lactate 6.7 on admission, with bacteremia, gram positive cocci Bilateral Pneumonia> +strep pneuomia urine  1/1 PCT >175  Continuing Levaquin   Sputum culture no growth so far Urine Legionella pending , streptococcal antigen positive Respiratory viral panel PCR pending  & influenza PCR negative  Droplet isolation   Hyperglycemia - No h/o DM. Cont SSI   H/O Heavy EtOH Use - No h/o withdrawal but has never stopped. 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 CRP elevated at 15, ESR only 26 P: total C3 & C4 pending. Will need to establish care as an outpatient. Oxycodone for pain control PT eval  Acute on chronic diastolic HF  received one dose of lasix, cont lasix prn  DVT prophylaxis heparin  Code Status:      Code Status Orders        Start     Ordered   03/22/15 0211  Full code   Continuous     03/22/15 0216      Family Communication: Discussed with the patient and his daughter  Disposition Plan: Continue telemetry, not stable for discharge    Consultants:  PCCM  MICROBIOLOGY: Blood Ctx x2 12/31 ([facility])>>> 1 was positive for coag-negative staph Urine Ctx 12/31 ([facility])>>> Strep Pneumonia Urine ag >  Positive  Legionella urine >>  ANTIBIOTICS: Vancomycin 1/1>>> Levaquin 12/31>>>  SIGNIFICANT EVENTS: 12/31 - Presented to [facility] ED 01/01 - Transfer to [facility]  Echocardiogram Study Conclusions - Left ventricle: The cavity size was normal. There was moderateconcentric hypertrophy. Systolic function was normal. Wall motionwas normal; there were no regional wall motion abnormalities.Doppler  parameters are consistent with abnormal left ventricularrelaxation (grade 1 diastolic dysfunction). There was no evidenceof elevated ventricular filling pressure by Doppler parameters. - Aortic valve: Trileaflet; normal thickness leaflets. There was noregurgitation. - Aortic root: The aortic root was normal in size. - Mitral valve: Structurally normal valve. - Left atrium: The atrium was normal in size. - Right ventricle: Systolic function was normal. - Tricuspid valve: There was mild regurgitation. - Pulmonic valve: There was no regurgitation. - Inferior vena cava: The vessel was dilated. The respirophasicdiameter changes were blunted (< 50%), consistent with elevatedcentral venous pressure.  LINES/TUBES: PIV x2   Antibiotics: Anti-infectives    Start     Dose/Rate Route Frequency Ordered Stop   03/22/15 2100  levofloxacin (LEVAQUIN) IVPB 750 mg     750 mg 100 mL/hr over 90 Minutes Intravenous Every 24 hours 03/22/15 0110     03/22/15 0200  vancomycin (VANCOCIN) IVPB 750 mg/150 ml premix     750 mg 150 mL/hr over 60 Minutes Intravenous Every 12 hours 03/22/15 0116     03/21/15 2115  levofloxacin (LEVAQUIN) IVPB 750 mg     750 mg 100 mL/hr over 90 Minutes Intravenous  Once 03/21/15 2103 03/21/15 2204         Subjective:   breathing is improved. Continues to have some cough and denies chest pains. No nausea, vomiting.    Objective: Filed Vitals:   03/25/15 1600 03/25/15 2051 03/26/15 0500 03/26/15 1259  BP: 150/78 159/88 134/84 159/93  Pulse: 84 93 92 86  Temp:  98.6 F (37 C) 98.9 F (37.2 C) 98.3 F (36.8 C)  TempSrc:  Oral Oral Oral  Resp:  '18 17 18  ' Height:      Weight:   85.231 kg (187 lb 14.4 oz)   SpO2:  96% 95% 97%    Intake/Output Summary (Last 24 hours) at 03/26/15 1329 Last data filed at 03/26/15 1050  Gross per 24 hour  Intake   1150 ml  Output   2425 ml  Net  -1275 ml    Exam:  General: NAD, following commands  Cardiovascular: S1, S2  is normal, regular No edema. No appreciable JVD.  Pulmonary: Speaking in complete sentences. Crackles heard at bases. No wheezing Abdomen: Soft. Normal bowel sounds. Nondistended. Grossly nontender. Musculoskeletal: Normal bulk and tone. Hand grip strength 5/5 bilaterally. No joint deformity or effusion appreciated. Neurological: no focal deficits noted . Moving all 4 extremities equally.    Data Review   Micro Results Recent Results (from the past 240 hour(s))  Blood Culture (routine x 2)     Status: None   Collection Time: 03/21/15  8:25 PM  Result Value Ref Range Status   Specimen Description BLOOD LEFT ANTECUBITAL DRAWN BY RN  Final   Special Requests BOTTLES DRAWN AEROBIC ONLY 4CC ONLY  Final   Culture NO GROWTH 5 DAYS  Final   Report Status 03/26/2015 FINAL  Final  Urine culture     Status: None   Collection Time: 03/21/15  8:25 PM  Result Value Ref Range Status   Specimen Description URINE, CLEAN CATCH  Final   Special Requests NONE  Final   Culture  Final    NO GROWTH 2 DAYS Performed at [facility]    Report Status 03/24/2015 FINAL  Final  Blood Culture (routine x 2)     Status: None   Collection Time: 03/21/15  8:28 PM  Result Value Ref Range Status   Specimen Description BLOOD RIGHT ANTECUBITAL  Final   Special Requests BOTTLES DRAWN AEROBIC AND ANAEROBIC 5CC EACH  Final   Culture  Setup Time   Final    GRAM POSITIVE COCCI AEROBIC BOTTLE Gram Stain Report Called to,Read Back By and Verified With: HABIB,I ON 03/23/15 '@0930'  BY MITCHELL,S Performed at [facility]    Culture   Final    STAPHYLOCOCCUS SPECIES (COAGULASE NEGATIVE) THE SIGNIFICANCE OF ISOLATING THIS ORGANISM FROM A SINGLE SET OF BLOOD CULTURES WHEN MULTIPLE SETS ARE DRAWN IS UNCERTAIN. PLEASE NOTIFY THE MICROBIOLOGY DEPARTMENT WITHIN ONE WEEK IF SPECIATION AND SENSITIVITIES ARE REQUIRED. Performed at [facility]    Report Status 03/24/2015 FINAL  Final  MRSA PCR Screening      Status: None   Collection Time: 03/21/15 11:24 PM  Result Value Ref Range Status   MRSA by PCR NEGATIVE NEGATIVE Final    Comment:        The GeneXpert MRSA Assay (FDA approved for NASAL specimens only), is one component of a comprehensive MRSA colonization surveillance program. It is not intended to diagnose MRSA infection nor to guide or monitor treatment for MRSA infections.   Respiratory virus panel     Status: Abnormal   Collection Time: 03/22/15  3:35 AM  Result Value Ref Range Status   Source - RVPAN NASAL SWAB  Corrected   Respiratory Syncytial Virus A Negative Negative Final   Respiratory Syncytial Virus B Negative Negative Final   Influenza A Negative Negative Final   Influenza B Negative Negative Final   Parainfluenza 1 Negative Negative Final   Parainfluenza 2 Negative Negative Final   Parainfluenza 3 Negative Negative Final   Metapneumovirus Negative Negative Final   Rhinovirus Negative Negative Final   Adenovirus Positive (A) Negative Final    Comment: (NOTE) Positive for Adenovirus Species B/E Performed At: [facility] [address] [place], [place] [id number] Lindon Romp MD EG:[id number]   Culture, expectorated sputum-assessment     Status: None   Collection Time: 03/23/15  7:15 PM  Result Value Ref Range Status   Specimen Description SPUTUM  Final   Special Requests NONE  Final   Sputum evaluation   Final    THIS SPECIMEN IS ACCEPTABLE. RESPIRATORY CULTURE REPORT TO FOLLOW.   Report Status 03/23/2015 FINAL  Final  Culture, respiratory (NON-Expectorated)     Status: None   Collection Time: 03/23/15  7:15 PM  Result Value Ref Range Status   Specimen Description SPUTUM  Final   Special Requests NONE  Final   Gram Stain   Final    ABUNDANT WBC PRESENT,BOTH PMN AND MONONUCLEAR FEW SQUAMOUS EPITHELIAL CELLS PRESENT FEW GRAM POSITIVE COCCI IN PAIRS IN CLUSTERS Performed at [facility]    Culture   Final    NORMAL OROPHARYNGEAL  FLORA Performed at [facility]    Report Status 03/26/2015 FINAL  Final  Culture, blood (Routine X 2) w Reflex to ID Panel     Status: None (Preliminary result)   Collection Time: 03/24/15  6:30 PM  Result Value Ref Range Status   Specimen Description BLOOD RIGHT ANTECUBITAL  Final   Special Requests BOTTLES DRAWN AEROBIC AND ANAEROBIC 10ML  Final   Culture NO GROWTH 2 DAYS  Final   Report Status PENDING  Incomplete  Culture, blood (Routine X 2) w Reflex to ID Panel     Status: None (Preliminary result)   Collection Time: 03/24/15  6:36 PM  Result Value Ref Range Status   Specimen Description BLOOD LEFT ARM  Final   Special Requests BOTTLES DRAWN AEROBIC AND ANAEROBIC 10ML  Final   Culture NO GROWTH 2 DAYS  Final   Report Status PENDING  Incomplete    Radiology Reports Ct Chest Wo Contrast  03/22/2015  CLINICAL DATA:  Chest pain and shortness of breath. History of lupus. EXAM: CT CHEST WITHOUT CONTRAST TECHNIQUE: Multidetector CT imaging of the chest was performed following the standard protocol without IV contrast. COMPARISON:  Plain films of 1 day prior and 06/09/2012. No prior CT. FINDINGS: Mediastinum/Nodes: Normal heart size, without pericardial effusion. Advanced multivessel coronary artery atherosclerosis, including within the LAD and branches on image 38/series 2 and right coronary artery on image 40/series 2. No mediastinal or definite hilar adenopathy, given limitations of unenhanced CT. Lungs/Pleura: Trace right pleural fluid. Right middle lobe near complete consolidation. Patchy bibasilar airspace disease. Peribronchovascular left greater than right upper lobe predominant airspace and ground-glass opacities. No extra alveolar air. Upper abdomen: Mild hepatic steatosis. Normal imaged portions of the spleen, stomach, adrenal glands, kidneys. Musculoskeletal: No acute osseous abnormality. Advanced degenerative disc disease at C6-7. IMPRESSION: 1. right middle lobe and bibasilar  consolidation with upper lobe ground-glass and airspace opacities, most consistent with pneumonia. Given the appearance of the upper lobes, atypical etiologies should be considered. 2. Age advanced coronary artery atherosclerosis. Recommend assessment of coronary risk factors and consideration of medical therapy. 3. Trace right pleural fluid. 4. Hepatic steatosis. Electronically Signed   By: Abigail Miyamoto M.D.   On: 03/22/2015 11:04   Dg Chest Port 1 View  03/24/2015  CLINICAL DATA:  Shortness of breath and cough EXAM: PORTABLE CHEST 1 VIEW COMPARISON:  March 23, 2015 chest radiograph and chest CT March 22, 2015 FINDINGS: There is persistent patchy airspace opacity throughout both mid and lower lung zones, essentially stable compared to 1 day prior. There is a small right pleural effusion, stable. There is stable cardiomegaly. The pulmonary vascularity is within normal limits. No adenopathy apparent. IMPRESSION: Cardiomegaly with small right effusion. Airspace opacity bilaterally may represent a somewhat atypical appearance of edema. All of these changes may be secondary to congestive heart failure, although superimposed pneumonia in the lower lung zones is questioned. The appearance on the recent CT is concerning for potential infectious etiology in the bases, possibly superimposed on pulmonary edema. Electronically Signed   By: Lowella Grip III M.D.   On: 03/24/2015 07:45   Dg Chest Port 1 View  03/23/2015  CLINICAL DATA:  50 year old hypertensive male with pneumonia. Lupus. Subsequent encounter. EXAM: PORTABLE CHEST 1 VIEW COMPARISON:  03/22/2015 and chest CT.  03/21/2015 chest x-ray. FINDINGS: Slight worsening of left lower lobe consolidation. Persistent consolidation right middle lobe and right lower lobe. CT detected patchy nodular airspace disease throughout remainder of lungs better appreciated on recent chest CT. Findings may be related to atypical infection but will need to be followed interval  complete clearance to exclude underlying mass or changes of inflammatory disease. No pneumothorax. Cardiomegaly. IMPRESSION: Slight worsening of left lower lobe consolidation. Persistent consolidation right middle lobe and right lower lobe. CT detected patchy nodular airspace disease throughout remainder of lungs better appreciated on recent chest CT. Electronically Signed  By: Genia Del M.D.   On: 03/23/2015 07:45   Dg Chest Port 1 View  03/21/2015  CLINICAL DATA:  50 year old male with chest congestion, cough, shortness of breath and fever. EXAM: PORTABLE CHEST 1 VIEW COMPARISON:  06/09/2012 FINDINGS: Bilateral lower lung airspace disease is compatible with pneumonia. There is no evidence of pleural effusion or pneumothorax. No acute bony abnormalities are noted. IMPRESSION: Bilateral lower lung airspace disease compatible with pneumonia. Radiographic follow-up to resolution is recommended. Electronically Signed   By: Margarette Canada M.D.   On: 03/21/2015 20:48     CBC  Recent Labs Lab 03/21/15 2026 03/22/15 0307 03/23/15 0232 03/24/15 0533 03/25/15 0329 03/26/15 0630  WBC 5.3 3.7* 14.1* 19.0* 16.4* 16.5*  HGB 12.7* 12.0* 11.4* 10.4* 10.2* 10.2*  HCT 38.1* 35.1* 33.1* 30.3* 30.0* 29.5*  PLT 151 128* 130* 148* 139* 185  MCV 91.4 89.5 86.0 86.1 87.2 87.8  MCH 30.5 30.6 29.6 29.5 29.7 30.4  MCHC 33.3 34.2 34.4 34.3 34.0 34.6  RDW 12.5 12.6 12.1 12.3 12.5 12.4  LYMPHSABS 0.7 0.6*  --   --   --   --   MONOABS 0.0* 0.1  --   --   --   --   EOSABS 0.0 0.0  --   --   --   --   BASOSABS 0.0 0.0  --   --   --   --     Chemistries   Recent Labs Lab 03/21/15 2026 03/22/15 0307 03/23/15 0232 03/24/15 0533 03/25/15 0329 03/26/15 0630  NA 136 134* 134* 133* 134* 131*  K 3.5 4.4 3.1* 4.0 4.1 3.8  CL 101 104 103 103 101 100*  CO2 21* 20* '22 24 25 24  ' GLUCOSE 161* 103* 84 91 91 107*  BUN 22* 23* '19 10 10 10  ' CREATININE 1.97* 1.67* 1.09 0.77 0.75 0.54*  CALCIUM 8.5* 7.7* 7.9* 8.3*  8.7* 8.7*  MG  --  1.1* 1.6*  --   --   --   AST 68*  --  51* 53* 49*  --   ALT <5*  --  33 35 34  --   ALKPHOS 49  --  32* 70 82  --   BILITOT 0.8  --  1.3* 1.5* 1.2  --    Cardiac Enzymes  Recent Labs Lab 03/22/15 0307 03/22/15 1037 03/22/15 1645  TROPONINI 0.04* 0.04* 0.12*   ------------------------------------------------------------------------------------------------------------------ Invalid input(s): POCBNP   CBG:  Recent Labs Lab 03/25/15 1228 03/25/15 1731 03/25/15 2056 03/26/15 0733 03/26/15 1151  GLUCAP 117* 104* 144* 145* 94       Studies: No results found.    Lab Results  Component Value Date   HGBA1C 5.3 03/22/2015   Lab Results  Component Value Date   [facility]  01/24/2010    59        Total Cholesterol/HDL:CHD Risk Coronary Heart Disease Risk Table                     Men   Women  1/2 Average Risk   3.4   3.3  Average Risk       5.0   4.4  2 X Average Risk   9.6   7.1  3 X Average Risk  23.4   11.0        Use the calculated Patient Ratio above and the CHD Risk Table to determine the patient's CHD Risk.        ATP  III CLASSIFICATION (LDL):  <100     mg/dL   Optimal  100-129  mg/dL   Near or Above                    Optimal  130-159  mg/dL   Borderline  160-189  mg/dL   High  >190     mg/dL   Very High   CREATININE 0.54* 03/26/2015       Scheduled Meds: . folic acid  1 mg Oral Daily  . heparin  5,000 Units Subcutaneous 3 times per day  . insulin aspart  0-15 Units Subcutaneous TID WC  . insulin aspart  0-5 Units Subcutaneous QHS  . levofloxacin (LEVAQUIN) IV  750 mg Intravenous Q24H  . multivitamin with minerals  1 tablet Oral Daily  . pneumococcal 23 valent vaccine  0.5 mL Intramuscular Tomorrow-1000  . thiamine  100 mg Oral Daily  . vancomycin  750 mg Intravenous Q12H   Continuous Infusions:    Active Problems:   Sepsis ([place])   Pneumonia    Time spent: 42 minutes   [facility]  [facility] Pager  [phone]  If 7PM-7AM, please contact night-coverage at [email or web address], password [facility] 03/26/2015, 1:29 PM  LOS: 5 days

## 2015-03-26 NOTE — Evaluation (Signed)
Physical Therapy Evaluation Patient Details Name: Jim Gordon MRN: [id number] DOB: 01/02/1966 Today's Date: 03/26/2015   History of Present Illness  Patient is a 50 y/o male presents with fever, body aches, chills and SOB. In ED, pt found to be febrile with hypoxia on room air of 87%, lactic acid elevation at 6.7. CT- B/l PNA with possible fluid overload. PMH positive for HTN and discoid lupus.  Clinical Impression  Patient presents with functional limitations due to deficits listed in PT problem list (see below). Pt with decreased strength, endurance, balance and dyspnea on exertion impacting safe mobility. Pt requires use of RW for support/stability. Tolerated short distance ambulation on RA and able to maintain Sp02 >92% however 3/4 DOE requiring long seated rest break to recover. Pt's daughter works during the day. Anticipate with increased activity, pt will be able to progress to Mod I, without need for home 02 prior to return home. Encouraged daily walks with RN. Will follow acutely.     Follow Up Recommendations No PT follow up;Supervision - Intermittent    Equipment Recommendations  Rolling walker with 5" wheels    Recommendations for Other Services OT consult     Precautions / Restrictions Precautions Precautions: Fall Precaution Comments: Keep Sp02 >92%. Restrictions Weight Bearing Restrictions: No      Mobility  Bed Mobility               General bed mobility comments: Sitting in chair upon PT arrival.   Transfers Overall transfer level: Needs assistance Equipment used: Rolling walker (2 wheeled) Transfers: Sit to/from Stand Sit to Stand: Supervision         General transfer comment: Supervision for safety.   Ambulation/Gait   Ambulation Distance (Feet): 40 Feet Assistive device: Rolling walker (2 wheeled) Gait Pattern/deviations: Step-through pattern;Decreased stride length;Trunk flexed   Gait velocity interpretation: Below normal speed for  age/gender General Gait Details: Pt with sudden sharp cramp in abdomen resulting in almost LOB but able to self correct. Mostly steady gait with RW. 3/4 DOE. Sp02 remained >92% on RA. Cues for pursed lip breathing.  Stairs            Wheelchair Mobility    Modified Rankin (Stroke Patients Only)       Balance Overall balance assessment: Needs assistance Sitting-balance support: Feet supported;No upper extremity supported Sitting balance-Leahy Scale: Normal     Standing balance support: During functional activity Standing balance-Leahy Scale: Fair Standing balance comment: Requires UE support for dynamic standing.                             Pertinent Vitals/Pain Pain Assessment: Faces Faces Pain Scale: Hurts little more Pain Location: abdominal cramps Pain Descriptors / Indicators: Cramping Pain Intervention(s): Monitored during session;Repositioned    Home Living Family/patient expects to be discharged to:: Private residence Living Arrangements: Children Available Help at Discharge: Family;Available PRN/intermittently (daughter works) Type of Home: Mobile home Home Access: Stairs to enter Entrance Stairs-Rails: Right Entrance Stairs-Number of Steps: 3 Home Layout: One level Home Equipment: None      Prior Function Level of Independence: Independent         Comments: Works - "a little bit of everything."     Hand Dominance        Extremity/Trunk Assessment   Upper Extremity Assessment: Defer to OT evaluation           Lower Extremity Assessment: Generalized weakness  Communication   Communication: No difficulties  Cognition Arousal/Alertness: Awake/alert Behavior During Therapy: WFL for tasks assessed/performed Overall Cognitive Status: Within Functional Limits for tasks assessed                      General Comments General comments (skin integrity, edema, etc.): Discussed progression of exercise/activity  while in hospital and pursed lip breathing techniques.     Exercises        Assessment/Plan    PT Assessment Patient needs continued PT services  PT Diagnosis Difficulty walking;Generalized weakness   PT Problem List Decreased strength;Cardiopulmonary status limiting activity;Decreased activity tolerance;Decreased balance;Decreased mobility  PT Treatment Interventions Balance training;Gait training;Stair training;Functional mobility training;Therapeutic activities;Therapeutic exercise;Patient/family education   PT Goals (Current goals can be found in the Care Plan section) Acute Rehab PT Goals Patient Stated Goal: to return to independence  PT Goal Formulation: With patient Time For Goal Achievement: 04/09/15 Potential to Achieve Goals: Good    Frequency Min 3X/week   Barriers to discharge Decreased caregiver support Alone during the day    Co-evaluation               End of Session Equipment Utilized During Treatment: Gait belt;Oxygen Activity Tolerance: Patient tolerated treatment well Patient left: in chair;with call bell/phone within reach Nurse Communication: Mobility status         Time: 1345-1410 PT Time Calculation (min) (ACUTE ONLY): 25 min   Charges:   PT Evaluation $PT Eval Moderate Complexity: 1 Procedure PT Treatments $Gait Training: 8-22 mins   PT G Codes:        Jovontae Banko A Roxana Lai 03/26/2015, 2:38 PM Mylo RedShauna Brinna Divelbiss, PT, DPT [phone]

## 2015-03-27 LAB — COMPREHENSIVE METABOLIC PANEL
ALT: 35 U/L (ref 17–63)
AST: 37 U/L (ref 15–41)
Albumin: 2.5 g/dL — ABNORMAL LOW (ref 3.5–5.0)
Alkaline Phosphatase: 80 U/L (ref 38–126)
Anion gap: 8 (ref 5–15)
BILIRUBIN TOTAL: 0.8 mg/dL (ref 0.3–1.2)
BUN: 8 mg/dL (ref 6–20)
CO2: 28 mmol/L (ref 22–32)
CREATININE: 0.64 mg/dL (ref 0.61–1.24)
Calcium: 9 mg/dL (ref 8.9–10.3)
Chloride: 97 mmol/L — ABNORMAL LOW (ref 101–111)
Glucose, Bld: 105 mg/dL — ABNORMAL HIGH (ref 65–99)
Potassium: 4 mmol/L (ref 3.5–5.1)
Sodium: 133 mmol/L — ABNORMAL LOW (ref 135–145)
TOTAL PROTEIN: 7.4 g/dL (ref 6.5–8.1)

## 2015-03-27 LAB — CBC
HEMATOCRIT: 27.7 % — AB (ref 39.0–52.0)
Hemoglobin: 9.4 g/dL — ABNORMAL LOW (ref 13.0–17.0)
MCH: 30 pg (ref 26.0–34.0)
MCHC: 33.9 g/dL (ref 30.0–36.0)
MCV: 88.5 fL (ref 78.0–100.0)
Platelets: 251 10*3/uL (ref 150–400)
RBC: 3.13 MIL/uL — AB (ref 4.22–5.81)
RDW: 12.4 % (ref 11.5–15.5)
WBC: 16.7 10*3/uL — AB (ref 4.0–10.5)

## 2015-03-27 LAB — GLUCOSE, CAPILLARY
GLUCOSE-CAPILLARY: 125 mg/dL — AB (ref 65–99)
Glucose-Capillary: 117 mg/dL — ABNORMAL HIGH (ref 65–99)

## 2015-03-27 MED ORDER — ACETAMINOPHEN 500 MG PO TABS
500.0000 mg | ORAL_TABLET | Freq: Four times a day (QID) | ORAL | Status: DC | PRN
Start: 2015-03-27 — End: 2017-05-07

## 2015-03-27 MED ORDER — ALBUTEROL SULFATE HFA 108 (90 BASE) MCG/ACT IN AERS: 2.0000 | INHALATION_SPRAY | Freq: Four times a day (QID) | RESPIRATORY_TRACT | Status: DC | PRN

## 2015-03-27 MED ORDER — LEVOFLOXACIN 750 MG PO TABS: 750.0000 mg | ORAL_TABLET | Freq: Every day | ORAL | Status: DC

## 2015-03-27 MED ORDER — PREDNISONE 20 MG PO TABS: 20.0000 mg | ORAL_TABLET | Freq: Every day | ORAL | Status: DC

## 2015-03-27 MED ORDER — THIAMINE HCL 100 MG PO TABS: 100.0000 mg | ORAL_TABLET | Freq: Every day | ORAL | Status: DC

## 2015-03-27 MED ORDER — FUROSEMIDE 20 MG PO TABS: 20.0000 mg | ORAL_TABLET | Freq: Every day | ORAL | Status: DC

## 2015-03-27 MED ORDER — FOLIC ACID 1 MG PO TABS: 1.0000 mg | ORAL_TABLET | Freq: Every day | ORAL | Status: DC

## 2015-03-27 MED ORDER — OXYCODONE HCL 5 MG PO TABS: 5.0000 mg | ORAL_TABLET | ORAL | Status: DC | PRN

## 2015-03-27 NOTE — Discharge Summary (Addendum)
Physician Discharge Summary  DALON REICHART MRN: [id number] DOB/AGE: 1966/02/28 50 y.o.  PCP: No PCP Per Patient   Admit date: 03/21/2015 Discharge date: 03/27/2015  Discharge Diagnoses:     Active Problems:   Sepsis ([place])   Pneumonia    Follow-up recommendations Follow-up with PCP in 3-5 days , including all  additional recommended appointments as below Follow-up CBC, CMP in 3-5 days home health to follow for respiratory care      Medication List    TAKE these medications        acetaminophen 500 MG tablet  Commonly known as:  TYLENOL  Take 1 tablet (500 mg total) by mouth every 6 (six) hours as needed for mild pain, fever or headache.     albuterol 108 (90 Base) MCG/ACT inhaler  Commonly known as:  PROVENTIL HFA;VENTOLIN HFA  Inhale 2 puffs into the lungs every 6 (six) hours as needed for wheezing or shortness of breath.     folic acid 1 MG tablet  Commonly known as:  FOLVITE  Take 1 tablet (1 mg total) by mouth daily.     levofloxacin 750 MG tablet  Commonly known as:  LEVAQUIN  Take 1 tablet (750 mg total) by mouth daily.     oxyCODONE 5 MG immediate release tablet  Commonly known as:  Oxy IR/ROXICODONE  Take 1 tablet (5 mg total) by mouth every 4 (four) hours as needed for moderate pain.     thiamine 100 MG tablet  Take 1 tablet (100 mg total) by mouth daily.         Discharge Condition:  Stable  Discharge Instructions     Allergies  Allergen Reactions  . Bee Venom Swelling  . Penicillins Hives      Disposition: 01-Home or Self Care   Consults:      Significant Diagnostic Studies:  Dg Chest 2 View  03/26/2015  CLINICAL DATA:  Pleuritic chest pain, cough, shortness of Breath EXAM: CHEST  2 VIEW COMPARISON:  03/24/2015 FINDINGS: Cardiomediastinal silhouette is stable. Persistent patchy infiltrates in lingula and left lower lobe, persistent infiltrate in right middle lobe and right lower lobe posteromedially. Probable small right  pleural effusion. No pulmonary edema. Bony thorax is stable. IMPRESSION: Patchy infiltrates again noted in lingula, right middle lobe and bilateral lower lobe highly suspicious for multifocal pneumonia. Probable small right pleural effusion. No pulmonary edema Electronically Signed   By: Lahoma Crocker M.D.   On: 03/26/2015 15:45   Ct Chest Wo Contrast  03/22/2015  CLINICAL DATA:  Chest pain and shortness of breath. History of lupus. EXAM: CT CHEST WITHOUT CONTRAST TECHNIQUE: Multidetector CT imaging of the chest was performed following the standard protocol without IV contrast. COMPARISON:  Plain films of 1 day prior and 06/09/2012. No prior CT. FINDINGS: Mediastinum/Nodes: Normal heart size, without pericardial effusion. Advanced multivessel coronary artery atherosclerosis, including within the LAD and branches on image 38/series 2 and right coronary artery on image 40/series 2. No mediastinal or definite hilar adenopathy, given limitations of unenhanced CT. Lungs/Pleura: Trace right pleural fluid. Right middle lobe near complete consolidation. Patchy bibasilar airspace disease. Peribronchovascular left greater than right upper lobe predominant airspace and ground-glass opacities. No extra alveolar air. Upper abdomen: Mild hepatic steatosis. Normal imaged portions of the spleen, stomach, adrenal glands, kidneys. Musculoskeletal: No acute osseous abnormality. Advanced degenerative disc disease at C6-7. IMPRESSION: 1. right middle lobe and bibasilar consolidation with upper lobe ground-glass and airspace opacities, most consistent with pneumonia. Given the appearance  of the upper lobes, atypical etiologies should be considered. 2. Age advanced coronary artery atherosclerosis. Recommend assessment of coronary risk factors and consideration of medical therapy. 3. Trace right pleural fluid. 4. Hepatic steatosis. Electronically Signed   By: Abigail Miyamoto M.D.   On: 03/22/2015 11:04   Nm Pulmonary Perfusion  03/26/2015   CLINICAL DATA:  Chest pain with fever EXAM: NUCLEAR MEDICINE   PERFUSION SCAN TECHNIQUE: Perfusion images were obtained in multiple projections after intravenous injection of radiopharmaceutical. RADIOPHARMACEUTICALS:  Three mCi Tc76mMAA IV or Perfusion study not performed due to drop a precaution. Patient appears to have pneumonia. COMPARISON:  Chest x-ray 03/24/2015.  Chest CT 03/22/2015 FINDINGS: Mild patchy perfusion defects right middle lobe and left lower lobe corresponding to extensive infiltrate on the CT scan. No upper lobe perfusion defects. No areas concerning for pulmonary emboli. IMPRESSION: Patchy perfusion defects right middle lobe and right lower lobe corresponding to infiltrates and pneumonia on CT. Negative for pulmonary embolism. Electronically Signed   By: CFranchot GalloM.D.   On: 03/26/2015 15:35   Dg Chest Port 1 View  03/24/2015  CLINICAL DATA:  Shortness of breath and cough EXAM: PORTABLE CHEST 1 VIEW COMPARISON:  March 23, 2015 chest radiograph and chest CT March 22, 2015 FINDINGS: There is persistent patchy airspace opacity throughout both mid and lower lung zones, essentially stable compared to 1 day prior. There is a small right pleural effusion, stable. There is stable cardiomegaly. The pulmonary vascularity is within normal limits. No adenopathy apparent. IMPRESSION: Cardiomegaly with small right effusion. Airspace opacity bilaterally may represent a somewhat atypical appearance of edema. All of these changes may be secondary to congestive heart failure, although superimposed pneumonia in the lower lung zones is questioned. The appearance on the recent CT is concerning for potential infectious etiology in the bases, possibly superimposed on pulmonary edema. Electronically Signed   By: WLowella GripIII M.D.   On: 03/24/2015 07:45   Dg Chest Port 1 View  03/23/2015  CLINICAL DATA:  50year old hypertensive male with pneumonia. Lupus. Subsequent encounter. EXAM: PORTABLE CHEST  1 VIEW COMPARISON:  03/22/2015 and chest CT.  03/21/2015 chest x-ray. FINDINGS: Slight worsening of left lower lobe consolidation. Persistent consolidation right middle lobe and right lower lobe. CT detected patchy nodular airspace disease throughout remainder of lungs better appreciated on recent chest CT. Findings may be related to atypical infection but will need to be followed interval complete clearance to exclude underlying mass or changes of inflammatory disease. No pneumothorax. Cardiomegaly. IMPRESSION: Slight worsening of left lower lobe consolidation. Persistent consolidation right middle lobe and right lower lobe. CT detected patchy nodular airspace disease throughout remainder of lungs better appreciated on recent chest CT. Electronically Signed   By: SGenia DelM.D.   On: 03/23/2015 07:45   Dg Chest Port 1 View  03/21/2015  CLINICAL DATA:  50year old male with chest congestion, cough, shortness of breath and fever. EXAM: PORTABLE CHEST 1 VIEW COMPARISON:  06/09/2012 FINDINGS: Bilateral lower lung airspace disease is compatible with pneumonia. There is no evidence of pleural effusion or pneumothorax. No acute bony abnormalities are noted. IMPRESSION: Bilateral lower lung airspace disease compatible with pneumonia. Radiographic follow-up to resolution is recommended. Electronically Signed   By: JMargarette CanadaM.D.   On: 03/21/2015 20:48     Echo Study Conclusions  - Left ventricle: The cavity size was normal. There was moderate concentric hypertrophy. Systolic function was normal. Wall motion was normal; there were no regional wall motion  abnormalities. Doppler parameters are consistent with abnormal left ventricular relaxation (grade 1 diastolic dysfunction). There was no evidence of elevated ventricular filling pressure by Doppler parameters. - Aortic valve: Trileaflet; normal thickness leaflets. There was no regurgitation. - Aortic root: The aortic root was normal in  size. - Mitral valve: Structurally normal valve. - Left atrium: The atrium was normal in size. - Right ventricle: Systolic function was normal. - Tricuspid valve: There was mild regurgitation. - Pulmonic valve: There was no regurgitation. - Inferior vena cava: The vessel was dilated. The respirophasic diameter changes were blunted (< 50%), consistent with elevated central venous pressure.     Filed Weights   03/25/15 0457 03/26/15 0500 03/27/15 0500  Weight: 76.885 kg (169 lb 8 oz) 85.231 kg (187 lb 14.4 oz) 84.641 kg (186 lb 9.6 oz)     Microbiology: Recent Results (from the past 240 hour(s))  Blood Culture (routine x 2)     Status: None   Collection Time: 03/21/15  8:25 PM  Result Value Ref Range Status   Specimen Description BLOOD LEFT ANTECUBITAL DRAWN BY RN  Final   Special Requests BOTTLES DRAWN AEROBIC ONLY 4CC ONLY  Final   Culture NO GROWTH 5 DAYS  Final   Report Status 03/26/2015 FINAL  Final  Urine culture     Status: None   Collection Time: 03/21/15  8:25 PM  Result Value Ref Range Status   Specimen Description URINE, CLEAN CATCH  Final   Special Requests NONE  Final   Culture   Final    NO GROWTH 2 DAYS Performed at [facility]    Report Status 03/24/2015 FINAL  Final  Blood Culture (routine x 2)     Status: None   Collection Time: 03/21/15  8:28 PM  Result Value Ref Range Status   Specimen Description BLOOD RIGHT ANTECUBITAL  Final   Special Requests BOTTLES DRAWN AEROBIC AND ANAEROBIC 5CC EACH  Final   Culture  Setup Time   Final    GRAM POSITIVE COCCI AEROBIC BOTTLE Gram Stain Report Called to,Read Back By and Verified With: HABIB,I ON 03/23/15 '@0930'  BY MITCHELL,S Performed at [facility]    Culture   Final    STAPHYLOCOCCUS SPECIES (COAGULASE NEGATIVE) THE SIGNIFICANCE OF ISOLATING THIS ORGANISM FROM A SINGLE SET OF BLOOD CULTURES WHEN MULTIPLE SETS ARE DRAWN IS UNCERTAIN. PLEASE NOTIFY THE MICROBIOLOGY DEPARTMENT WITHIN ONE WEEK IF  SPECIATION AND SENSITIVITIES ARE REQUIRED. Performed at [facility]    Report Status 03/24/2015 FINAL  Final  MRSA PCR Screening     Status: None   Collection Time: 03/21/15 11:24 PM  Result Value Ref Range Status   MRSA by PCR NEGATIVE NEGATIVE Final    Comment:        The GeneXpert MRSA Assay (FDA approved for NASAL specimens only), is one component of a comprehensive MRSA colonization surveillance program. It is not intended to diagnose MRSA infection nor to guide or monitor treatment for MRSA infections.   Respiratory virus panel     Status: Abnormal   Collection Time: 03/22/15  3:35 AM  Result Value Ref Range Status   Source - RVPAN NASAL SWAB  Corrected   Respiratory Syncytial Virus A Negative Negative Final   Respiratory Syncytial Virus B Negative Negative Final   Influenza A Negative Negative Final   Influenza B Negative Negative Final   Parainfluenza 1 Negative Negative Final   Parainfluenza 2 Negative Negative Final   Parainfluenza 3 Negative Negative Final  Metapneumovirus Negative Negative Final   Rhinovirus Negative Negative Final   Adenovirus Positive (A) Negative Final    Comment: (NOTE) Positive for Adenovirus Species B/E Performed At: [facility] [address] [place], [place] [id number] Lindon Romp MD YP:[id number]   Culture, expectorated sputum-assessment     Status: None   Collection Time: 03/23/15  7:15 PM  Result Value Ref Range Status   Specimen Description SPUTUM  Final   Special Requests NONE  Final   Sputum evaluation   Final    THIS SPECIMEN IS ACCEPTABLE. RESPIRATORY CULTURE REPORT TO FOLLOW.   Report Status 03/23/2015 FINAL  Final  Culture, respiratory (NON-Expectorated)     Status: None   Collection Time: 03/23/15  7:15 PM  Result Value Ref Range Status   Specimen Description SPUTUM  Final   Special Requests NONE  Final   Gram Stain   Final    ABUNDANT WBC PRESENT,BOTH PMN AND MONONUCLEAR FEW SQUAMOUS  EPITHELIAL CELLS PRESENT FEW GRAM POSITIVE COCCI IN PAIRS IN CLUSTERS Performed at [facility]    Culture   Final    NORMAL OROPHARYNGEAL FLORA Performed at [facility]    Report Status 03/26/2015 FINAL  Final  Culture, blood (Routine X 2) w Reflex to ID Panel     Status: None (Preliminary result)   Collection Time: 03/24/15  6:30 PM  Result Value Ref Range Status   Specimen Description BLOOD RIGHT ANTECUBITAL  Final   Special Requests BOTTLES DRAWN AEROBIC AND ANAEROBIC 10ML  Final   Culture NO GROWTH 2 DAYS  Final   Report Status PENDING  Incomplete  Culture, blood (Routine X 2) w Reflex to ID Panel     Status: None (Preliminary result)   Collection Time: 03/24/15  6:36 PM  Result Value Ref Range Status   Specimen Description BLOOD LEFT ARM  Final   Special Requests BOTTLES DRAWN AEROBIC AND ANAEROBIC 10ML  Final   Culture NO GROWTH 2 DAYS  Final   Report Status PENDING  Incomplete       Blood Culture    Component Value Date/Time   SDES BLOOD LEFT ARM 03/24/2015 1836   SPECREQUEST BOTTLES DRAWN AEROBIC AND ANAEROBIC 10ML 03/24/2015 1836   CULT NO GROWTH 2 DAYS 03/24/2015 1836   REPTSTATUS PENDING 03/24/2015 1836      Labs: Results for orders placed or performed during the hospital encounter of 03/21/15 (from the past 48 hour(s))  Glucose, capillary     Status: Abnormal   Collection Time: 03/25/15 12:28 PM  Result Value Ref Range   Glucose-Capillary 117 (H) 65 - 99 mg/dL  Glucose, capillary     Status: Abnormal   Collection Time: 03/25/15  5:31 PM  Result Value Ref Range   Glucose-Capillary 104 (H) 65 - 99 mg/dL  Glucose, capillary     Status: Abnormal   Collection Time: 03/25/15  8:56 PM  Result Value Ref Range   Glucose-Capillary 144 (H) 65 - 99 mg/dL  CBC     Status: Abnormal   Collection Time: 03/26/15  6:30 AM  Result Value Ref Range   WBC 16.5 (H) 4.0 - 10.5 K/uL   RBC 3.36 (L) 4.22 - 5.81 MIL/uL   Hemoglobin 10.2 (L) 13.0 - 17.0 g/dL    HCT 29.5 (L) 39.0 - 52.0 %   MCV 87.8 78.0 - 100.0 fL   MCH 30.4 26.0 - 34.0 pg   MCHC 34.6 30.0 - 36.0 g/dL   RDW 12.4 11.5 -  15.5 %   Platelets 185 150 - 400 K/uL  Basic metabolic panel     Status: Abnormal   Collection Time: 03/26/15  6:30 AM  Result Value Ref Range   Sodium 131 (L) 135 - 145 mmol/L   Potassium 3.8 3.5 - 5.1 mmol/L   Chloride 100 (L) 101 - 111 mmol/L   CO2 24 22 - 32 mmol/L   Glucose, Bld 107 (H) 65 - 99 mg/dL   BUN 10 6 - 20 mg/dL   Creatinine, Ser 0.54 (L) 0.61 - 1.24 mg/dL   Calcium 8.7 (L) 8.9 - 10.3 mg/dL   GFR calc non Af Amer >60 >60 mL/min   GFR calc Af Amer >60 >60 mL/min    Comment: (NOTE) The eGFR has been calculated using the CKD EPI equation. This calculation has not been validated in all clinical situations. eGFR's persistently <60 mL/min signify possible Chronic Kidney Disease.    Anion gap 7 5 - 15  Glucose, capillary     Status: Abnormal   Collection Time: 03/26/15  7:33 AM  Result Value Ref Range   Glucose-Capillary 145 (H) 65 - 99 mg/dL  Glucose, capillary     Status: None   Collection Time: 03/26/15 11:51 AM  Result Value Ref Range   Glucose-Capillary 94 65 - 99 mg/dL  Vancomycin, trough     Status: Abnormal   Collection Time: 03/26/15  1:34 PM  Result Value Ref Range   Vancomycin Tr <4 (L) 10.0 - 20.0 ug/mL  Glucose, capillary     Status: Abnormal   Collection Time: 03/26/15  5:31 PM  Result Value Ref Range   Glucose-Capillary 102 (H) 65 - 99 mg/dL  Glucose, capillary     Status: Abnormal   Collection Time: 03/26/15  8:53 PM  Result Value Ref Range   Glucose-Capillary 103 (H) 65 - 99 mg/dL  Glucose, capillary     Status: Abnormal   Collection Time: 03/27/15  7:31 AM  Result Value Ref Range   Glucose-Capillary 125 (H) 65 - 99 mg/dL  CBC     Status: Abnormal   Collection Time: 03/27/15  8:12 AM  Result Value Ref Range   WBC 16.7 (H) 4.0 - 10.5 K/uL   RBC 3.13 (L) 4.22 - 5.81 MIL/uL   Hemoglobin 9.4 (L) 13.0 - 17.0 g/dL    HCT 27.7 (L) 39.0 - 52.0 %   MCV 88.5 78.0 - 100.0 fL   MCH 30.0 26.0 - 34.0 pg   MCHC 33.9 30.0 - 36.0 g/dL   RDW 12.4 11.5 - 15.5 %   Platelets 251 150 - 400 K/uL  Comprehensive metabolic panel     Status: Abnormal   Collection Time: 03/27/15  8:12 AM  Result Value Ref Range   Sodium 133 (L) 135 - 145 mmol/L   Potassium 4.0 3.5 - 5.1 mmol/L   Chloride 97 (L) 101 - 111 mmol/L   CO2 28 22 - 32 mmol/L   Glucose, Bld 105 (H) 65 - 99 mg/dL   BUN 8 6 - 20 mg/dL   Creatinine, Ser 0.64 0.61 - 1.24 mg/dL   Calcium 9.0 8.9 - 10.3 mg/dL   Total Protein 7.4 6.5 - 8.1 g/dL   Albumin 2.5 (L) 3.5 - 5.0 g/dL   AST 37 15 - 41 U/L   ALT 35 17 - 63 U/L   Alkaline Phosphatase 80 38 - 126 U/L   Total Bilirubin 0.8 0.3 - 1.2 mg/dL   GFR calc non Af Amer >  60 >60 mL/min   GFR calc Af Amer >60 >60 mL/min    Comment: (NOTE) The eGFR has been calculated using the CKD EPI equation. This calculation has not been validated in all clinical situations. eGFR's persistently <60 mL/min signify possible Chronic Kidney Disease.    Anion gap 8 5 - 15     Lipid Panel     Component Value Date/Time   CHOL  01/24/2010 0502    170        ATP III CLASSIFICATION:  <200     mg/dL   Desirable  200-239  mg/dL   Borderline High  >=240    mg/dL   High          TRIG 104 01/24/2010 0502   HDL 90 01/24/2010 0502   CHOLHDL 1.9 01/24/2010 0502   VLDL 21 01/24/2010 0502   LDLCALC  01/24/2010 0502    59        Total Cholesterol/HDL:CHD Risk Coronary Heart Disease Risk Table                     Men   Women  1/2 Average Risk   3.4   3.3  Average Risk       5.0   4.4  2 X Average Risk   9.6   7.1  3 X Average Risk  23.4   11.0        Use the calculated Patient Ratio above and the CHD Risk Table to determine the patient's CHD Risk.        ATP III CLASSIFICATION (LDL):  <100     mg/dL   Optimal  100-129  mg/dL   Near or Above                    Optimal  130-159  mg/dL   Borderline  160-189  mg/dL   High   >190     mg/dL   Very High     Lab Results  Component Value Date   HGBA1C 5.3 03/22/2015     Lab Results  Component Value Date   [facility]  01/24/2010    59        Total Cholesterol/HDL:CHD Risk Coronary Heart Disease Risk Table                     Men   Women  1/2 Average Risk   3.4   3.3  Average Risk       5.0   4.4  2 X Average Risk   9.6   7.1  3 X Average Risk  23.4   11.0        Use the calculated Patient Ratio above and the CHD Risk Table to determine the patient's CHD Risk.        ATP III CLASSIFICATION (LDL):  <100     mg/dL   Optimal  100-129  mg/dL   Near or Above                    Optimal  130-159  mg/dL   Borderline  160-189  mg/dL   High  >190     mg/dL   Very High   CREATININE 0.64 03/27/2015       HISTORY OF PRESENT ILLNESS 50 yr old presented with fever and body aches starting earlier this week with a cough & sneezing. He endorsed increased dyspnea and fatigue as well. Starting 12/31 he noted  fever and chills. He endorsed a cough productive of bright yellow phlegm. At the time he was not having any diarrhea or emesis. On arrival the patient was found to have a rectal temperature of 103.1F and hypoxia on room air to 87%. His lactic acid was elevated at 6.7. CT 03/22/15 right middle lobe and bibasilar consolidation with upper lobe ground-glass and airspace opacities, most consistent with pneumonia.   Assessment and plan  Acute Hypoxic Respiratory Failure Bilateral Pneumonia due to strep pneumo, blood culture positive for gram-positive cocci which resulted 03/23/15, previous blood culture showed staph coag negative Respiratory Panel+ for Adenovirus Supplement oxygen to maintain saturation >92%, on 3L at rest , may qualify for home o2 CT chest as reported above  Received  iv abx for 6 days , repeat CXR Showed B/L PNA and possible fluid overload. Echocardiogram shows normal systolic function. I think the findings are all related to his pneumonia. Patient  was given a dose of Lasix. Continue to monitor for now. No evidence for congestive heart failure at this time. WBC slightly improved this morning. Repeat blood cultures obtained yesterday . Positive blood cultures obtained initially, likely contaminant, staph coag negative . Repeat blood culture 1/3 reports ngsf mrsa negative,dc'd  vanc vq scan negative for  PE, CXR shows persistent infiltrates ,  Switched to levaquin for another 7 days Recommend CXR in 4 weeks   Hyponatremia Sodium 133, improving , SIADH?   Tachycardia suspect from PNA / hypoxia  H/O HTN - Not currently on any medications. Seems to be stable. Monitor patient on telemetry Prn hydralazine. Mildly elevated troponin, likely due to demand ischemia. No wall motion abnormalities noted on echocardiogram.   Acute Renal Failure Lactic Acidosis - Improving. Hypomagesium  improving AKI 1.97>0.64 Replete k and mg  Received Lasix x1   Mild Anemia  Mild Thrombocytopenia  Stable blood counts.  Sepsis lactate 6.7 on admission, with bacteremia, gram positive cocci Bilateral Pneumonia> +strep pneuomia urine  1/1 PCT >175  Continuing Levaquin for 7 days Sputum culture no growth so far Urine Legionella pending , streptococcal antigen positive Respiratory viral panel PCR positive for adenovirus  & influenza PCR negative  Droplet isolation   Hyperglycemia - No h/o DM. Cont SSI   H/O Heavy EtOH Use - No h/o withdrawal but has never stopped. 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 CRP elevated at 15, ESR only 26 P: total C3 & C4 levels within normal limits. Will need to establish care as an outpatient rheumatology. Oxycodone for pain control PT eval  recommended rolling walker Prednisone 16m/day for 5days for anklepain,could be flareup of his lupus   Acute on chronic diastolic HF received one dose  of lasix, cont lasix prn, lasix PO 287mday provided    Discharge Exam:    Blood pressure 143/85, pulse 91, temperature 99.7 F (37.6 C), temperature source Oral, resp. rate 19, height '6\' 2"'  (1.88 m), weight 84.641 kg (186 lb 9.6 oz), SpO2 92 %.  General: NAD, following commands  Cardiovascular: S1, S2 is normal, regular No edema. No appreciable JVD.  Pulmonary: Speaking in complete sentences. Crackles heard at bases. No wheezing Abdomen: Soft. Normal bowel sounds. Nondistended. Grossly nontender. Musculoskeletal: Normal bulk and tone. Hand grip strength 5/5 bilaterally. No joint deformity or effusion appreciated. Neurological: no focal deficits noted . Moving all 4 extremities equally.  SignedReyne Dumas 03/27/2015, 11:06 AM        Time spent >45 mins

## 2015-03-27 NOTE — Evaluation (Signed)
Occupational Therapy Evaluation Patient Details Name: Jim Gordon MRN: [id number] DOB: 12-Dec-1965 Today's Date: 03/27/2015    History of Present Illness Patient is a 50 y/o male presents with fever, body aches, chills and SOB. In ED, pt found to be febrile with hypoxia on room air of 87%, lactic acid elevation at 6.7. CT- B/l PNA with possible fluid overload. PMH positive for HTN and discoid lupus.   Clinical Impression   Pt currently supervision level to min guard for most selfcare tasks.  Feel he will be able to reach modified independence with repeated performance of selfcare tasks.  Dyspnea still 3/4 with selfcare tasks and O2 sats decreasing to 90% on 3Ls nasal cannula.  Oxygen 97% on 3Ls at rest.  Pt will benefit from acute care OT to address endurance and balance deficits.  Feel he will need tub shower seat and BSC for home.  No post acute follow-up recommended.     Follow Up Recommendations  No OT follow up;Supervision - Intermittent    Equipment Recommendations  Tub/shower seat;3 in 1 bedside comode       Precautions / Restrictions Precautions Precautions: Fall Precaution Comments: Keep Sp02 >92%. Restrictions Weight Bearing Restrictions: No      Mobility Bed Mobility                  Transfers Overall transfer level: Needs assistance Equipment used: None Transfers: Sit to/from Stand;Stand Pivot Transfers Sit to Stand: Supervision Stand pivot transfers: Supervision       General transfer comment: Pt with one LOBto the right when turning to get up from the toilet, but able to correct on his own.     Balance     Sitting balance-Leahy Scale: Normal       Standing balance-Leahy Scale: Fair                              ADL Overall ADL's : Needs assistance/impaired Eating/Feeding: Independent;Sitting   Grooming: Wash/dry hands;Wash/dry face;Supervision/safety   Upper Body Bathing: Set up;Sitting   Lower Body Bathing: Supervison/  safety;Sit to/from stand   Upper Body Dressing : Supervision/safety;Sitting   Lower Body Dressing: Supervision/safety;Sit to/from stand   Toilet Transfer: Min guard   Toileting- Architect and Hygiene: Supervision/safety;Sit to/from stand       Functional mobility during ADLs: Min guard General ADL Comments: Pt with limited endurance for standing and selfcare tasks.  Oxygen sats jdecreasing to 90%on 3Ls nasal cannula.  Increased pain in his back when reaching down to his feet but he jwas able to complete with increased time.       Vision Vision Assessment?: No apparent visual deficits   Perception Perception Perception Tested?: No   Praxis Praxis Praxis tested?: Within functional limits    Pertinent Vitals/Pain Pain Assessment: Faces Faces Pain Scale: Hurts little more Pain Location: abdominal,back,shoulders,generalized pain Pain Intervention(s): Limited activity within patient's tolerance;Monitored during session        Extremity/Trunk Assessment Upper Extremity Assessment Upper Extremity Assessment: Generalized weakness (shoulder strengtth 3+/5 all other joints 4/5)   Lower Extremity Assessment Lower Extremity Assessment: Defer to PT evaluation   Cervical / Trunk Assessment Cervical / Trunk Assessment: Normal   Communication Communication Communication: No difficulties   Cognition Arousal/Alertness: Awake/alert Behavior During Therapy: WFL for tasks assessed/performed Overall Cognitive Status: Within Functional Limits for tasks assessed  Home Living Family/patient expects to be discharged to:: Private residence Living Arrangements: Children Available Help at Discharge: Family;Available PRN/intermittently (daughter works) Type of Home: Mobile home Home Access: Stairs to enter Secretary/administratorntrance Stairs-Number of Steps: 3 Entrance Stairs-Rails: Right Home Layout: One level               Home Equipment: None           Prior Functioning/Environment Level of Independence: Independent        Comments: Works - "a little bit of everything."    OT Diagnosis: Generalized weakness   OT Problem List: Decreased strength;Impaired balance (sitting and/or standing);Cardiopulmonary status limiting activity   OT Treatment/Interventions: Self-care/ADL training;Energy conservation;Therapeutic exercise;Neuromuscular education;DME and/or AE instruction;Therapeutic activities;Patient/family education;Balance training    OT Goals(Current goals can be found in the care plan section) Acute Rehab OT Goals Patient Stated Goal: To get back to doing for himself OT Goal Formulation: With patient Time For Goal Achievement: 04/03/15 Potential to Achieve Goals: Good  OT Frequency: Min 2X/week              End of Session Equipment Utilized During Treatment: Oxygen Nurse Communication: Mobility status  Activity Tolerance: Patient tolerated treatment well Patient left: in chair;with call bell/phone within reach   Time: 1011-1059 OT Time Calculation (min): 48 min Charges:  OT General Charges $OT Visit: 1 Procedure OT Evaluation $OT Eval Moderate Complexity: 1 Procedure OT Treatments $Self Care/Home Management : 23-37 mins  Indica Marcott OTR/L 03/27/2015, 11:02 AM

## 2015-03-27 NOTE — Progress Notes (Addendum)
Room air sat check is 97%, ambulated patient in hall on room air and he did not desat. Sats stayed 95-98 %, case manager aware. AVS and rx to client who verbalizes understanding, will dc after lunch. dc'd at 1419 to private car home with all personal belongings.

## 2015-03-27 NOTE — Care Management Note (Signed)
Case Management Note  Patient Details  Name: Ulla PotashLarry L Romer MRN: [id number] Date of Birth: 05/23/1965  Subjective/Objective:                    Action/Plan:  Patient ambulated on room air oxygen sats 97%, does not qualify for home oxygen . Discussed home health RN / Respir , patient decline home health at present  Expected Discharge Date:  03/25/15               Expected Discharge Plan:  Home/Self Care  In-House Referral:     Discharge planning Services  CM Consult  Post Acute Care Choice:  Home Health, Durable Medical Equipment Choice offered to:     DME Arranged:    DME Agency:     HH Arranged:  Patient Refused HH Agency:     Status of Service:  Completed, signed off  Medicare Important Message Given:    Date Medicare IM Given:    Medicare IM give by:    Date Additional Medicare IM Given:    Additional Medicare Important Message give by:     If discussed at Long Length of Stay Meetings, dates discussed:    Additional Comments:  Kingsley PlanWile, Elai Vanwyk Marie, RN 03/27/2015, 11:44 AM

## 2015-03-29 LAB — CULTURE, BLOOD (ROUTINE X 2)
CULTURE: NO GROWTH
Culture: NO GROWTH

## 2015-06-12 ENCOUNTER — Encounter (HOSPITAL_COMMUNITY): Payer: Self-pay | Admitting: Emergency Medicine

## 2015-06-12 ENCOUNTER — Emergency Department (HOSPITAL_COMMUNITY)
Admission: EM | Admit: 2015-06-12 | Discharge: 2015-06-12 | Disposition: A | Discharge status: Home / Self Care | Payer: Medicaid Other | Attending: Emergency Medicine | Admitting: Emergency Medicine

## 2015-06-12 ENCOUNTER — Emergency Department (HOSPITAL_COMMUNITY): Payer: Medicaid Other

## 2015-06-12 DIAGNOSIS — F17210 Nicotine dependence, cigarettes, uncomplicated: Secondary | ICD-10-CM | POA: Insufficient documentation

## 2015-06-12 DIAGNOSIS — I10 Essential (primary) hypertension: Secondary | ICD-10-CM | POA: Insufficient documentation

## 2015-06-12 DIAGNOSIS — M79641 Pain in right hand: Secondary | ICD-10-CM | POA: Diagnosis present

## 2015-06-12 DIAGNOSIS — L089 Local infection of the skin and subcutaneous tissue, unspecified: Secondary | ICD-10-CM | POA: Diagnosis not present

## 2015-06-12 MED ORDER — CEPHALEXIN 500 MG PO CAPS: 500.0000 mg | ORAL_CAPSULE | Freq: Four times a day (QID) | ORAL | Status: DC

## 2015-06-12 MED ORDER — HYDROCODONE-ACETAMINOPHEN 5-325 MG PO TABS: 1.0000 | ORAL_TABLET | ORAL | Status: DC | PRN

## 2015-06-12 MED ORDER — SULFAMETHOXAZOLE-TRIMETHOPRIM 800-160 MG PO TABS
1.0000 | ORAL_TABLET | Freq: Once | ORAL | Status: AC
  Administered 2015-06-12: 1 via ORAL
  Filled 2015-06-12: qty 1

## 2015-06-12 MED ORDER — LIDOCAINE HCL (PF) 2 % IJ SOLN
INTRAMUSCULAR | Status: AC
  Filled 2015-06-12: qty 10

## 2015-06-12 MED ORDER — CEPHALEXIN 500 MG PO CAPS
500.0000 mg | ORAL_CAPSULE | Freq: Once | ORAL | Status: AC
  Administered 2015-06-12: 500 mg via ORAL
  Filled 2015-06-12: qty 1

## 2015-06-12 MED ORDER — SULFAMETHOXAZOLE-TRIMETHOPRIM 800-160 MG PO TABS: 1.0000 | ORAL_TABLET | Freq: Two times a day (BID) | ORAL | Status: AC

## 2015-06-12 NOTE — ED Notes (Signed)
PT states he was helping cutting down trees x4 days ago and had an injury to right thumb nail and had a splinter in right thump. PT c/o pain and swelling to right thumb x4 days with no fever.

## 2015-06-12 NOTE — ED Provider Notes (Signed)
CSN: [id number]     Arrival date & time 06/12/15  1124 History   First MD Initiated Contact with Patient 06/12/15 1346     Chief Complaint  Patient presents with  . Hand Pain     (Consider location/radiation/quality/duration/timing/severity/associated sxs/prior Treatment) The history is provided by the patient.   Jim Gordon is a 50 y.o.  right handed male presenting with increased pain and swelling of his right thumb over the past 4-5 days.  He describes pulling a broken nail edge but also having to remove a wood splinter from his distal thumb along his nail edge while doing tree work.  He endorses constant throbbing pain without relief.  He has found no alleviators. His tetanus is utd.    Past Medical History  Diagnosis Date  . Hypertension   . Discoid lupus    Past Surgical History  Procedure Laterality Date  . Elbow surgery      left   Family History  Problem Relation Age of Onset  . Diabetes      Father's Side  . Arthritis Mother    Social History  Substance Use Topics  . Smoking status: Current Every Day Smoker -- 1.00 packs/day for 20 years    Types: Cigarettes  . Smokeless tobacco: Never Used  . Alcohol Use: 30.0 oz/week    50 Cans of beer per week     Comment: 4-5 beers a day - No h/o withdrawal but has never stopped drinking either.    Review of Systems  Constitutional: Negative for fever and chills.  Respiratory: Negative for shortness of breath and wheezing.   Musculoskeletal: Positive for arthralgias.  Skin: Positive for color change and wound.  Neurological: Negative for numbness.      Allergies  Bee venom and Penicillins  Home Medications   Prior to Admission medications   Medication Sig Start Date End Date Taking? Authorizing Provider  acetaminophen (TYLENOL) 500 MG tablet Take 1 tablet (500 mg total) by mouth every 6 (six) hours as needed for mild pain, fever or headache. 03/27/15   Richarda Overlie, MD  albuterol (PROVENTIL HFA;VENTOLIN HFA)  108 (90 Base) MCG/ACT inhaler Inhale 2 puffs into the lungs every 6 (six) hours as needed for wheezing or shortness of breath. 03/27/15   Richarda Overlie, MD  cephALEXin (KEFLEX) 500 MG capsule Take 1 capsule (500 mg total) by mouth 4 (four) times daily. 06/12/15   Burgess Amor, PA-C  folic acid (FOLVITE) 1 MG tablet Take 1 tablet (1 mg total) by mouth daily. 03/27/15   Richarda Overlie, MD  furosemide (LASIX) 20 MG tablet Take 1 tablet (20 mg total) by mouth daily. 03/27/15   Richarda Overlie, MD  HYDROcodone-acetaminophen (NORCO/VICODIN) 5-325 MG tablet Take 1 tablet by mouth every 4 (four) hours as needed. 06/12/15   Burgess Amor, PA-C  levofloxacin (LEVAQUIN) 750 MG tablet Take 1 tablet (750 mg total) by mouth daily. 03/27/15   Richarda Overlie, MD  oxyCODONE (OXY IR/ROXICODONE) 5 MG immediate release tablet Take 1 tablet (5 mg total) by mouth every 4 (four) hours as needed for moderate pain. 03/27/15   Richarda Overlie, MD  predniSONE (DELTASONE) 20 MG tablet Take 1 tablet (20 mg total) by mouth daily with breakfast. 03/28/15   Richarda Overlie, MD  sulfamethoxazole-trimethoprim (BACTRIM DS,SEPTRA DS) 800-160 MG tablet Take 1 tablet by mouth 2 (two) times daily. 06/12/15 06/19/15  Burgess Amor, PA-C  thiamine 100 MG tablet Take 1 tablet (100 mg total) by mouth daily. 03/27/15  Richarda OverlieNayana Abrol, MD   BP 160/95 mmHg  Pulse 86  Temp(Src) 97.9 F (36.6 C)  Resp 20  Ht 6\' 1"  (1.854 m)  Wt 87.544 kg  BMI 25.47 kg/m2  SpO2 99% Physical Exam  Constitutional: He appears well-developed and well-nourished.  HENT:  Head: Atraumatic.  Neck: Normal range of motion.  Cardiovascular:  Pulses equal bilaterally  Musculoskeletal: He exhibits edema and tenderness.  Right distal thumb edematous, firm on the volar finger pad with tenderness.  Pain worsened with attempts to flex the dip.  Nail bed appears pale, no obvious puncture site noted.  No visible foreign body.  No paronychia visible.  Neurological: He is alert. He has normal strength. He displays  normal reflexes. No sensory deficit.  Skin: Skin is warm and dry.  Psychiatric: He has a normal mood and affect.    ED Course  .Marland Kitchen.Incision and Drainage Date/Time: 06/12/2015 2:55 PM Performed by: Burgess AmorIDOL, Bron Snellings Authorized by: Burgess AmorIDOL, Samai Corea Consent: Verbal consent obtained. Risks and benefits: risks, benefits and alternatives were discussed Consent given by: patient Patient identity confirmed: verbally with patient Time out: Immediately prior to procedure a "time out" was called to verify the correct patient, procedure, equipment, support staff and site/side marked as required. Indications for incision and drainage: felon. Body area: upper extremity Location details: right thumb Anesthesia: digital block Local anesthetic: lidocaine 2% without epinephrine Anesthetic total: 3 ml Scalpel size: 11 Incision type: single straight Incision depth: dermal (incision made from lateral edge of distal finger through the distal pad.  No purulence obtained.) Complexity: simple Comments: Dr. Patria Maneampos asked to see pt.  He completed the felon I & D including exploration with hemostat.  No purulence obtained.  During procedure, purulence started to drain from beneath the distal nail.  It was opened further, copious pus obtained, culture sent.   (including critical care time) Labs Review Labs Reviewed  CULTURE, ROUTINE-ABSCESS    Imaging Review Dg Finger Thumb Right  06/12/2015  CLINICAL DATA:  Splinters while doing tree work with right thumb pain and swelling EXAM: RIGHT THUMB 2+V COMPARISON:  None. FINDINGS: There is no evidence of fracture or dislocation. There is no evidence of arthropathy or other focal bone abnormality. Soft tissues are unremarkable IMPRESSION: No acute abnormality noted. Electronically Signed   By: Alcide CleverMark  Lukens M.D.   On: 06/12/2015 12:22   I have personally reviewed and evaluated these images and lab results as part of my medical decision-making.   EKG Interpretation None       MDM   Final diagnoses:  Finger infection    Pt advised warm soapy water soaks bid, keflex and bactrim prescribed, hydrocodone prescribed.  Pt to return here in 2 days for a recheck of his infection.  He understands and agrees with plan.    Burgess AmorJulie Saquoia Sianez, PA-C 06/12/15 1646  Azalia BilisKevin Campos, MD 06/13/15 [id number]

## 2015-06-12 NOTE — ED Notes (Signed)
Pt with significant right thumb swelling after pulling a hang nail and removing some splinters from it several days ago

## 2015-06-12 NOTE — Discharge Instructions (Signed)
Fingertip Infection When an infection is around the nail, it is called a paronychia. When it appears over the tip of the finger, it is called a felon. These infections are due to minor injuries or cracks in the skin. If they are not treated properly, they can lead to bone infection and permanent damage to the fingernail. Incision and drainage is necessary if a pus pocket (an abscess) has formed. Antibiotics and pain medicine may also be needed. Keep your hand elevated for the next 2-3 days to reduce swelling and pain. If a pack was placed in the abscess, it should be removed in 1-2 days by your caregiver. Soak the finger in warm water for 20 minutes 4 times daily to help promote drainage. Keep the hands as dry as possible. Wear protective gloves with cotton liners. See your caregiver for follow-up care as recommended.  HOME CARE INSTRUCTIONS   Keep wound clean, dry and dressed as suggested by your caregiver.  Soak in warm salt water for fifteen minutes, four times per day for bacterial infections.  Your caregiver will prescribe an antibiotic if a bacterial infection is suspected. Take antibiotics as directed and finish the prescription, even if the problem appears to be improving before the medicine is gone.  Only take over-the-counter or prescription medicines for pain, discomfort, or fever as directed by your caregiver. SEEK IMMEDIATE MEDICAL CARE IF:  There is redness, swelling, or increasing pain in the wound.  Pus or any other unusual drainage is coming from the wound.  An unexplained oral temperature above 102 F (38.9 C) develops.  You notice a foul smell coming from the wound or dressing. MAKE SURE YOU:   Understand these instructions.  Monitor your condition.  Contact your caregiver if you are getting worse or not improving.   This information is not intended to replace advice given to you by your health care provider. Make sure you discuss any questions you have with your  health care provider.   Document Released: 04/14/2004 Document Revised: 05/30/2011 Document Reviewed: 08/25/2014 Elsevier Interactive Patient Education [employer]2016 [employer]    As discussed, wash and soak your thumb in  warm soapy water twice daily.  Keep covered and clean.  Return here in 2 days for a recheck.  Start taking your antibiotics as prescribed today.

## 2015-06-12 NOTE — ED Notes (Signed)
Pt verbalized understanding of no driving and to use caution within 4 hours of taking pain meds due to meds cause drowsiness.  Instructed pt to take all of antibiotics as prescribed. 

## 2015-06-15 LAB — CULTURE, ROUTINE-ABSCESS
Culture: NORMAL
SPECIAL REQUESTS: NORMAL

## 2016-12-06 ENCOUNTER — Encounter (HOSPITAL_COMMUNITY): Payer: Self-pay | Admitting: *Deleted

## 2016-12-06 ENCOUNTER — Emergency Department (HOSPITAL_COMMUNITY)
Admission: EM | Admit: 2016-12-06 | Discharge: 2016-12-06 | Disposition: A | Discharge status: Home / Self Care | Payer: Medicaid Other | Attending: Emergency Medicine | Admitting: Emergency Medicine

## 2016-12-06 DIAGNOSIS — I10 Essential (primary) hypertension: Secondary | ICD-10-CM | POA: Insufficient documentation

## 2016-12-06 DIAGNOSIS — F10920 Alcohol use, unspecified with intoxication, uncomplicated: Secondary | ICD-10-CM | POA: Insufficient documentation

## 2016-12-06 DIAGNOSIS — R7401 Elevation of levels of liver transaminase levels: Secondary | ICD-10-CM

## 2016-12-06 DIAGNOSIS — F17210 Nicotine dependence, cigarettes, uncomplicated: Secondary | ICD-10-CM | POA: Insufficient documentation

## 2016-12-06 DIAGNOSIS — I471 Supraventricular tachycardia: Secondary | ICD-10-CM

## 2016-12-06 DIAGNOSIS — R740 Nonspecific elevation of levels of transaminase and lactic acid dehydrogenase [LDH]: Secondary | ICD-10-CM | POA: Insufficient documentation

## 2016-12-06 DIAGNOSIS — R002 Palpitations: Secondary | ICD-10-CM | POA: Diagnosis present

## 2016-12-06 LAB — COMPREHENSIVE METABOLIC PANEL
ALK PHOS: 54 U/L (ref 38–126)
ALT: 42 U/L (ref 17–63)
AST: 55 U/L — AB (ref 15–41)
Albumin: 4 g/dL (ref 3.5–5.0)
Anion gap: 13 (ref 5–15)
BILIRUBIN TOTAL: 0.4 mg/dL (ref 0.3–1.2)
BUN: 7 mg/dL (ref 6–20)
CALCIUM: 8.4 mg/dL — AB (ref 8.9–10.3)
CHLORIDE: 102 mmol/L (ref 101–111)
CO2: 21 mmol/L — ABNORMAL LOW (ref 22–32)
CREATININE: 0.71 mg/dL (ref 0.61–1.24)
Glucose, Bld: 83 mg/dL (ref 65–99)
Potassium: 3.7 mmol/L (ref 3.5–5.1)
Sodium: 136 mmol/L (ref 135–145)
Total Protein: 8.1 g/dL (ref 6.5–8.1)

## 2016-12-06 LAB — CBC WITH DIFFERENTIAL/PLATELET
BASOS ABS: 0 10*3/uL (ref 0.0–0.1)
Basophils Relative: 1 %
EOS ABS: 0.1 10*3/uL (ref 0.0–0.7)
Eosinophils Relative: 2 %
HEMATOCRIT: 41.2 % (ref 39.0–52.0)
Hemoglobin: 15 g/dL (ref 13.0–17.0)
Lymphocytes Relative: 59 %
Lymphs Abs: 3.8 10*3/uL (ref 0.7–4.0)
MCH: 31.9 pg (ref 26.0–34.0)
MCHC: 36.4 g/dL — ABNORMAL HIGH (ref 30.0–36.0)
MCV: 87.7 fL (ref 78.0–100.0)
MONO ABS: 0.4 10*3/uL (ref 0.1–1.0)
Monocytes Relative: 6 %
NEUTROS ABS: 2.1 10*3/uL (ref 1.7–7.7)
Neutrophils Relative %: 32 %
PLATELETS: 164 10*3/uL (ref 150–400)
RBC: 4.7 MIL/uL (ref 4.22–5.81)
RDW: 13.7 % (ref 11.5–15.5)
WBC: 6.5 10*3/uL (ref 4.0–10.5)

## 2016-12-06 LAB — ETHANOL: ALCOHOL ETHYL (B): 285 mg/dL — AB (ref <5)

## 2016-12-06 MED ORDER — ADENOSINE 6 MG/2ML IV SOLN
6.0000 mg | Freq: Once | INTRAVENOUS | Status: AC
  Administered 2016-12-06: 6 mg via INTRAVENOUS

## 2016-12-06 MED ORDER — ADENOSINE 6 MG/2ML IV SOLN
INTRAVENOUS | Status: AC
  Filled 2016-12-06: qty 4

## 2016-12-06 NOTE — Discharge Instructions (Signed)
Do not drink or smoke - either of those can affect your heart.

## 2016-12-06 NOTE — ED Provider Notes (Signed)
[facility]EMER[facility]ENCY DEPT Provider Note   CSN: [id number] Arrival date & time: 12/06/16  0329     History   Chief Complaint Chief Complaint  Patient presents with  . Palpitations    HPI Jim Gordon is a 51 y.o. male.  The history is provided by the patient.  He states that he had been drinking, when he suddenly had onset of his heart racing. He denies chest pain, heaviness, tightness, pressure. He denies any dyspnea. This is never happened before. He has a history of discoid lupus, but denies prior problems with palpitations. He denies any drug use other than ethanol and cigarettes. Nothing made his symptoms better, nothing made them worse.  Past Medical History:  Diagnosis Date  . Discoid lupus   . Hypertension     Patient Active Problem List   Diagnosis Date Noted  . Pneumonia 03/22/2015  . Sepsis (HCC) 03/21/2015    Past Surgical History:  Procedure Laterality Date  . ELBOW SURGERY     left       Home Medications    Prior to Admission medications   Medication Sig Start Date End Date Taking? Authorizing Provider  acetaminophen (TYLENOL) 500 MG tablet Take 1 tablet (500 mg total) by mouth every 6 (six) hours as needed for mild pain, fever or headache. 03/27/15   Richarda Overlie, MD    Family History Family History  Problem Relation Age of Onset  . Diabetes Unknown        Father's Side  . Arthritis Mother     Social History Social History  Substance Use Topics  . Smoking status: Current Every Day Smoker    Packs/day: 1.00    Years: 20.00    Types: Cigarettes  . Smokeless tobacco: Never Used  . Alcohol use 30.0 oz/week    50 Cans of beer per week     Comment: 4-5 beers a day - No h/o withdrawal but has never stopped drinking either.     Allergies   Bee venom and Penicillins   Review of Systems Review of Systems  All other systems reviewed and are negative.    Physical Exam Updated Vital Signs BP (!) 132/93 (BP Location: Right Arm)   Pulse  (!) 190   Temp 97.9 F (36.6 C) (Oral)   Resp 20   Ht 6' 1.5" (1.867 m)   Wt 87.5 kg (193 lb)   SpO2 95%   BMI 25.12 kg/m   Physical Exam  Nursing note and vitals reviewed.  51 year old male, resting comfortably and in no acute distress. Vital signs are significant for tachycardia and hypertension. Oxygen saturation is 95%, which is normal. Head is normocephalic and atraumatic. PERRLA, EOMI. Oropharynx is clear. Neck is nontender and supple without adenopathy or JVD. Back is nontender and there is no CVA tenderness. Lungs are clear without rales, wheezes, or rhonchi. Chest is nontender. Heart is tachycardic without murmur. Abdomen is soft, flat, nontender without masses or hepatosplenomegaly and peristalsis is normoactive. Extremities have no cyanosis or edema, full range of motion is present. Skin is warm and dry. Areas of vitiligo is seen on the face. Neurologic: Mental status is normal, cranial nerves are intact, there are no motor or sensory deficits.  ED Treatments / Results  Labs (all labs ordered are listed, but only abnormal results are displayed) Labs Reviewed  COMPREHENSIVE METABOLIC PANEL - Abnormal; Notable for the following:       Result Value   CO2 21 (*)  Calcium 8.4 (*)    AST 55 (*)    All other components within normal limits  ETHANOL - Abnormal; Notable for the following:    Alcohol, Ethyl (B) 285 (*)    All other components within normal limits  CBC WITH DIFFERENTIAL/PLATELET - Abnormal; Notable for the following:    MCHC 36.4 (*)    All other components within normal limits  RAPID URINE DRUG SCREEN, HOSP PERFORMED    EKG  EKG Interpretation  Date/Time:  Tuesday December 06 2016 03:41:30 EDT Ventricular Rate:  190 PR Interval:    QRS Duration: 88 QT Interval:  248 QTC Calculation: 441 R Axis:   76 Text Interpretation:  Supraventricular tachycardia ST & T wave abnormality, consider inferior ischemia Abnormal ECG When compared with ECG of  03/21/2015, Supraventricular tachycardia has replaced Sinus rhythm ST-t abnormality is now present - probably rate-related Confirmed by Dione Booze ([id number]) on 12/06/2016 4:05:47 AM       EKG Interpretation  Date/Time:  Tuesday December 06 2016 03:59:13 EDT Ventricular Rate:  109 PR Interval:    QRS Duration: 95 QT Interval:  332 QTC Calculation: 447 R Axis:   82 Text Interpretation:  Sinus tachycardia Probable left atrial enlargement Otherwise within normal limits When compared with ECG of EARLIER SAME DATE Sinus tachycardia has replaced Supraventricular tachycardia ST-t abnormality is no longer present - apparently was rate-related Confirmed by Dione Booze ([id number]) on 12/06/2016 4:07:39 AM      Procedures .Cardioversion Date/Time: 12/06/2016 4:08 AM Performed by: Dione Booze Authorized by: Preston Fleeting, Niyonna Betsill   Consent:    Consent obtained:  Verbal and emergent situation   Consent given by:  Patient   Risks discussed:  Induced arrhythmia   Alternatives discussed:  Alternative treatment (electrical cardioversion) Pre-procedure details:    Cardioversion basis:  Emergent   Rhythm:  Supraventricular tachycardia Post-procedure details:    Patient status:  Awake   Patient tolerance of procedure:  Tolerated well, no immediate complications Comments:     Cardioversion successfully completed with adenosine 6 mg intravenously.   (including critical care time) CRITICAL CARE Performed by: UJWJX,BJYNW Total critical care time: 35 minutes Critical care time was exclusive of separately billable procedures and treating other patients. Critical care was necessary to treat or prevent imminent or life-threatening deterioration. Critical care was time spent personally by me on the following activities: development of treatment plan with patient and/or surrogate as well as nursing, discussions with consultants, evaluation of patient's response to treatment, examination of patient, obtaining history  from patient or surrogate, ordering and performing treatments and interventions, ordering and review of laboratory studies, ordering and review of radiographic studies, pulse oximetry and re-evaluation of patient's condition.  Medications Ordered in ED Medications  adenosine (ADENOCARD) 6 MG/2ML injection 6 mg (6 mg Intravenous Given 12/06/16 0402)     Initial Impression / Assessment and Plan / ED Course  I have reviewed the triage vital signs and the nursing notes.  Pertinent lab results that were available during my care of the patient were reviewed by me and considered in my medical decision making (see chart for details).  Supraventricular tachycardia. Old records are reviewed, and he has no relevant past visits. Carotid sinus massage was attempted without any change. He was given adenosine 6 mg with successful conversion to sinus rhythm. Screening labs are obtained.  Rhythm has remained sinus. Alcohol level is come back significantly elevated at 285. Mild elevation of AST is present, presumably from alcohol abuse. Patient is discharged with  instructions to discontinue use of both tobacco products and ethanol.  Final Clinical Impressions(s) / ED Diagnoses   Final diagnoses:  SVT (supraventricular tachycardia) (HCC)  Alcohol intoxication, uncomplicated (HCC)  Elevated AST (SGOT)    New Prescriptions New Prescriptions   No medications on file     Dione Booze, MD 12/06/16 0710

## 2016-12-06 NOTE — ED Triage Notes (Signed)
Pt c/o palpitations that started x 1 hour ago

## 2016-12-06 NOTE — ED Notes (Signed)
Informed pt urine sample is needed. Pt stated he is unable to void at this time. Gave pt urinal to attempt when able.

## 2017-05-07 ENCOUNTER — Other Ambulatory Visit: Payer: Self-pay

## 2017-05-07 ENCOUNTER — Emergency Department (HOSPITAL_COMMUNITY): Payer: Medicaid Other

## 2017-05-07 ENCOUNTER — Emergency Department (HOSPITAL_COMMUNITY)
Admission: EM | Admit: 2017-05-07 | Discharge: 2017-05-07 | Disposition: A | Discharge status: Home / Self Care | Payer: Medicaid Other | Attending: Emergency Medicine | Admitting: Emergency Medicine

## 2017-05-07 ENCOUNTER — Encounter (HOSPITAL_COMMUNITY): Payer: Self-pay | Admitting: Emergency Medicine

## 2017-05-07 DIAGNOSIS — I471 Supraventricular tachycardia: Secondary | ICD-10-CM | POA: Diagnosis not present

## 2017-05-07 DIAGNOSIS — I10 Essential (primary) hypertension: Secondary | ICD-10-CM | POA: Diagnosis not present

## 2017-05-07 DIAGNOSIS — F17210 Nicotine dependence, cigarettes, uncomplicated: Secondary | ICD-10-CM | POA: Insufficient documentation

## 2017-05-07 DIAGNOSIS — R000 Tachycardia, unspecified: Secondary | ICD-10-CM | POA: Diagnosis present

## 2017-05-07 LAB — BASIC METABOLIC PANEL
Anion gap: 11 (ref 5–15)
BUN: 12 mg/dL (ref 6–20)
CO2: 21 mmol/L — ABNORMAL LOW (ref 22–32)
Calcium: 8.8 mg/dL — ABNORMAL LOW (ref 8.9–10.3)
Chloride: 106 mmol/L (ref 101–111)
Creatinine, Ser: 0.99 mg/dL (ref 0.61–1.24)
GFR calc Af Amer: >60 mL/min (ref >60)
GFR calc non Af Amer: >60 mL/min (ref >60)
Glucose, Bld: 122 mg/dL — ABNORMAL HIGH (ref 65–99)
Potassium: 3.6 mmol/L (ref 3.5–5.1)
Sodium: 138 mmol/L (ref 135–145)

## 2017-05-07 LAB — CBC
HCT: 38.6 % — ABNORMAL LOW (ref 39.0–52.0)
Hemoglobin: 12.7 g/dL — ABNORMAL LOW (ref 13.0–17.0)
MCH: 30 pg (ref 26.0–34.0)
MCHC: 32.9 g/dL (ref 30.0–36.0)
MCV: 91.3 fL (ref 78.0–100.0)
Platelets: 183 10*3/uL (ref 150–400)
RBC: 4.23 MIL/uL (ref 4.22–5.81)
RDW: 13.2 % (ref 11.5–15.5)
WBC: 4.3 10*3/uL (ref 4.0–10.5)

## 2017-05-07 MED ORDER — ADENOSINE 6 MG/2ML IV SOLN: 12.0000 mg | Freq: Once | INTRAVENOUS | Status: DC

## 2017-05-07 MED ORDER — ADENOSINE 6 MG/2ML IV SOLN
INTRAVENOUS | Status: AC
  Filled 2017-05-07: qty 8

## 2017-05-07 MED ORDER — ADENOSINE 6 MG/2ML IV SOLN
6.0000 mg | Freq: Once | INTRAVENOUS | Status: AC
  Administered 2017-05-07: 6 mg via INTRAVENOUS

## 2017-05-07 MED ORDER — ADENOSINE 6 MG/2ML IV SOLN
12.0000 mg | Freq: Once | INTRAVENOUS | Status: AC
  Administered 2017-05-07: 12 mg via INTRAVENOUS

## 2017-05-07 NOTE — ED Triage Notes (Signed)
Pt c/o sudden onset of heart palpitations that began around 1900 this evening. Pt reports non-radiating LT sided CP and dizziness. HR 195 in triage.

## 2017-05-09 NOTE — ED Provider Notes (Signed)
[facility] EMERGENCY DEPARTMENT Provider Note   CSN: [id number] Arrival date & time: 05/07/17  2128     History   Chief Complaint Chief Complaint  Patient presents with  . Palpitations    HPI Ulla PotashLarry L Kimmey is a 52 y.o. male.  HPI   51yM with tachycardia.  Onset about an hour prior to arrival to the hospital.  He reports he was just sitting on the couch watching TV when symptoms started.  Says he feels a little bit uncomfortable but not really having any pain.  Mild dyspnea.  He was in his usual state of health earlier today.  He denies any drug use.  He reports an episode of similar type symptoms previously.  No intervention prior to arrival.  Past Medical History:  Diagnosis Date  . Discoid lupus   . Hypertension     Patient Active Problem List   Diagnosis Date Noted  . Pneumonia 03/22/2015  . Sepsis (HCC) 03/21/2015    Past Surgical History:  Procedure Laterality Date  . ELBOW SURGERY     left       Home Medications    Prior to Admission medications   Not on File    Family History Family History  Problem Relation Age of Onset  . Diabetes Unknown        Father's Side  . Arthritis Mother     Social History Social History   Tobacco Use  . Smoking status: Current Every Day Smoker    Packs/day: 1.00    Years: 20.00    Pack years: 20.00    Types: Cigarettes  . Smokeless tobacco: Never Used  Substance Use Topics  . Alcohol use: Yes    Alcohol/week: 30.0 oz    Types: 50 Cans of beer per week    Comment: daily, drank 2 40s of [employer] today  . Drug use: No     Allergies   Bee venom and Penicillins   Review of Systems Review of Systems  All systems reviewed and negative, other than as noted in HPI.  Physical Exam Updated Vital Signs BP 102/64   Pulse 81   Temp 98.3 F (36.8 C) (Oral)   Resp 17   Ht 6' 1.5" (1.867 m)   Wt 83 kg (183 lb)   SpO2 97%   BMI 23.82 kg/m   Physical Exam  Constitutional: He appears  well-developed and well-nourished. No distress.  HENT:  Head: Normocephalic and atraumatic.  Eyes: Conjunctivae are normal. Right eye exhibits no discharge. Left eye exhibits no discharge.  Neck: Neck supple.  Cardiovascular: Regular rhythm and normal heart sounds. Exam reveals no gallop and no friction rub.  No murmur heard. Marked tachcyardia  Pulmonary/Chest: Effort normal and breath sounds normal. No respiratory distress.  Abdominal: Soft. He exhibits no distension. There is no tenderness.  Musculoskeletal: He exhibits no edema or tenderness.  Neurological: He is alert.  Skin: Skin is warm and dry.  Psychiatric: He has a normal mood and affect. His behavior is normal. Thought content normal.  Nursing note and vitals reviewed.    ED Treatments / Results  Labs (all labs ordered are listed, but only abnormal results are displayed) Labs Reviewed  BASIC METABOLIC PANEL - Abnormal; Notable for the following components:      Result Value   CO2 21 (*)    Glucose, Bld 122 (*)    Calcium 8.8 (*)    All other components within normal limits  CBC -  Abnormal; Notable for the following components:   Hemoglobin 12.7 (*)    HCT 38.6 (*)    All other components within normal limits    EKG  EKG Interpretation  Date/Time:  Sunday May 07 2017 22:05:39 EST Ventricular Rate:  98 PR Interval:    QRS Duration: 91 QT Interval:  311 QTC Calculation: 397 R Axis:   80 Text Interpretation:  Sinus rhythm Borderline prolonged PR interval Left atrial enlargement Nonspecific T abnormalities, lateral leads No significant change was found Confirmed by Azalia Bilis ([id number]) on 05/08/2017 11:15:44 PM       Radiology Dg Chest 2 View  Result Date: 05/07/2017 CLINICAL DATA:  Chest pain and palpitation EXAM: CHEST  2 VIEW COMPARISON:  03/26/2015 FINDINGS: No acute pulmonary infiltrate or effusion. Cardiomediastinal silhouette within normal limits. No pneumothorax is seen. IMPRESSION: No active  cardiopulmonary disease. Electronically Signed   By: Jasmine Pang M.D.   On: 05/07/2017 23:11    Procedures .Cardioversion Date/Time: 05/07/2017 10:00 PM Performed by: Raeford Razor, MD Authorized by: Raeford Razor, MD   Consent:    Consent obtained:  Verbal   Consent given by:  Patient Pre-procedure details:    Cardioversion basis:  Emergent   Rhythm:  Supraventricular tachycardia Patient sedated: No Attempt one:    Shock outcome:  Conversion to normal sinus rhythm Post-procedure details:    Patient status:  Awake   Patient tolerance of procedure:  Tolerated well, no immediate complications Comments:     Adenosine 6mg  x1 and then 12mg  x1 with conversion to NSR.      (including critical care time)    Medications Ordered in ED Medications  adenosine (ADENOCARD) 6 MG/2ML injection 12 mg (12 mg Intravenous Given 05/07/17 2154)  adenosine (ADENOCARD) 6 MG/2ML injection 6 mg (6 mg Intravenous Given 05/07/17 2153)     Initial Impression / Assessment and Plan / ED Course  I have reviewed the triage vital signs and the nursing notes.  Pertinent labs & imaging results that were available during my care of the patient were reviewed by me and considered in my medical decision making (see chart for details).     SVT. Converted with adenosine. Reports hx of the same. Now no symptoms while in sinus rhythm. Electrolytes ok. Cardiology FU.   Final Clinical Impressions(s) / ED Diagnoses   Final diagnoses:  SVT (supraventricular tachycardia) [facility])    ED Discharge Orders    None       Raeford Razor, MD 05/09/17 2002

## 2017-10-10 ENCOUNTER — Emergency Department (HOSPITAL_COMMUNITY): Payer: Medicaid Other

## 2017-10-10 ENCOUNTER — Observation Stay (HOSPITAL_COMMUNITY)
Admission: EM | Admit: 2017-10-10 | Discharge: 2017-10-11 | Disposition: A | Discharge status: Home / Self Care | Payer: Medicaid Other | Attending: Internal Medicine | Admitting: Internal Medicine

## 2017-10-10 ENCOUNTER — Other Ambulatory Visit: Payer: Self-pay

## 2017-10-10 ENCOUNTER — Encounter (HOSPITAL_COMMUNITY): Payer: Self-pay | Admitting: Emergency Medicine

## 2017-10-10 DIAGNOSIS — F1010 Alcohol abuse, uncomplicated: Secondary | ICD-10-CM

## 2017-10-10 DIAGNOSIS — I5032 Chronic diastolic (congestive) heart failure: Secondary | ICD-10-CM | POA: Diagnosis not present

## 2017-10-10 DIAGNOSIS — H01129 Discoid lupus erythematosus of unspecified eye, unspecified eyelid: Secondary | ICD-10-CM | POA: Diagnosis not present

## 2017-10-10 DIAGNOSIS — R0789 Other chest pain: Secondary | ICD-10-CM | POA: Diagnosis present

## 2017-10-10 DIAGNOSIS — F17210 Nicotine dependence, cigarettes, uncomplicated: Secondary | ICD-10-CM | POA: Insufficient documentation

## 2017-10-10 DIAGNOSIS — D649 Anemia, unspecified: Secondary | ICD-10-CM | POA: Diagnosis not present

## 2017-10-10 DIAGNOSIS — L930 Discoid lupus erythematosus: Secondary | ICD-10-CM

## 2017-10-10 DIAGNOSIS — R079 Chest pain, unspecified: Secondary | ICD-10-CM

## 2017-10-10 DIAGNOSIS — I110 Hypertensive heart disease with heart failure: Secondary | ICD-10-CM | POA: Diagnosis not present

## 2017-10-10 DIAGNOSIS — R778 Other specified abnormalities of plasma proteins: Secondary | ICD-10-CM

## 2017-10-10 DIAGNOSIS — I10 Essential (primary) hypertension: Secondary | ICD-10-CM

## 2017-10-10 DIAGNOSIS — Z720 Tobacco use: Secondary | ICD-10-CM

## 2017-10-10 DIAGNOSIS — R7989 Other specified abnormal findings of blood chemistry: Secondary | ICD-10-CM

## 2017-10-10 LAB — BASIC METABOLIC PANEL
ANION GAP: 11 (ref 5–15)
BUN: 11 mg/dL (ref 6–20)
CHLORIDE: 102 mmol/L (ref 98–111)
CO2: 23 mmol/L (ref 22–32)
CREATININE: 0.87 mg/dL (ref 0.61–1.24)
Calcium: 9.4 mg/dL (ref 8.9–10.3)
GFR calc non Af Amer: >60 mL/min (ref >60)
Glucose, Bld: 91 mg/dL (ref 70–99)
POTASSIUM: 3.7 mmol/L (ref 3.5–5.1)
Sodium: 136 mmol/L (ref 135–145)

## 2017-10-10 LAB — CBC
HEMATOCRIT: 36.7 % — AB (ref 39.0–52.0)
HEMOGLOBIN: 12.5 g/dL — AB (ref 13.0–17.0)
MCH: 30.6 pg (ref 26.0–34.0)
MCHC: 34.1 g/dL (ref 30.0–36.0)
MCV: 90 fL (ref 78.0–100.0)
PLATELETS: 185 10*3/uL (ref 150–400)
RBC: 4.08 MIL/uL — AB (ref 4.22–5.81)
RDW: 12.9 % (ref 11.5–15.5)
WBC: 4.1 10*3/uL (ref 4.0–10.5)

## 2017-10-10 LAB — TROPONIN I: Troponin I: 0.04 ng/mL (ref <0.03)

## 2017-10-10 MED ORDER — ASPIRIN 81 MG PO CHEW
324.0000 mg | CHEWABLE_TABLET | Freq: Once | ORAL | Status: AC
  Administered 2017-10-10: 324 mg via ORAL
  Filled 2017-10-10: qty 4

## 2017-10-10 NOTE — ED Notes (Signed)
Patient transported to X-ray 

## 2017-10-10 NOTE — ED Notes (Signed)
Critical result Trop 0.04. Dr. Preston FleetingGlick notified.

## 2017-10-10 NOTE — ED Triage Notes (Signed)
Pt c/o left chest pressure after eating dinner tonight.

## 2017-10-10 NOTE — ED Provider Notes (Signed)
[facility] EMERGENCY DEPARTMENT Provider Note   CSN: [id number] Arrival date & time: 10/10/17  2154     History   Chief Complaint Chief Complaint  Patient presents with  . Chest Pain    HPI Jim Gordon is a 52 y.o. male.  The history is provided by the patient.  He has history of hypertension and discoid lupus and comes in because of chest discomfort.  At about 9 PM, he had a right temporal headache and noted a sense like his heart was swollen in his chest.  He rated that discomfort at 9/10.  There is no associated dyspnea or nausea but there was slight diaphoresis.  Nothing made it better, nothing made it worse.  There is no radiation of discomfort.  Symptoms have now completely resolved.  He has never had anything like this before.  He does smoke 1/2 to 1 pack of cigarettes a day.  There is no history of diabetes or hyperlipidemia and no family history of premature coronary atherosclerosis.  Past Medical History:  Diagnosis Date  . Discoid lupus   . Hypertension     Patient Active Problem List   Diagnosis Date Noted  . Pneumonia 03/22/2015  . Sepsis (HCC) 03/21/2015    Past Surgical History:  Procedure Laterality Date  . ELBOW SURGERY     left        Home Medications    Prior to Admission medications   Not on File    Family History Family History  Problem Relation Age of Onset  . Diabetes Unknown        Father's Side  . Arthritis Mother     Social History Social History   Tobacco Use  . Smoking status: Current Every Day Smoker    Packs/day: 1.00    Years: 20.00    Pack years: 20.00    Types: Cigarettes  . Smokeless tobacco: Never Used  Substance Use Topics  . Alcohol use: Yes    Alcohol/week: 30.0 oz    Types: 50 Cans of beer per week    Comment: daily, drank 2 40s of [employer] today  . Drug use: No     Allergies   Bee venom and Penicillins   Review of Systems Review of Systems  All other systems reviewed and are  negative.    Physical Exam Updated Vital Signs BP (!) 162/99   Pulse 65   Temp 97.7 F (36.5 C) (Oral)   Resp 17   Ht 6' 1.5" (1.867 m)   Wt 83.9 kg (185 lb)   SpO2 97%   BMI 24.08 kg/m   Physical Exam  Nursing note and vitals reviewed.  52 year old male, resting comfortably and in no acute distress. Vital signs are significant for elevated blood pressure. Oxygen saturation is 97%, which is normal. Head is normocephalic and atraumatic. PERRLA, EOMI. Oropharynx is clear. Neck is nontender and supple without adenopathy or JVD. Back is nontender and there is no CVA tenderness. Lungs are clear without rales, wheezes, or rhonchi. Chest is nontender. Heart has regular rate and rhythm without murmur. Abdomen is soft, flat, nontender without masses or hepatosplenomegaly and peristalsis is normoactive. Extremities have no cyanosis or edema, full range of motion is present. Skin is warm and dry.  Lesions of discoid lupus present on the face. Neurologic: Mental status is normal, cranial nerves are intact, there are no motor or sensory deficits.  ED Treatments / Results  Labs (all labs ordered are  listed, but only abnormal results are displayed) Labs Reviewed  CBC - Abnormal; Notable for the following components:      Result Value   RBC 4.08 (*)    Hemoglobin 12.5 (*)    HCT 36.7 (*)    All other components within normal limits  TROPONIN I - Abnormal; Notable for the following components:   Troponin I 0.04 (*)    All other components within normal limits  BASIC METABOLIC PANEL    EKG EKG Interpretation  Date/Time:  Tuesday October 10 2017 22:00:51 EDT Ventricular Rate:  67 PR Interval:  178 QRS Duration: 92 QT Interval:  430 QTC Calculation: 454 R Axis:   65 Text Interpretation:  Normal sinus rhythm Minimal voltage criteria for LVH, may be normal variant Borderline ECG No significant change since last tracing Confirmed by Jacalyn LefevreHaviland, Julie [phone]([id number]) on 10/10/2017 10:39:05  PM   Radiology Dg Chest 2 View  Result Date: 10/10/2017 CLINICAL DATA:  Left-sided chest pain and shortness of breath for 2 hours. History of lupus, hypertension, current smoker. EXAM: CHEST - 2 VIEW COMPARISON:  05/07/2017 FINDINGS: Normal heart size and pulmonary vascularity. No focal airspace disease or consolidation in the lungs. No blunting of costophrenic angles. No pneumothorax. Mediastinal contours appear intact. Degenerative changes in the spine and shoulders. IMPRESSION: No active cardiopulmonary disease. Electronically Signed   By: Burman NievesWilliam  Stevens M.D.   On: 10/10/2017 22:31    Procedures Procedures   Medications Ordered in ED Medications  aspirin chewable tablet 324 mg (has no administration in time range)     Initial Impression / Assessment and Plan / ED Course  I have reviewed the triage vital signs and the nursing notes.  Pertinent labs & imaging results that were available during my care of the patient were reviewed by me and considered in my medical decision making (see chart for details).  Chest discomfort.  ECG shows no acute changes, but troponin has come back borderline elevated.  It is not clear if this is a true positive or not.  Heart score is 3, which still puts him at low risk for major adverse cardiac events.  Chest x-ray is unremarkable.  Labs also show mild anemia which is unchanged from baseline.  Old records are reviewed, and he has 2 visits for paroxysmal supraventricular tachycardia, but no prior ED visits for chest discomfort.  He is given aspirin and will be admitted for serial troponins and consideration for cardiology consultation.  Case is discussed with Dr. Sherryll BurgerShah, of [facility], who agrees to admit the patient.  Final Clinical Impressions(s) / ED Diagnoses   Final diagnoses:  Chest discomfort  Elevated troponin I level  Normochromic normocytic anemia    ED Discharge Orders    None       Dione BoozeGlick, Lerline Valdivia, MD 10/10/17 2309

## 2017-10-11 ENCOUNTER — Observation Stay (HOSPITAL_BASED_OUTPATIENT_CLINIC_OR_DEPARTMENT_OTHER): Payer: Medicaid Other

## 2017-10-11 ENCOUNTER — Encounter: Payer: Self-pay | Admitting: Cardiology

## 2017-10-11 DIAGNOSIS — R0789 Other chest pain: Secondary | ICD-10-CM | POA: Diagnosis not present

## 2017-10-11 DIAGNOSIS — Z720 Tobacco use: Secondary | ICD-10-CM

## 2017-10-11 DIAGNOSIS — I5032 Chronic diastolic (congestive) heart failure: Secondary | ICD-10-CM

## 2017-10-11 DIAGNOSIS — R079 Chest pain, unspecified: Secondary | ICD-10-CM

## 2017-10-11 DIAGNOSIS — I10 Essential (primary) hypertension: Secondary | ICD-10-CM | POA: Diagnosis not present

## 2017-10-11 DIAGNOSIS — F1010 Alcohol abuse, uncomplicated: Secondary | ICD-10-CM

## 2017-10-11 DIAGNOSIS — L930 Discoid lupus erythematosus: Secondary | ICD-10-CM

## 2017-10-11 LAB — ECHOCARDIOGRAM COMPLETE
Height: 73.5 in
Weight: 2960 oz

## 2017-10-11 LAB — ETHANOL: Alcohol, Ethyl (B): 121 mg/dL — ABNORMAL HIGH (ref <10)

## 2017-10-11 LAB — TROPONIN I
TROPONIN I: 0.04 ng/mL — AB (ref <0.03)
TROPONIN I: 0.04 ng/mL — AB (ref <0.03)
Troponin I: 0.04 ng/mL (ref <0.03)

## 2017-10-11 MED ORDER — ACETAMINOPHEN 650 MG RE SUPP: 650.0000 mg | Freq: Four times a day (QID) | RECTAL | Status: DC | PRN

## 2017-10-11 MED ORDER — ASPIRIN EC 81 MG PO TBEC
81.0000 mg | DELAYED_RELEASE_TABLET | Freq: Every day | ORAL | Status: DC
  Administered 2017-10-11: 81 mg via ORAL
  Filled 2017-10-11: qty 1

## 2017-10-11 MED ORDER — ONDANSETRON HCL 4 MG/2ML IJ SOLN: 4.0000 mg | Freq: Four times a day (QID) | INTRAMUSCULAR | Status: DC | PRN

## 2017-10-11 MED ORDER — ASPIRIN 81 MG PO TBEC: 81.0000 mg | DELAYED_RELEASE_TABLET | Freq: Every day | ORAL | Status: DC

## 2017-10-11 MED ORDER — SODIUM CHLORIDE 0.9 % IV SOLN: 250.0000 mL | INTRAVENOUS | Status: DC | PRN

## 2017-10-11 MED ORDER — ACETAMINOPHEN 325 MG PO TABS
650.0000 mg | ORAL_TABLET | Freq: Four times a day (QID) | ORAL | Status: DC | PRN
Start: 2017-10-11 — End: 2017-10-11

## 2017-10-11 MED ORDER — METOPROLOL TARTRATE 25 MG PO TABS: 25.0000 mg | ORAL_TABLET | Freq: Two times a day (BID) | ORAL | 11 refills | Status: DC

## 2017-10-11 MED ORDER — HYDRALAZINE HCL 20 MG/ML IJ SOLN
10.0000 mg | INTRAMUSCULAR | Status: DC | PRN
  Administered 2017-10-11: 10 mg via INTRAVENOUS
  Filled 2017-10-11: qty 1

## 2017-10-11 MED ORDER — NICOTINE 7 MG/24HR TD PT24
7.0000 mg | MEDICATED_PATCH | Freq: Every day | TRANSDERMAL | Status: DC
  Administered 2017-10-11: 7 mg via TRANSDERMAL
  Filled 2017-10-11: qty 1

## 2017-10-11 MED ORDER — SODIUM CHLORIDE 0.9% FLUSH: 3.0000 mL | INTRAVENOUS | Status: DC | PRN

## 2017-10-11 MED ORDER — ONDANSETRON HCL 4 MG PO TABS: 4.0000 mg | ORAL_TABLET | Freq: Four times a day (QID) | ORAL | Status: DC | PRN

## 2017-10-11 MED ORDER — SODIUM CHLORIDE 0.9% FLUSH
3.0000 mL | Freq: Two times a day (BID) | INTRAVENOUS | Status: DC
  Administered 2017-10-11 (×2): 3 mL via INTRAVENOUS

## 2017-10-11 MED ORDER — ENOXAPARIN SODIUM 40 MG/0.4ML ~~LOC~~ SOLN: 40.0000 mg | SUBCUTANEOUS | Status: DC

## 2017-10-11 NOTE — H&P (Addendum)
History and Physical    Jim Gordon ZOX:[id number] DOB: [birth year] DOA: 10/10/2017  PCP: Patient, No Pcp Per   Patient coming from: Home  Chief Complaint: Chest pain  HPI: Jim Gordon is a 52 y.o. male with medical history significant for hypertension, alcohol abuse, tobacco abuse, chronic diastolic heart failure grade 1 seen on echo 03/2015, discoid lupus who presented to the emergency department with some left-sided chest discomfort that began at approximately 9 PM that was not associated with any activity.  He initially rated his pain level 9/10, but now it appears that his chest pain has spontaneously resolved.  He denies any aggravating or alleviating factors and denies any dyspnea, nausea, diaphoresis, lower extremity edema, orthopnea, or paroxysmal nocturnal dyspnea.  He states he does not take any medications at home nor does he follow up with any physicians.  He apparently drinks 5-6 beers a day and did have some this morning and smokes 1/2 to 1 pack of cigarettes a day.   ED Course: Vital signs stable with elevated blood pressure readings noted.  He is otherwise currently comfortable with no chest pain.  Laboratory data with hemoglobin 12.5 and troponin 0.04.  Two-view chest x-ray with no acute abnormalities and EKG with normal sinus rhythm at 67 bpm with some noted LVH.  He has been given a full dose aspirin.  Review of Systems: All others reviewed and otherwise negative.  Past Medical History:  Diagnosis Date  . Discoid lupus   . Hypertension     Past Surgical History:  Procedure Laterality Date  . ELBOW SURGERY     left     reports that he has been smoking cigarettes.  He has a 20.00 pack-year smoking history. He has never used smokeless tobacco. He reports that he drinks about 30.0 oz of alcohol per week. He reports that he does not use drugs.  Allergies  Allergen Reactions  . Bee Venom Swelling  . Penicillins Hives    Family History  Problem Relation Age of  Onset  . Diabetes Unknown        Father's Side  . Arthritis Mother     Prior to Admission medications   Not on File    Physical Exam: Vitals:   10/10/17 2330 10/10/17 2345 10/11/17 0000 10/11/17 0015  BP:   (!) 165/105   Pulse: 66 71 66 65  Resp: 13 19    Temp:      TempSrc:      SpO2: 96% 100% 94% 97%  Weight:      Height:        Constitutional: NAD, calm, comfortable Vitals:   10/10/17 2330 10/10/17 2345 10/11/17 0000 10/11/17 0015  BP:   (!) 165/105   Pulse: 66 71 66 65  Resp: 13 19    Temp:      TempSrc:      SpO2: 96% 100% 94% 97%  Weight:      Height:       Eyes: lids and conjunctivae normal ENMT: Mucous membranes are moist.  Neck: normal, supple Respiratory: clear to auscultation bilaterally. Normal respiratory effort. No accessory muscle use.  Cardiovascular: Regular rate and rhythm, no murmurs. No extremity edema. Abdomen: no tenderness, no distention. Bowel sounds positive.  Musculoskeletal:  No joint deformity upper and lower extremities.   Skin: Discoid lupus lesions noted on face Psychiatric: Normal judgment and insight. Alert and oriented x 3. Normal mood.   Labs on Admission: I have personally reviewed following labs  and imaging studies  CBC: Recent Labs  Lab 10/10/17 2203  WBC 4.1  HGB 12.5*  HCT 36.7*  MCV 90.0  PLT 185   Basic Metabolic Panel: Recent Labs  Lab 10/10/17 2203  NA 136  K 3.7  CL 102  CO2 23  GLUCOSE 91  BUN 11  CREATININE 0.87  CALCIUM 9.4   GFR: Estimated Creatinine Clearance: 112.6 mL/min (by C-G formula based on SCr of 0.87 mg/dL). Liver Function Tests: No results for input(s): AST, ALT, ALKPHOS, BILITOT, PROT, ALBUMIN in the last 168 hours. No results for input(s): LIPASE, AMYLASE in the last 168 hours. No results for input(s): AMMONIA in the last 168 hours. Coagulation Profile: No results for input(s): INR, PROTIME in the last 168 hours. Cardiac Enzymes: Recent Labs  Lab 10/10/17 2203  TROPONINI  0.04*   BNP (last 3 results) No results for input(s): PROBNP in the last 8760 hours. HbA1C: No results for input(s): HGBA1C in the last 72 hours. CBG: No results for input(s): GLUCAP in the last 168 hours. Lipid Profile: No results for input(s): CHOL, HDL, LDLCALC, TRIG, CHOLHDL, LDLDIRECT in the last 72 hours. Thyroid Function Tests: No results for input(s): TSH, T4TOTAL, FREET4, T3FREE, THYROIDAB in the last 72 hours. Anemia Panel: No results for input(s): VITAMINB12, FOLATE, FERRITIN, TIBC, IRON, RETICCTPCT in the last 72 hours. Urine analysis:    Component Value Date/Time   COLORURINE AMBER (A) 03/21/2015 2025   APPEARANCEUR CLEAR 03/21/2015 2025   LABSPEC >1.030 (H) 03/21/2015 2025   PHURINE 5.0 03/21/2015 2025   GLUCOSEU NEGATIVE 03/21/2015 2025   HGBUR NEGATIVE 03/21/2015 2025   BILIRUBINUR NEGATIVE 03/21/2015 2025   KETONESUR TRACE (A) 03/21/2015 2025   PROTEINUR 30 (A) 03/21/2015 2025   UROBILINOGEN 0.2 06/09/2012 0955   NITRITE NEGATIVE 03/21/2015 2025   LEUKOCYTESUR NEGATIVE 03/21/2015 2025    Radiological Exams on Admission: Dg Chest 2 View  Result Date: 10/10/2017 CLINICAL DATA:  Left-sided chest pain and shortness of breath for 2 hours. History of lupus, hypertension, current smoker. EXAM: CHEST - 2 VIEW COMPARISON:  05/07/2017 FINDINGS: Normal heart size and pulmonary vascularity. No focal airspace disease or consolidation in the lungs. No blunting of costophrenic angles. No pneumothorax. Mediastinal contours appear intact. Degenerative changes in the spine and shoulders. IMPRESSION: No active cardiopulmonary disease. Electronically Signed   By: Burman NievesWilliam  Stevens M.D.   On: 10/10/2017 22:31    EKG: Independently reviewed. NSR at 67 bpm with LVH.  Assessment/Plan Principal Problem:   Chest pain Active Problems:   Essential hypertension   Discoid lupus   Tobacco abuse   Alcohol abuse   Chronic diastolic CHF (congestive heart failure) (HCC)    1. Atypical  chest pain.  Heart score 3 with no significant EKG changes and troponin currently borderline.  We will continue to trend with repeat EKG in a.m. and monitor on telemetry.  2D echocardiogram.  Consider outpatient stress test on discharge if ACS ruled out.  Maintain on full dose aspirin. 2. Hypertension-poorly controlled.  Hydralazine pushes as needed.  No chest pain currently noted. 3. Anemia.  Appears stable can be worked up outpatient. 4. Alcohol abuse.  Will check serum alcohol level in place on CIWA monitoring.  Last drink was noted to be this morning. 5. Tobacco abuse.  Cessation counseling with nicotine patch. 6. Chronic diastolic heart failure.  Currently appears stable.  Will recheck 2D echocardiogram as noted above.   DVT prophylaxis: Lovenox Code Status: Full Family Communication: None at bedside Disposition  Plan:ACS rule out with likely DC in AM Consults called:None Admission status: Obs, tele   Eviana Sibilia Hoover Brunette DO [facility] Pager [phone]  If 7PM-7AM, please contact night-coverage [email or web address] Password [facility]  10/11/2017, 12:40 AM

## 2017-10-11 NOTE — Discharge Summary (Signed)
Physician Discharge Summary  Jim PotashLarry L Gordon ZOX:[id number] DOB: 05/10/1965 DOA: 10/10/2017  PCP: Patient, No Pcp Per  Admit date: 10/10/2017 Discharge date: 10/11/2017  Time spent: 45 minutes independent of time spent for smoking cessation counseling  Recommendations for Outpatient Follow-up:  -Will be discharged home today. -We have scheduled appointment with cardiology for 7/25 for consideration of OP stress testing.   Discharge Diagnoses:  Principal Problem:   Chest pain Active Problems:   Essential hypertension   Discoid lupus   Tobacco abuse   Alcohol abuse   Chronic diastolic CHF (congestive heart failure) (HCC)   Discharge Condition: Stable and improved  Filed Weights   10/10/17 2205  Weight: 83.9 kg (185 lb)    History of present illness:  As per Dr. Sherryll BurgerShah on 7/24: Jim Gordon is a 52 y.o. male with medical history significant for hypertension, alcohol abuse, tobacco abuse, chronic diastolic heart failure grade 1 seen on echo 03/2015, discoid lupus who presented to the emergency department with some left-sided chest discomfort that began at approximately 9 PM that was not associated with any activity.  He initially rated his pain level 9/10, but now it appears that his chest pain has spontaneously resolved.  He denies any aggravating or alleviating factors and denies any dyspnea, nausea, diaphoresis, lower extremity edema, orthopnea, or paroxysmal nocturnal dyspnea.  He states he does not take any medications at home nor does he follow up with any physicians.  He apparently drinks 5-6 beers a day and did have some this morning and smokes 1/2 to 1 pack of cigarettes a day.   ED Course: Vital signs stable with elevated blood pressure readings noted.  He is otherwise currently comfortable with no chest pain.  Laboratory data with hemoglobin 12.5 and troponin 0.04.  Two-view chest x-ray with no acute abnormalities and EKG with normal sinus rhythm at 67 bpm with some noted  LVH.  He has been given a full dose aspirin.    Hospital Course:   Atypical chest pain -Chest pain resolved. -2D echo: Ejection fraction of 60 to 65%, normal wall motion, indeterminate diastolic function, systolic pressure could not be accurately estimated, no pericardial effusion.  -Patient does appear to have some minimal ST elevations in the inferior leads.  I discussed these findings with Dr. Diona BrownerMcDowell, he reviewed EKG, he states that reciprocal ST depression in 1 and aVL was absent which means that it is unlikely acute ischemia.  We will schedule patient to be seen as an outpatient for further work-up including consideration of stress testing. -Aspirin 81 mg daily.  Hypertension -Not well controlled, not taking any medications outside the hospital. -Will be started on metoprolol tartrate 25 mg twice daily.  Tobacco abuse -I have discussed tobacco cessation with the patient.  I have counseled the patient regarding the negative impacts of continued tobacco use including but not limited to lung cancer, COPD, and cardiovascular disease.  I have discussed alternatives to tobacco and modalities that may help facilitate tobacco cessation including but not limited to biofeedback, hypnosis, and medications.  Total time spent with tobacco counseling was 4 minutes.  Alcohol abuse -No signs of withdrawal hospitalized, advised to take multivitamin including thiamine and folate.   Procedures:  Echo as above   Consultations:  Curbside with cardiology  Discharge Instructions  Discharge Instructions    Diet - low sodium heart healthy   Complete by:  As directed    Increase activity slowly   Complete by:  As directed  Allergies as of 10/11/2017      Reactions   Bee Venom Swelling   Penicillins Hives      Medication List    TAKE these medications   aspirin 81 MG EC tablet Take 1 tablet (81 mg total) by mouth daily. Start taking on:  10/12/2017   metoprolol tartrate 25 MG  tablet Commonly known as:  LOPRESSOR Take 1 tablet (25 mg total) by mouth 2 (two) times daily.      Allergies  Allergen Reactions  . Bee Venom Swelling  . Penicillins Hives   Follow-up Information    Jonelle Sidle, MD Follow up on 10/12/2017.   Specialty:  Cardiology Why:  At 1:20 pm Contact information: [address] [place] [address] [phone]            The results of significant diagnostics from this hospitalization (including imaging, microbiology, ancillary and laboratory) are listed below for reference.    Significant Diagnostic Studies: Dg Chest 2 View  Result Date: 10/10/2017 CLINICAL DATA:  Left-sided chest pain and shortness of breath for 2 hours. History of lupus, hypertension, current smoker. EXAM: CHEST - 2 VIEW COMPARISON:  05/07/2017 FINDINGS: Normal heart size and pulmonary vascularity. No focal airspace disease or consolidation in the lungs. No blunting of costophrenic angles. No pneumothorax. Mediastinal contours appear intact. Degenerative changes in the spine and shoulders. IMPRESSION: No active cardiopulmonary disease. Electronically Signed   By: Burman Nieves M.D.   On: 10/10/2017 22:31    Microbiology: No results found for this or any previous visit (from the past 240 hour(s)).   Labs: Basic Metabolic Panel: Recent Labs  Lab 10/10/17 2203  NA 136  K 3.7  CL 102  CO2 23  GLUCOSE 91  BUN 11  CREATININE 0.87  CALCIUM 9.4   Liver Function Tests: No results for input(s): AST, ALT, ALKPHOS, BILITOT, PROT, ALBUMIN in the last 168 hours. No results for input(s): LIPASE, AMYLASE in the last 168 hours. No results for input(s): AMMONIA in the last 168 hours. CBC: Recent Labs  Lab 10/10/17 2203  WBC 4.1  HGB 12.5*  HCT 36.7*  MCV 90.0  PLT 185   Cardiac Enzymes: Recent Labs  Lab 10/10/17 2203 10/11/17 0141 10/11/17 0702 10/11/17 1318  TROPONINI 0.04* 0.04* 0.04* 0.04*   BNP: BNP (last 3 results) No results for  input(s): BNP in the last 8760 hours.  ProBNP (last 3 results) No results for input(s): PROBNP in the last 8760 hours.  CBG: No results for input(s): GLUCAP in the last 168 hours.     Signed:  Chaya Jan  [facility] Pager: [phone] 10/11/2017, 4:49 PM

## 2017-10-11 NOTE — Progress Notes (Signed)
*  PRELIMINARY RESULTS* Echocardiogram 2D Echocardiogram has been performed.  Stacey DrainWhite, Isrrael Fluckiger J 10/11/2017, 12:26 PM

## 2017-10-11 NOTE — Progress Notes (Signed)
Cardiology Office Note  Date: 10/12/2017   ID: Jim Gordon, DOB 06/03/1965, MRN [id number]  PCP: Patient, No Pcp Per  Consulting Cardiologist: Nona DellSamuel Javaughn Opdahl, MD   Chief Complaint  Patient presents with  . History of chest pain    History of Present Illness: Jim Gordon is a 52 y.o. male referred for cardiology consultation by Dr. Ardyth HarpsHernandez after recent hospital observation at [facility] with chest pain.  He was just discharged yesterday.  I reviewed his records and updated the chart. History includes previously documented SVT, I personally reviewed his ECGs from February.  He was converted to sinus rhythm at that time following adenosine.  He does not take any regular medication for this nor does he follow-up with a PCP.  He states that his recent chest discomfort felt like a fullness or swelling, started suddenly when he was sitting on his couch after eating, watching television.  He did not specifically recall his heart rate racing at that point, but has felt palpitations in the past.  At baseline he reports no exertional chest pain, NYHA class I dyspnea, no syncope.  I reviewed his recent ECG as well as echocardiogram.  LVEF is 60 to 65% without regional wall motion abnormalities.  He was placed on both low-dose aspirin and Lopressor 25 mg twice daily.  Today we talked about continuing the Lopressor mainly for treatment of PSVT.  I also talked with him about smoking cessation and significantly reducing his alcohol intake.  He has discoid lupus, has not followed with PCP or a rheumatologist for years.  I talked with him about reestablishing follow-up with a PCP.  Past Medical History:  Diagnosis Date  . Alcohol abuse   . Discoid lupus   . Essential hypertension   . History of pneumonia   . PSVT (paroxysmal supraventricular tachycardia) (HCC)     Past Surgical History:  Procedure Laterality Date  . ELBOW SURGERY Left     Current Outpatient Medications  Medication  Sig Dispense Refill  . metoprolol tartrate (LOPRESSOR) 25 MG tablet Take 1 tablet (25 mg total) by mouth 2 (two) times daily. 60 tablet 11   No current facility-administered medications for this visit.    Allergies:  Bee venom and Penicillins   Social History: The patient  reports that he has been smoking cigarettes.  He started smoking about 31 years ago. He has a 20.00 pack-year smoking history. He has never used smokeless tobacco. He reports that he drinks about 30.0 oz of alcohol per week. He reports that he does not use drugs.   Family History: The patient's family history includes Arthritis in his mother; Diabetes in his unknown relative.   ROS:  Please see the history of present illness. Otherwise, complete review of systems is positive for none.  All other systems are reviewed and negative.   Physical Exam: VS:  BP 110/66 (BP Location: Right Arm, Cuff Size: Large)   Pulse (!) 58   Ht 6' 1.5" (1.867 m)   Wt 185 lb 6.4 oz (84.1 kg)   SpO2 99%   BMI 24.13 kg/m , BMI Body mass index is 24.13 kg/m.  Wt Readings from Last 3 Encounters:  10/12/17 185 lb 6.4 oz (84.1 kg)  10/10/17 185 lb (83.9 kg)  05/07/17 183 lb (83 kg)    General: Well developed male, appears comfortable at rest. HEENT: Conjunctiva and lids normal, oropharynx clear. Neck: Supple, no elevated JVP or carotid bruits, no thyromegaly. Lungs: Clear to  auscultation, nonlabored breathing at rest. Cardiac: Regular rate and rhythm, no S3 or significant systolic murmur, no pericardial rub. Abdomen: Soft, nontender, bowel sounds present. Extremities: No pitting edema, distal pulses 2+. Skin: Warm and dry.  Hypopigmented scars on the face. Musculoskeletal: No kyphosis. Neuropsychiatric: Alert and oriented x3, affect grossly appropriate.  ECG: I personally reviewed the tracing from 10/10/2017 which showed sinus rhythm with nonspecific ST changes.  Recent Labwork: 12/06/2016: ALT 42; AST 55 10/10/2017: BUN 11; Creatinine,  Ser 0.87; Hemoglobin 12.5; Platelets 185; Potassium 3.7; Sodium 136   Other Studies Reviewed Today:  Echocardiogram 10/11/2017: Study Conclusions  - Left ventricle: The cavity size was normal. Wall thickness was   increased in a pattern of moderate LVH. Systolic function was   normal. The estimated ejection fraction was in the range of 60%   to 65%. Wall motion was normal; there were no regional wall   motion abnormalities. Indeterminate diastolic function. - Aortic valve: Trileaflet; mildly thickened leaflets. - Right atrium: Central venous pressure (est): 3 mm Hg. - Atrial septum: No defect or patent foramen ovale was identified. - Tricuspid valve: There was trivial regurgitation. - Pulmonary arteries: Systolic pressure could not be accurately   estimated. - Pericardium, extracardiac: There was no pericardial effusion.  Assessment and Plan:  1.  Recent episode of chest discomfort.  Troponin I levels flat at 0.04, not suggestive of ACS.  ECG also nonspecific.  Echocardiogram demonstrates normal LVEF at 60 to 65% without wall motion normalities.  Patient's symptoms occurred suddenly at rest and resolved without specific intervention.  He does not report exertional chest discomfort or unusual shortness of breath.  I question whether PSVT could have been etiology of symptoms, resolved prior to his evaluation.  He does have clearly documented PSVT based on record review.  At this point would agree with continuing Lopressor 25 mg twice daily.  We will arrange a follow-up visit for clinical reassessment.  2.  Discoid lupus, no regular follow-up at this time.  I encouraged him to at least consider establishing with a PCP.  3.  Alcohol abuse.  We discussed significantly reducing his alcohol intake.  4.  Tobacco abuse.  We discussed smoking cessation.  Current medicines were reviewed with the patient today.  Disposition: Follow up in 6 months.  Signed, Jonelle Sidle, MD,  [facility] 10/12/2017 1:53 PM    [facility] at [facility] [address], [place], [place] [address] Phone: [phone]; Fax: [phone]

## 2017-10-12 ENCOUNTER — Ambulatory Visit (INDEPENDENT_AMBULATORY_CARE_PROVIDER_SITE_OTHER): Payer: Medicaid Other | Admitting: Cardiology

## 2017-10-12 ENCOUNTER — Encounter: Payer: Self-pay | Admitting: Cardiology

## 2017-10-12 VITALS — BP 110/66 | HR 58 | Ht 73.5 in | Wt 185.4 lb

## 2017-10-12 DIAGNOSIS — R0789 Other chest pain: Secondary | ICD-10-CM | POA: Diagnosis not present

## 2017-10-12 DIAGNOSIS — I471 Supraventricular tachycardia: Secondary | ICD-10-CM

## 2017-10-12 DIAGNOSIS — Z720 Tobacco use: Secondary | ICD-10-CM | POA: Diagnosis not present

## 2017-10-12 DIAGNOSIS — F1010 Alcohol abuse, uncomplicated: Secondary | ICD-10-CM

## 2017-10-12 LAB — HIV ANTIBODY (ROUTINE TESTING W REFLEX): HIV Screen 4th Generation wRfx: NONREACTIVE

## 2017-10-12 NOTE — Patient Instructions (Addendum)
Medication Instructions:   Your physician has recommended you make the following change in your medication:   Stop aspirin.  Continue metoprolol tartrate.  Labwork:  NONE  Testing/Procedures:  NONE  Follow-Up:  Your physician recommends that you schedule a follow-up appointment in: 6 months. You will receive a reminder letter in the mail in about 4 months reminding you to call and schedule your appointment. If you don't receive this letter, please contact our office.  Any Other Special Instructions Will Be Listed Below (If Applicable).  If you need a refill on your cardiac medications before your next appointment, please call your pharmacy.

## 2017-11-12 ENCOUNTER — Emergency Department (HOSPITAL_COMMUNITY)
Admission: EM | Admit: 2017-11-12 | Discharge: 2017-11-12 | Disposition: A | Discharge status: Home / Self Care | Payer: Medicaid Other | Attending: Emergency Medicine | Admitting: Emergency Medicine

## 2017-11-12 ENCOUNTER — Emergency Department (HOSPITAL_COMMUNITY): Payer: Medicaid Other

## 2017-11-12 ENCOUNTER — Encounter (HOSPITAL_COMMUNITY): Payer: Self-pay

## 2017-11-12 DIAGNOSIS — Y939 Activity, unspecified: Secondary | ICD-10-CM | POA: Insufficient documentation

## 2017-11-12 DIAGNOSIS — Y998 Other external cause status: Secondary | ICD-10-CM | POA: Diagnosis not present

## 2017-11-12 DIAGNOSIS — S63283A Dislocation of proximal interphalangeal joint of left middle finger, initial encounter: Secondary | ICD-10-CM | POA: Insufficient documentation

## 2017-11-12 DIAGNOSIS — S93401A Sprain of unspecified ligament of right ankle, initial encounter: Secondary | ICD-10-CM | POA: Diagnosis not present

## 2017-11-12 DIAGNOSIS — F17210 Nicotine dependence, cigarettes, uncomplicated: Secondary | ICD-10-CM | POA: Diagnosis not present

## 2017-11-12 DIAGNOSIS — W1789XA Other fall from one level to another, initial encounter: Secondary | ICD-10-CM | POA: Insufficient documentation

## 2017-11-12 DIAGNOSIS — Y92008 Other place in unspecified non-institutional (private) residence as the place of occurrence of the external cause: Secondary | ICD-10-CM | POA: Diagnosis not present

## 2017-11-12 DIAGNOSIS — I10 Essential (primary) hypertension: Secondary | ICD-10-CM | POA: Diagnosis not present

## 2017-11-12 DIAGNOSIS — Z79899 Other long term (current) drug therapy: Secondary | ICD-10-CM | POA: Diagnosis not present

## 2017-11-12 DIAGNOSIS — S6992XA Unspecified injury of left wrist, hand and finger(s), initial encounter: Secondary | ICD-10-CM | POA: Diagnosis present

## 2017-11-12 MED ORDER — LIDOCAINE HCL (PF) 2 % IJ SOLN
10.0000 mL | Freq: Once | INTRAMUSCULAR | Status: AC
  Administered 2017-11-12: 10 mL

## 2017-11-12 MED ORDER — LIDOCAINE HCL (PF) 2 % IJ SOLN
INTRAMUSCULAR | Status: AC
  Filled 2017-11-12: qty 10

## 2017-11-12 NOTE — Discharge Instructions (Addendum)
You have dislocated your finger.  We were able to reduce it back to normal, but you will likely have pain and swelling with this finger.  There may be a very small crack in your finger bone.  Ice, elevate, Tylenol or ibuprofen, finger splint.  Referral to hand surgeon if symptoms persist.  Phone number given.  X-rays of your right foot and ankle were negative.  Wear your ankle brace, ice, elevate.

## 2017-11-12 NOTE — ED Provider Notes (Signed)
[facility] EMERGENCY DEPARTMENT Provider Note   CSN: [id number] Arrival date & time: 11/12/17  [id number]     History   Chief Complaint Chief Complaint  Patient presents with  . Ankle Pain  . Hand Pain    HPI Jim Gordon is a 52 y.o. male.  Level 5 caveat for acuity of condition.  Patient presents with left middle finger pain and right ankle pain after allegedly falling off the porch last night while intoxicated.  No head or neck injury.  No other extremity pain.  Normal behavior now.  Severity of pain is moderate.     Past Medical History:  Diagnosis Date  . Alcohol abuse   . Discoid lupus   . Essential hypertension   . History of pneumonia   . PSVT (paroxysmal supraventricular tachycardia) [facility](HCC)     Patient Active Problem List   Diagnosis Date Noted  . Essential hypertension 10/11/2017  . Chest pain 10/11/2017  . 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 Tobacco abuse 10/11/2017  . Alcohol abuse 10/11/2017  . Chronic diastolic CHF (congestive heart failure) (HCC) 10/11/2017  . Pneumonia 03/22/2015  . Sepsis (HCC) 03/21/2015    Past Surgical History:  Procedure Laterality Date  . ELBOW SURGERY Left         Home Medications    Prior to Admission medications   Medication Sig Start Date End Date Taking? Authorizing Provider  metoprolol tartrate (LOPRESSOR) 25 MG tablet Take 1 tablet (25 mg total) by mouth 2 (two) times daily. 10/11/17 10/11/18 Yes Henderson CloudHernandez Acosta, Estela Y, MD    Family History Family History  Problem Relation Age of Onset  . Diabetes Unknown   . Arthritis Mother     Social History Social History   Tobacco Use  . Smoking status: Current Every Day Smoker    Packs/day: 1.00    Years: 20.00    Pack years: 20.00    Types: Cigarettes    Start date: 09/23/1986  . Smokeless tobacco: Never Used  Substance Use Topics  . Alcohol use: Yes    Alcohol/week: 50.0 standard drinks    Types: 50 Cans of beer per week  . Drug use: No     Allergies     Bee venom and Penicillins   Review of Systems Review of Systems  Unable to perform ROS: Acuity of condition     Physical Exam Updated Vital Signs BP (!) 144/89   Pulse 67   Temp 97.8 F (36.6 C) (Oral)   Resp 16   Ht 6\' 1"  (1.854 m)   Wt 84.8 kg   SpO2 (!) 88%   BMI 24.67 kg/m   Physical Exam  Constitutional: He is oriented to person, place, and time. He appears well-developed and well-nourished.  HENT:  Head: Normocephalic and atraumatic.  Eyes: Conjunctivae are normal.  Neck: Neck supple.  Cardiovascular: Normal rate and regular rhythm.  Pulmonary/Chest: Effort normal and breath sounds normal.  Abdominal: Soft. Bowel sounds are normal.  Musculoskeletal:  Left hand: Obvious dislocation of the third PIP joint.  Right ankle: Tender on lateral aspect of ankle.  Neurological: He is alert and oriented to person, place, and time.  Skin: Skin is warm and dry.  Psychiatric: He has a normal mood and affect. His behavior is normal.  Nursing note and vitals reviewed.    ED Treatments / Results  Labs (all labs ordered are listed, but only abnormal results are displayed) Labs Reviewed - No data to display  EKG None  Radiology Dg Ankle 2 Views Right  Result Date: 11/12/2017 CLINICAL DATA:  Pain EXAM: RIGHT ANKLE - 2 VIEW COMPARISON:  None. FINDINGS: No fracture or dislocation is seen. The ankle mortise is intact. The base of the fifth metatarsal is unremarkable. The visualized soft tissues are unremarkable. IMPRESSION: Negative. Electronically Signed   By: Charline Bills M.D.   On: 11/12/2017 10:30   Dg Hand Complete Left  Result Date: 11/12/2017 CLINICAL DATA:  Fall last night, left third finger pain EXAM: LEFT HAND - COMPLETE 3+ VIEW COMPARISON:  None. FINDINGS: There is dorsal/ulnar dislocation of the left third finger at the level of the PIP joint. Tiny osseous fragment at the ulnar margin of the distal aspect of the proximal phalanx in the left third finger may  represent a tiny avulsion fracture fragment. No additional fracture. No additional dislocation. No suspicious focal osseous lesions. No significant arthropathy. No radiopaque foreign body. IMPRESSION: 1. Dorsal/ulnar dislocation of the left third finger at the PIP joint. 2. Possible tiny avulsion fracture fragment at the ulnar margin of the distal aspect of the proximal phalanx in the left third finger. Electronically Signed   By: Delbert Phenix M.D.   On: 11/12/2017 10:29   Dg Finger Middle Left  Result Date: 11/12/2017 CLINICAL DATA:  Post reduction EXAM: LEFT MIDDLE FINGER 2+V COMPARISON:  None. FINDINGS: Interval reduction of the 3rd digit at the PIP joint. No definite fracture is seen. Visualized soft tissues are unremarkable. Stable marginal erosion along the ulnar aspect of the DIP joint. IMPRESSION: Interval reduction of the 3rd digit at the PIP joint. Electronically Signed   By: Charline Bills M.D.   On: 11/12/2017 12:13   Dg Foot Complete Right  Result Date: 11/12/2017 CLINICAL DATA:  Fall last night with right foot pain EXAM: RIGHT FOOT COMPLETE - 3+ VIEW COMPARISON:  None. FINDINGS: There is no evidence of fracture or dislocation. There is no evidence of arthropathy or other focal bone abnormality. Soft tissues are unremarkable. IMPRESSION: Negative. Electronically Signed   By: Delbert Phenix M.D.   On: 11/12/2017 10:26    Procedures Reduction of dislocation Date/Time: 11/12/2017 11:30 AM Performed by: Donnetta Hutching, MD Authorized by: Donnetta Hutching, MD  Consent: Verbal consent obtained. Risks and benefits: risks, benefits and alternatives were discussed Consent given by: patient Patient understanding: patient states understanding of the procedure being performed Test results: test results available and properly labeled Imaging studies: imaging studies available Patient identity confirmed: verbally with patient Local anesthesia used: yes Anesthesia: digital block  Anesthesia: Local  anesthesia used: yes Local Anesthetic: lidocaine 2% without epinephrine Anesthetic total (ml): 10.  Sedation: Patient sedated: no  Patient tolerance: Patient tolerated the procedure well with no immediate complications Comments: Patient presents with a dislocation of his PIP joint of the left third digit.  Digit was successfully reduced with extension and and traction at the joint.  Patient was able to flex digit after the procedure.  Neurovascular intact.    (including critical care time)  Medications Ordered in ED Medications  lidocaine (XYLOCAINE) 2 % injection 10 mL (10 mLs Infiltration Given by Other 11/12/17 1125)     Initial Impression / Assessment and Plan / ED Course  I have reviewed the triage vital signs and the nursing notes.  Pertinent labs & imaging results that were available during my care of the patient were reviewed by me and considered in my medical decision making (see chart for details).     Status post fall in  the middle the night resulting in a dislocation of the left third PIP joint.  This was successfully reduced in the emergency department with a digital block and traction.  He also sustained a sprain of his right ankle.  Finger and ankle were mobilized, ice, elevate, referral to orthopedics.  Final Clinical Impressions(s) / ED Diagnoses   Final diagnoses:  Dislocation of proximal interphalangeal joint of left middle finger, initial encounter  Sprain of right ankle, unspecified ligament, initial encounter    ED Discharge Orders    None       Donnetta Hutching, MD 11/12/17 1357

## 2017-11-12 NOTE — ED Triage Notes (Addendum)
Pt brought in by EMS due to fall. EMS reports that pt was intoxicated last night and injured right ankle. Then pt was sitting on porch in chair and got up to walk, fell off porch. EMS reports pt lying on back when they arrived,. Noted to have deformity to left middle finger

## 2018-04-02 ENCOUNTER — Emergency Department (HOSPITAL_COMMUNITY)
Admission: EM | Admit: 2018-04-02 | Discharge: 2018-04-02 | Disposition: A | Discharge status: Home / Self Care | Payer: Medicaid Other | Attending: Emergency Medicine | Admitting: Emergency Medicine

## 2018-04-02 ENCOUNTER — Encounter (HOSPITAL_COMMUNITY): Payer: Self-pay | Admitting: Emergency Medicine

## 2018-04-02 DIAGNOSIS — F17210 Nicotine dependence, cigarettes, uncomplicated: Secondary | ICD-10-CM | POA: Insufficient documentation

## 2018-04-02 DIAGNOSIS — H6501 Acute serous otitis media, right ear: Secondary | ICD-10-CM | POA: Diagnosis not present

## 2018-04-02 DIAGNOSIS — H6091 Unspecified otitis externa, right ear: Secondary | ICD-10-CM | POA: Insufficient documentation

## 2018-04-02 DIAGNOSIS — I5032 Chronic diastolic (congestive) heart failure: Secondary | ICD-10-CM | POA: Insufficient documentation

## 2018-04-02 DIAGNOSIS — Z79899 Other long term (current) drug therapy: Secondary | ICD-10-CM | POA: Diagnosis not present

## 2018-04-02 DIAGNOSIS — I110 Hypertensive heart disease with heart failure: Secondary | ICD-10-CM | POA: Diagnosis not present

## 2018-04-02 DIAGNOSIS — H9201 Otalgia, right ear: Secondary | ICD-10-CM | POA: Diagnosis present

## 2018-04-02 DIAGNOSIS — H60311 Diffuse otitis externa, right ear: Secondary | ICD-10-CM

## 2018-04-02 MED ORDER — AZITHROMYCIN 250 MG PO TABS: ORAL_TABLET | ORAL | 0 refills | Status: DC

## 2018-04-02 MED ORDER — NEOMYCIN-POLYMYXIN-HC 3.5-10000-1 OT SUSP: 4.0000 [drp] | Freq: Three times a day (TID) | OTIC | 0 refills | Status: AC

## 2018-04-02 MED ORDER — IBUPROFEN 600 MG PO TABS: 600.0000 mg | ORAL_TABLET | Freq: Four times a day (QID) | ORAL | 0 refills | Status: DC | PRN

## 2018-04-02 NOTE — ED Provider Notes (Signed)
[facility] EMERGENCY DEPARTMENT Provider Note   CSN: [id number] Arrival date & time: 04/02/18  1628     History   Chief Complaint Chief Complaint  Patient presents with  . Otalgia    HPI Jim Gordon is a 53 y.o. male with a history of lupus, alcohol abuse, hypertension presenting with a 6-day history of right ear pain and drainage and sensitivity to cold air.  He describes a constant throbbing sensation.  He has had a small quantity of dark drainage from the ear but has mostly been keeping the ear covered with a cotton ball after applying sweet oil which has not improved the pain.  He does endorse some nasal congestion without nasal rhinorrhea.  He has had no fevers or chills, he denies decreased hearing acuity, no dizziness.  He has had no other treatments prior to arrival.  The history is provided by the patient.    Past Medical History:  Diagnosis Date  . Alcohol abuse   . Discoid lupus   . Essential hypertension   . History of pneumonia   . PSVT (paroxysmal supraventricular tachycardia) [facility](HCC)     Patient Active Problem List   Diagnosis Date Noted  . Essential hypertension 10/11/2017  . Chest pain 10/11/2017  . 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 Tobacco abuse 10/11/2017  . Alcohol abuse 10/11/2017  . Chronic diastolic CHF (congestive heart failure) (HCC) 10/11/2017  . Pneumonia 03/22/2015  . Sepsis (HCC) 03/21/2015    Past Surgical History:  Procedure Laterality Date  . ELBOW SURGERY Left         Home Medications    Prior to Admission medications   Medication Sig Start Date End Date Taking? Authorizing Provider  azithromycin (ZITHROMAX Z-PAK) 250 MG tablet Take 2 tablets by mouth on day one followed by one tablet daily for 4 days. 04/02/18   Degan Hanser, Raynelle FanningJulie, PA-C  ibuprofen (ADVIL,MOTRIN) 600 MG tablet Take 1 tablet (600 mg total) by mouth every 6 (six) hours as needed. 04/02/18   Burgess AmorIdol, Kaylub Detienne, PA-C  metoprolol tartrate (LOPRESSOR) 25 MG tablet Take 1 tablet (25 mg  total) by mouth 2 (two) times daily. 10/11/17 10/11/18  Philip AspenHernandez Acosta, Limmie PatriciaEstela Y, MD  neomycin-polymyxin-hydrocortisone (CORTISPORIN) 3.5-10000-1 OTIC suspension Place 4 drops into the right ear 3 (three) times daily for 7 days. 04/02/18 04/09/18  Burgess AmorIdol, Lorrin Bodner, PA-C    Family History Family History  Problem Relation Age of Onset  . Diabetes Other   . Arthritis Mother     Social History Social History   Tobacco Use  . Smoking status: Current Every Day Smoker    Packs/day: 1.00    Years: 20.00    Pack years: 20.00    Types: Cigarettes    Start date: 09/23/1986  . Smokeless tobacco: Never Used  Substance Use Topics  . Alcohol use: Yes    Alcohol/week: 50.0 standard drinks    Types: 50 Cans of beer per week  . Drug use: No     Allergies   Bee venom and Penicillins   Review of Systems Review of Systems  Constitutional: Negative for chills and fever.  HENT: Positive for congestion, ear discharge, ear pain and sinus pressure. Negative for postnasal drip, rhinorrhea, sore throat, trouble swallowing and voice change.   Eyes: Negative for discharge.  Respiratory: Negative for cough, shortness of breath, wheezing and stridor.   Cardiovascular: Negative for chest pain.  Gastrointestinal: Negative for abdominal pain.  Genitourinary: Negative.      Physical Exam Updated Vital  Signs BP (!) 151/95 (BP Location: Right Arm)   Pulse 65   Temp 97.6 F (36.4 C) (Temporal)   Resp 16   Ht 6\' 1"  (1.854 m)   Wt 83.9 kg   SpO2 100%   BMI 24.41 kg/m   Physical Exam Constitutional:      Appearance: He is well-developed.  HENT:     Head: Normocephalic and atraumatic.     Right Ear: Ear canal normal. Drainage present. There is no impacted cerumen. No foreign body. No mastoid tenderness. Tympanic membrane is erythematous and bulging.     Left Ear: Tympanic membrane and ear canal normal.     Ears:     Comments: There is a white to yellow liquid exudate in the right ear, with scattered  chunky yellow discharge as well.  There is no cerumen impaction.The external canal is mildly erythematous without significant edema.    Nose: Mucosal edema present. No rhinorrhea.     Mouth/Throat:     Pharynx: Uvula midline. No oropharyngeal exudate or posterior oropharyngeal erythema.     Tonsils: No tonsillar abscesses.  Eyes:     Conjunctiva/sclera: Conjunctivae normal.  Cardiovascular:     Rate and Rhythm: Normal rate.     Heart sounds: Normal heart sounds.  Pulmonary:     Effort: Pulmonary effort is normal. No respiratory distress.     Breath sounds: No wheezing or rales.  Musculoskeletal: Normal range of motion.  Skin:    General: Skin is warm and dry.     Findings: Lesion present.     Comments: Hypopigmented lesions on face consistent with lupus.  Neurological:     Mental Status: He is alert and oriented to person, place, and time.      ED Treatments / Results  Labs (all labs ordered are listed, but only abnormal results are displayed) Labs Reviewed - No data to display  EKG None  Radiology No results found.  Procedures Procedures (including critical care time)  Medications Ordered in ED Medications - No data to display   Initial Impression / Assessment and Plan / ED Course  I have reviewed the triage vital signs and the nursing notes.  Pertinent labs & imaging results that were available during my care of the patient were reviewed by me and considered in my medical decision making (see chart for details).     Patient with right otitis media with questionable external otitis as well.  He was treated for both conditions with a Z-Pak given his penicillin allergy and Cortisporin drops.  He was advised ibuprofen for pain relief.  Plan follow-up for any persistent or worsening symptoms.  He was given referrals for local clinics to establish primary care.  Final Clinical Impressions(s) / ED Diagnoses   Final diagnoses:  Acute diffuse otitis externa of right ear   Non-recurrent acute serous otitis media of right ear    ED Discharge Orders         Ordered    neomycin-polymyxin-hydrocortisone (CORTISPORIN) 3.5-10000-1 OTIC suspension  3 times daily     04/02/18 1722    azithromycin (ZITHROMAX Z-PAK) 250 MG tablet     04/02/18 1722    ibuprofen (ADVIL,MOTRIN) 600 MG tablet  Every 6 hours PRN     04/02/18 1725           Burgess Amordol, Coila Wardell, PA-C 04/02/18 1738    Long, Arlyss RepressJoshua G, MD 04/03/18 1005

## 2018-04-02 NOTE — ED Triage Notes (Signed)
Pt reports having right ear pain since last week.  Has been using otc drops for "ear infection".

## 2018-04-02 NOTE — Discharge Instructions (Signed)
Use the medications as prescribed, take your entire course of the Zithromax tablets.  Apply the drops as instructed to your right ear for the next 7 days.  Also recommend taking ibuprofen which can help with pain relief.  Plan to get rechecked if your symptoms are not improving with this treatment.

## 2018-12-03 ENCOUNTER — Emergency Department (HOSPITAL_COMMUNITY)
Admission: EM | Admit: 2018-12-03 | Discharge: 2018-12-03 | Disposition: A | Discharge status: Home / Self Care | Payer: Medicaid Other | Attending: Emergency Medicine | Admitting: Emergency Medicine

## 2018-12-03 ENCOUNTER — Encounter (HOSPITAL_COMMUNITY): Payer: Self-pay

## 2018-12-03 ENCOUNTER — Other Ambulatory Visit: Payer: Self-pay

## 2018-12-03 DIAGNOSIS — F17210 Nicotine dependence, cigarettes, uncomplicated: Secondary | ICD-10-CM | POA: Diagnosis not present

## 2018-12-03 DIAGNOSIS — Z9114 Patient's other noncompliance with medication regimen: Secondary | ICD-10-CM | POA: Diagnosis not present

## 2018-12-03 DIAGNOSIS — I10 Essential (primary) hypertension: Secondary | ICD-10-CM | POA: Diagnosis present

## 2018-12-03 DIAGNOSIS — Z79899 Other long term (current) drug therapy: Secondary | ICD-10-CM | POA: Diagnosis not present

## 2018-12-03 LAB — COMPREHENSIVE METABOLIC PANEL
ALT: 46 U/L — ABNORMAL HIGH (ref 0–44)
AST: 62 U/L — ABNORMAL HIGH (ref 15–41)
Albumin: 4.6 g/dL (ref 3.5–5.0)
Alkaline Phosphatase: 65 U/L (ref 38–126)
Anion gap: 12 (ref 5–15)
BUN: 8 mg/dL (ref 6–20)
CO2: 24 mmol/L (ref 22–32)
Calcium: 9 mg/dL (ref 8.9–10.3)
Chloride: 105 mmol/L (ref 98–111)
Creatinine, Ser: 0.8 mg/dL (ref 0.61–1.24)
GFR calc Af Amer: >60 mL/min (ref >60)
GFR calc non Af Amer: >60 mL/min (ref >60)
Glucose, Bld: 85 mg/dL (ref 70–99)
Potassium: 4.2 mmol/L (ref 3.5–5.1)
Sodium: 141 mmol/L (ref 135–145)
Total Bilirubin: 0.5 mg/dL (ref 0.3–1.2)
Total Protein: 9 g/dL — ABNORMAL HIGH (ref 6.5–8.1)

## 2018-12-03 LAB — CBC WITH DIFFERENTIAL/PLATELET
Abs Immature Granulocytes: 0 10*3/uL (ref 0.00–0.07)
Basophils Absolute: 0 10*3/uL (ref 0.0–0.1)
Basophils Relative: 1 %
Eosinophils Absolute: 0.1 10*3/uL (ref 0.0–0.5)
Eosinophils Relative: 2 %
HCT: 38.5 % — ABNORMAL LOW (ref 39.0–52.0)
Hemoglobin: 12.6 g/dL — ABNORMAL LOW (ref 13.0–17.0)
Immature Granulocytes: 0 %
Lymphocytes Relative: 52 %
Lymphs Abs: 2 10*3/uL (ref 0.7–4.0)
MCH: 30.4 pg (ref 26.0–34.0)
MCHC: 32.7 g/dL (ref 30.0–36.0)
MCV: 93 fL (ref 80.0–100.0)
Monocytes Absolute: 0.3 10*3/uL (ref 0.1–1.0)
Monocytes Relative: 8 %
Neutro Abs: 1.4 10*3/uL — ABNORMAL LOW (ref 1.7–7.7)
Neutrophils Relative %: 37 %
Platelets: 161 10*3/uL (ref 150–400)
RBC: 4.14 MIL/uL — ABNORMAL LOW (ref 4.22–5.81)
RDW: 14.2 % (ref 11.5–15.5)
WBC: 3.8 10*3/uL — ABNORMAL LOW (ref 4.0–10.5)
nRBC: 0 % (ref 0.0–0.2)

## 2018-12-03 MED ORDER — METOPROLOL TARTRATE 25 MG PO TABS
25.0000 mg | ORAL_TABLET | Freq: Once | ORAL | Status: AC
  Administered 2018-12-03: 25 mg via ORAL
  Filled 2018-12-03: qty 1

## 2018-12-03 MED ORDER — METOPROLOL TARTRATE 25 MG PO TABS: 25.0000 mg | ORAL_TABLET | Freq: Two times a day (BID) | ORAL | 5 refills | Status: DC

## 2018-12-03 NOTE — ED Provider Notes (Signed)
[facility] EMERGENCY DEPARTMENT Provider Note   CSN: [id number] Arrival date & time: 12/03/18  2033     History   Chief Complaint Chief Complaint  Patient presents with  . Hypertension    HPI Jim Gordon is a 53 y.o. male.     Patient presents to the emergency department because he is concerned that he has run out of his blood pressure medication.  He has not had it for 5 days.  He does not currently have a primary care doctor and cannot get refills.  He has been experiencing intermittent headache but is been a unusual headaches for him.  No associated blurred vision, chest pain, heart palpitations, numbness, tingling or weakness of extremities.     Past Medical History:  Diagnosis Date  . Alcohol abuse   . Discoid lupus   . Essential hypertension   . History of pneumonia   . PSVT (paroxysmal supraventricular tachycardia) [facility])     Patient Active Problem List   Diagnosis Date Noted  . Essential hypertension 10/11/2017  . Chest pain 10/11/2017  . 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 Tobacco abuse 10/11/2017  . Alcohol abuse 10/11/2017  . Chronic diastolic CHF (congestive heart failure) ([place]) 10/11/2017  . Pneumonia 03/22/2015  . Sepsis ([place]) 03/21/2015    Past Surgical History:  Procedure Laterality Date  . ELBOW SURGERY Left         Home Medications    Prior to Admission medications   Medication Sig Start Date End Date Taking? Authorizing Provider  azithromycin (ZITHROMAX Z-PAK) 250 MG tablet Take 2 tablets by mouth on day one followed by one tablet daily for 4 days. 04/02/18   Idol, Almyra Free, PA-C  ibuprofen (ADVIL,MOTRIN) 600 MG tablet Take 1 tablet (600 mg total) by mouth every 6 (six) hours as needed. 04/02/18   Evalee Jefferson, PA-C  metoprolol tartrate (LOPRESSOR) 25 MG tablet Take 1 tablet (25 mg total) by mouth 2 (two) times daily. 12/03/18   Orpah Greek, MD    Family History Family History  Problem Relation Age of Onset  . Diabetes Other   .  Arthritis Mother     Social History Social History   Tobacco Use  . Smoking status: Current Every Day Smoker    Packs/day: 1.00    Years: 20.00    Pack years: 20.00    Types: Cigarettes    Start date: 09/23/1986  . Smokeless tobacco: Never Used  Substance Use Topics  . Alcohol use: Yes    Alcohol/week: 50.0 standard drinks    Types: 50 Cans of beer per week  . Drug use: No     Allergies   Bee venom and Penicillins   Review of Systems Review of Systems  Neurological: Positive for headaches.  All other systems reviewed and are negative.    Physical Exam Updated Vital Signs BP (!) 145/98   Pulse 79   Temp 98 F (36.7 C) (Oral)   Resp 18   Ht 6' 1.5" (1.867 m)   Wt 83 kg   SpO2 100%   BMI 23.82 kg/m   Physical Exam Vitals signs and nursing note reviewed.  Constitutional:      General: He is not in acute distress.    Appearance: Normal appearance. He is well-developed.  HENT:     Head: Normocephalic and atraumatic.     Right Ear: Hearing normal.     Left Ear: Hearing normal.     Nose: Nose normal.  Eyes:  Conjunctiva/sclera: Conjunctivae normal.     Pupils: Pupils are equal, round, and reactive to light.  Neck:     Musculoskeletal: Normal range of motion and neck supple.  Cardiovascular:     Rate and Rhythm: Regular rhythm.     Heart sounds: S1 normal and S2 normal. No murmur. No friction rub. No gallop.   Pulmonary:     Effort: Pulmonary effort is normal. No respiratory distress.     Breath sounds: Normal breath sounds.  Chest:     Chest wall: No tenderness.  Abdominal:     General: Bowel sounds are normal.     Palpations: Abdomen is soft.     Tenderness: There is no abdominal tenderness. There is no guarding or rebound. Negative signs include Murphy's sign and McBurney's sign.     Hernia: No hernia is present.  Musculoskeletal: Normal range of motion.  Skin:    General: Skin is warm and dry.     Findings: No rash.  Neurological:     Mental  Status: He is alert and oriented to person, place, and time.     GCS: GCS eye subscore is 4. GCS verbal subscore is 5. GCS motor subscore is 6.     Cranial Nerves: No cranial nerve deficit.     Sensory: No sensory deficit.     Coordination: Coordination normal.  Psychiatric:        Speech: Speech normal.        Behavior: Behavior normal.        Thought Content: Thought content normal.      ED Treatments / Results  Labs (all labs ordered are listed, but only abnormal results are displayed) Labs Reviewed  CBC WITH DIFFERENTIAL/PLATELET - Abnormal; Notable for the following components:      Result Value   WBC 3.8 (*)    RBC 4.14 (*)    Hemoglobin 12.6 (*)    HCT 38.5 (*)    Neutro Abs 1.4 (*)    All other components within normal limits  COMPREHENSIVE METABOLIC PANEL    EKG None  Radiology No results found.  Procedures Procedures (including critical care time)  Medications Ordered in ED Medications  metoprolol tartrate (LOPRESSOR) tablet 25 mg (has no administration in time range)     Initial Impression / Assessment and Plan / ED Course  I have reviewed the triage vital signs and the nursing notes.  Pertinent labs & imaging results that were available during my care of the patient were reviewed by me and considered in my medical decision making (see chart for details).        Patient concerned about his blood pressure because he has been out of his blood pressure medication for 5 days.  He is mildly hypertensive here in the ER.  He has been experiencing intermittent headache but this is not an unusual headache for him he does not have any associated neurologic features.  His neurologic exam is normal here in the ER.  We will refill his Lopressor and give him contact information for primary care doctors.  Final Clinical Impressions(s) / ED Diagnoses   Final diagnoses:  Essential hypertension    ED Discharge Orders         Ordered    metoprolol tartrate  (LOPRESSOR) 25 MG tablet  2 times daily     12/03/18 2314           Gilda CreasePollina, Christopher J, MD 12/03/18 2314

## 2018-12-03 NOTE — ED Triage Notes (Signed)
Pt presents to ED with complaints of hypertension. Pt states he has been out of his BP meds x 1 week. Pt c/o headache x 3 days.

## 2018-12-03 NOTE — ED Notes (Signed)
Pt reports MD denied his refill on BP meds. Has been without for 5 days. NAD noted.

## 2019-05-08 ENCOUNTER — Emergency Department (HOSPITAL_COMMUNITY): Payer: Medicaid Other

## 2019-05-08 ENCOUNTER — Other Ambulatory Visit: Payer: Self-pay

## 2019-05-08 ENCOUNTER — Encounter (HOSPITAL_COMMUNITY): Payer: Self-pay

## 2019-05-08 ENCOUNTER — Emergency Department (HOSPITAL_COMMUNITY)
Admission: EM | Admit: 2019-05-08 | Discharge: 2019-05-08 | Disposition: A | Discharge status: Home / Self Care | Payer: Medicaid Other | Attending: Emergency Medicine | Admitting: Emergency Medicine

## 2019-05-08 DIAGNOSIS — Z79899 Other long term (current) drug therapy: Secondary | ICD-10-CM | POA: Insufficient documentation

## 2019-05-08 DIAGNOSIS — H5712 Ocular pain, left eye: Secondary | ICD-10-CM | POA: Diagnosis not present

## 2019-05-08 DIAGNOSIS — R6884 Jaw pain: Secondary | ICD-10-CM | POA: Diagnosis not present

## 2019-05-08 DIAGNOSIS — I10 Essential (primary) hypertension: Secondary | ICD-10-CM

## 2019-05-08 DIAGNOSIS — I110 Hypertensive heart disease with heart failure: Secondary | ICD-10-CM | POA: Diagnosis not present

## 2019-05-08 DIAGNOSIS — F10129 Alcohol abuse with intoxication, unspecified: Secondary | ICD-10-CM | POA: Diagnosis not present

## 2019-05-08 DIAGNOSIS — Y908 Blood alcohol level of 240 mg/100 ml or more: Secondary | ICD-10-CM | POA: Diagnosis not present

## 2019-05-08 DIAGNOSIS — R519 Headache, unspecified: Secondary | ICD-10-CM | POA: Diagnosis not present

## 2019-05-08 DIAGNOSIS — I5032 Chronic diastolic (congestive) heart failure: Secondary | ICD-10-CM | POA: Insufficient documentation

## 2019-05-08 DIAGNOSIS — F17210 Nicotine dependence, cigarettes, uncomplicated: Secondary | ICD-10-CM | POA: Insufficient documentation

## 2019-05-08 DIAGNOSIS — F1010 Alcohol abuse, uncomplicated: Secondary | ICD-10-CM

## 2019-05-08 LAB — CBC WITH DIFFERENTIAL/PLATELET
Abs Immature Granulocytes: 0.01 10*3/uL (ref 0.00–0.07)
Basophils Absolute: 0 10*3/uL (ref 0.0–0.1)
Basophils Relative: 1 %
Eosinophils Absolute: 0.1 10*3/uL (ref 0.0–0.5)
Eosinophils Relative: 2 %
HCT: 38.9 % — ABNORMAL LOW (ref 39.0–52.0)
Hemoglobin: 12.8 g/dL — ABNORMAL LOW (ref 13.0–17.0)
Immature Granulocytes: 0 %
Lymphocytes Relative: 42 %
Lymphs Abs: 1.6 10*3/uL (ref 0.7–4.0)
MCH: 30.5 pg (ref 26.0–34.0)
MCHC: 32.9 g/dL (ref 30.0–36.0)
MCV: 92.8 fL (ref 80.0–100.0)
Monocytes Absolute: 0.4 10*3/uL (ref 0.1–1.0)
Monocytes Relative: 11 %
Neutro Abs: 1.6 10*3/uL — ABNORMAL LOW (ref 1.7–7.7)
Neutrophils Relative %: 44 %
Platelets: 112 10*3/uL — ABNORMAL LOW (ref 150–400)
RBC: 4.19 MIL/uL — ABNORMAL LOW (ref 4.22–5.81)
RDW: 12.9 % (ref 11.5–15.5)
WBC: 3.8 10*3/uL — ABNORMAL LOW (ref 4.0–10.5)
nRBC: 0 % (ref 0.0–0.2)

## 2019-05-08 LAB — URINALYSIS, ROUTINE W REFLEX MICROSCOPIC
Bacteria, UA: NONE SEEN
Bilirubin Urine: NEGATIVE
Glucose, UA: NEGATIVE mg/dL
Ketones, ur: NEGATIVE mg/dL
Leukocytes,Ua: NEGATIVE
Nitrite: NEGATIVE
Protein, ur: NEGATIVE mg/dL
Specific Gravity, Urine: 1.001 — ABNORMAL LOW (ref 1.005–1.030)
pH: 5 (ref 5.0–8.0)

## 2019-05-08 LAB — RAPID URINE DRUG SCREEN, HOSP PERFORMED
Amphetamines: NOT DETECTED
Barbiturates: NOT DETECTED
Benzodiazepines: NOT DETECTED
Cocaine: NOT DETECTED
Opiates: NOT DETECTED
Tetrahydrocannabinol: NOT DETECTED

## 2019-05-08 LAB — BASIC METABOLIC PANEL
Anion gap: 9 (ref 5–15)
BUN: 8 mg/dL (ref 6–20)
CO2: 25 mmol/L (ref 22–32)
Calcium: 8.6 mg/dL — ABNORMAL LOW (ref 8.9–10.3)
Chloride: 104 mmol/L (ref 98–111)
Creatinine, Ser: 0.76 mg/dL (ref 0.61–1.24)
GFR calc Af Amer: >60 mL/min (ref >60)
GFR calc non Af Amer: >60 mL/min (ref >60)
Glucose, Bld: 92 mg/dL (ref 70–99)
Potassium: 4.4 mmol/L (ref 3.5–5.1)
Sodium: 138 mmol/L (ref 135–145)

## 2019-05-08 LAB — ETHANOL: Alcohol, Ethyl (B): 357 mg/dL (ref <10)

## 2019-05-08 MED ORDER — SODIUM CHLORIDE 0.9 % IV BOLUS
1000.0000 mL | Freq: Once | INTRAVENOUS | Status: AC
  Administered 2019-05-08: 1000 mL via INTRAVENOUS

## 2019-05-08 MED ORDER — METOPROLOL TARTRATE 25 MG PO TABS
25.0000 mg | ORAL_TABLET | Freq: Once | ORAL | Status: AC
  Administered 2019-05-08: 11:00:00 25 mg via ORAL
  Filled 2019-05-08: qty 1

## 2019-05-08 NOTE — ED Provider Notes (Signed)
[facility] EMERGENCY DEPARTMENT Provider Note   CSN: [id number] Arrival date & time: 05/08/19  1042     History Chief Complaint  Patient presents with  . Headache    Jim Gordon is a 54 y.o. male.  HPI     Jim Gordon is a 54 y.o. male who presents to the Emergency Department complaining of left sided headache and eye pain.  He states the pain began on the evening prior to arrival.  Gradual onset and improved upon waking.  The pain returned shortly before ER arrival while standing outside smoking a cigarette and he reports a near syncopal episode.   He describes having a sharp pain to his left eye that radiates to his left temple. Not associated with nausea, vomiting, neck pain or stiffness and visual changes.  He also states that he has not taken his blood pressure medication for 3 days because he has been drinking alcohol.  He denies marijuana and cocaine use, chest pain and shortness of breath      Past Medical History:  Diagnosis Date  . Alcohol abuse   . Discoid lupus   . Essential hypertension   . History of pneumonia   . PSVT (paroxysmal supraventricular tachycardia) [facility])     Patient Active Problem List   Diagnosis Date Noted  . Essential hypertension 10/11/2017  . Chest pain 10/11/2017  . 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 Tobacco abuse 10/11/2017  . Alcohol abuse 10/11/2017  . Chronic diastolic CHF (congestive heart failure) (HCC) 10/11/2017  . Pneumonia 03/22/2015  . Sepsis (HCC) 03/21/2015    Past Surgical History:  Procedure Laterality Date  . ELBOW SURGERY Left        Family History  Problem Relation Age of Onset  . Diabetes Other   . Arthritis Mother     Social History   Tobacco Use  . Smoking status: Current Every Day Smoker    Packs/day: 1.00    Years: 20.00    Pack years: 20.00    Types: Cigarettes    Start date: 09/23/1986  . Smokeless tobacco: Never Used  Substance Use Topics  . Alcohol use: Yes    Alcohol/week: 50.0 standard  drinks    Types: 50 Cans of beer per week  . Drug use: No    Home Medications Prior to Admission medications   Medication Sig Start Date End Date Taking? Authorizing Provider  ibuprofen (ADVIL,MOTRIN) 600 MG tablet Take 1 tablet (600 mg total) by mouth every 6 (six) hours as needed. 04/02/18   Burgess Amor, PA-C  metoprolol tartrate (LOPRESSOR) 25 MG tablet Take 1 tablet (25 mg total) by mouth 2 (two) times daily. 12/03/18   Gilda Crease, MD    Allergies    Bee venom and Penicillins  Review of Systems   Review of Systems  Constitutional: Negative for activity change, appetite change and fever.  HENT: Negative for ear pain and trouble swallowing.   Eyes: Negative for photophobia, pain and visual disturbance.  Respiratory: Negative for chest tightness and shortness of breath.   Cardiovascular: Negative for chest pain.  Gastrointestinal: Negative for abdominal pain, nausea and vomiting.  Musculoskeletal: Negative for arthralgias, neck pain and neck stiffness.  Skin: Negative for rash and wound.  Neurological: Positive for headaches. Negative for dizziness, syncope, facial asymmetry, speech difficulty, weakness and numbness.  Psychiatric/Behavioral: Negative for confusion and decreased concentration.    Physical Exam Updated Vital Signs BP 113/81   Pulse 66   Temp 98.3 F (  36.8 C) (Oral)   Resp 16   Ht 6\' 1"  (1.854 m)   Wt 83.9 kg   SpO2 99%   BMI 24.41 kg/m   Physical Exam Vitals and nursing note reviewed.  Constitutional:      Appearance: He is not ill-appearing or toxic-appearing.     Comments: Pt smells of alcohol and appears intoxicated  HENT:     Mouth/Throat:     Mouth: Mucous membranes are moist.  Eyes:     Extraocular Movements: Extraocular movements intact.     Right eye: Normal extraocular motion.     Left eye: Normal extraocular motion.     Pupils: Pupils are equal, round, and reactive to light.  Neck:     Meningeal: Kernig's sign absent.    Cardiovascular:     Rate and Rhythm: Normal rate and regular rhythm.  Pulmonary:     Effort: Pulmonary effort is normal.     Breath sounds: Normal breath sounds.  Abdominal:     Palpations: Abdomen is soft.     Tenderness: There is no abdominal tenderness.  Musculoskeletal:        General: No tenderness. Normal range of motion.     Cervical back: Normal range of motion.  Skin:    General: Skin is warm.     Capillary Refill: Capillary refill takes less than 2 seconds.     Findings: No erythema or rash.  Neurological:     General: No focal deficit present.     Mental Status: He is alert.     GCS: GCS eye subscore is 4. GCS verbal subscore is 5. GCS motor subscore is 6.     Sensory: No sensory deficit.     Motor: No weakness.  Psychiatric:        Mood and Affect: Mood normal.     ED Results / Procedures / Treatments   Labs (all labs ordered are listed, but only abnormal results are displayed) Labs Reviewed  ETHANOL - Abnormal; Notable for the following components:      Result Value   Alcohol, Ethyl (B) 357 (*)    All other components within normal limits  URINALYSIS, ROUTINE W REFLEX MICROSCOPIC - Abnormal; Notable for the following components:   Color, Urine COLORLESS (*)    Specific Gravity, Urine 1.001 (*)    Hgb urine dipstick SMALL (*)    All other components within normal limits  BASIC METABOLIC PANEL - Abnormal; Notable for the following components:   Calcium 8.6 (*)    All other components within normal limits  CBC WITH DIFFERENTIAL/PLATELET - Abnormal; Notable for the following components:   WBC 3.8 (*)    RBC 4.19 (*)    Hemoglobin 12.8 (*)    HCT 38.9 (*)    Platelets 112 (*)    Neutro Abs 1.6 (*)    All other components within normal limits  RAPID URINE DRUG SCREEN, HOSP PERFORMED    EKG EKG Interpretation  Date/Time:  Wednesday May 08 2019 12:11:00 EST Ventricular Rate:  69 PR Interval:    QRS Duration: 97 QT Interval:  423 QTC  Calculation: 454 R Axis:   89 Text Interpretation: Sinus rhythm Prolonged PR interval Left ventricular hypertrophy ST elevation suggests acute pericarditis Baseline wander in lead(s) I aVR V2 V4 Confirmed by Ronnald Nian, Adam ([id number]) on 05/10/2019 8:28:03 AM   Radiology CT HEAD WO CONTRAST  Result Date: 05/08/2019 CLINICAL DATA:  LEFT eye pain and jaw pain EXAM: CT HEAD WITHOUT CONTRAST  TECHNIQUE: Contiguous axial images were obtained from the base of the skull through the vertex without intravenous contrast. COMPARISON:  06/09/2012 FINDINGS: Brain: No acute intracranial hemorrhage. No focal mass lesion. No CT evidence of acute infarction. No midline shift or mass effect. No hydrocephalus. Basilar cisterns are patent. There are mild periventricular and subcortical white matter hypodensities. Vascular: No hyperdense vessel or unexpected calcification. Skull: Normal. Negative for fracture or focal lesion. Sinuses/Orbits: Mild mucosal thickening within the paranasal sinuses. Orbits appear normal. Abnormality of the LEFT thigh identified. Other: None. IMPRESSION: 1. No acute intracranial findings. 2. Chronic sinus inflammation. Electronically Signed   By: Genevive Bi M.D.   On: 05/08/2019 12:01    Procedures Procedures (including critical care time)  Medications Ordered in ED Medications  metoprolol tartrate (LOPRESSOR) tablet 25 mg (25 mg Oral Given 05/08/19 1129)  sodium chloride 0.9 % bolus 1,000 mL (0 mLs Intravenous Stopped 05/08/19 1525)    ED Course  I have reviewed the triage vital signs and the nursing notes.  Pertinent labs & imaging results that were available during my care of the patient were reviewed by me and considered in my medical decision making (see chart for details).    MDM Rules/Calculators/A&P                      Patient here with left-sided headache and near syncopal episode that occurred just before ER arrival.  No focal neuro deficits on exam.  Patient appears  intoxicated.  Will obtain labs and CT head.  Patient is hypertensive, admits that he has not taken his blood pressure medications in 3 days.  He has been seen in this emergency department before for hypertension and admits to poor medication compliance.  ETOH of 357, CT head reassuring.  No focal neuro deficits.  Pt was given his routine dose of his anti-hypertensive medication, recheck of pressure shows systolic of 100.  IVF's given.   1555  On recheck, pt resting comfortably and watching TV.  Headache has resolved.  He has drank fluids and ate a meal tray and states he is ready for discharge home. BP now 113 systolic. Pt ambulates in the exam room and gait is steady. Pt no longer appears intoxicated and has friend here to take him home.     Final Clinical Impression(s) / ED Diagnoses Final diagnoses:  Acute nonintractable headache, unspecified headache type  ETOH abuse  Essential hypertension    Rx / DC Orders ED Discharge Orders    None       Pauline Aus, PA-C 05/10/19 2330    Sabas Sous, MD 05/12/19 1600

## 2019-05-08 NOTE — ED Triage Notes (Signed)
Pt c/o 10/10 pain in his left eye and jaw. Started in the middle of the night.

## 2019-05-08 NOTE — Discharge Instructions (Addendum)
Try to stop drinking alcohol.  Take your blood pressure medication every day.  Follow-up with your doctor or the clinic listed for recheck

## 2019-05-08 NOTE — ED Notes (Addendum)
Date and time results received: 05/08/19 1244 (use smartphrase ".now" to insert current time)  Test: etoh Critical Value: 357  Name of Provider Notified: Pauline Aus PA  Orders Received? Or Actions Taken?:

## 2019-05-21 ENCOUNTER — Encounter (HOSPITAL_COMMUNITY): Payer: Self-pay | Admitting: Emergency Medicine

## 2019-05-21 ENCOUNTER — Emergency Department (HOSPITAL_COMMUNITY)
Admission: EM | Admit: 2019-05-21 | Discharge: 2019-05-21 | Disposition: A | Discharge status: Home / Self Care | Payer: Medicaid Other | Attending: Emergency Medicine | Admitting: Emergency Medicine

## 2019-05-21 ENCOUNTER — Other Ambulatory Visit: Payer: Self-pay

## 2019-05-21 DIAGNOSIS — I471 Supraventricular tachycardia: Secondary | ICD-10-CM | POA: Diagnosis not present

## 2019-05-21 DIAGNOSIS — F17210 Nicotine dependence, cigarettes, uncomplicated: Secondary | ICD-10-CM | POA: Diagnosis not present

## 2019-05-21 DIAGNOSIS — Z79899 Other long term (current) drug therapy: Secondary | ICD-10-CM | POA: Insufficient documentation

## 2019-05-21 DIAGNOSIS — I110 Hypertensive heart disease with heart failure: Secondary | ICD-10-CM | POA: Diagnosis not present

## 2019-05-21 DIAGNOSIS — I5032 Chronic diastolic (congestive) heart failure: Secondary | ICD-10-CM | POA: Diagnosis not present

## 2019-05-21 DIAGNOSIS — R079 Chest pain, unspecified: Secondary | ICD-10-CM | POA: Diagnosis present

## 2019-05-21 LAB — BASIC METABOLIC PANEL
Anion gap: 11 (ref 5–15)
BUN: 8 mg/dL (ref 6–20)
CO2: 23 mmol/L (ref 22–32)
Calcium: 9.3 mg/dL (ref 8.9–10.3)
Chloride: 100 mmol/L (ref 98–111)
Creatinine, Ser: 0.97 mg/dL (ref 0.61–1.24)
GFR calc Af Amer: >60 mL/min (ref >60)
GFR calc non Af Amer: >60 mL/min (ref >60)
Glucose, Bld: 101 mg/dL — ABNORMAL HIGH (ref 70–99)
Potassium: 3.8 mmol/L (ref 3.5–5.1)
Sodium: 134 mmol/L — ABNORMAL LOW (ref 135–145)

## 2019-05-21 LAB — CBC WITH DIFFERENTIAL/PLATELET
Abs Immature Granulocytes: 0 10*3/uL (ref 0.00–0.07)
Basophils Absolute: 0 10*3/uL (ref 0.0–0.1)
Basophils Relative: 1 %
Eosinophils Absolute: 0.2 10*3/uL (ref 0.0–0.5)
Eosinophils Relative: 4 %
HCT: 39.7 % (ref 39.0–52.0)
Hemoglobin: 13.2 g/dL (ref 13.0–17.0)
Immature Granulocytes: 0 %
Lymphocytes Relative: 49 %
Lymphs Abs: 2 10*3/uL (ref 0.7–4.0)
MCH: 30.6 pg (ref 26.0–34.0)
MCHC: 33.2 g/dL (ref 30.0–36.0)
MCV: 92.1 fL (ref 80.0–100.0)
Monocytes Absolute: 0.4 10*3/uL (ref 0.1–1.0)
Monocytes Relative: 11 %
Neutro Abs: 1.4 10*3/uL — ABNORMAL LOW (ref 1.7–7.7)
Neutrophils Relative %: 35 %
Platelets: 206 10*3/uL (ref 150–400)
RBC: 4.31 MIL/uL (ref 4.22–5.81)
RDW: 13.1 % (ref 11.5–15.5)
WBC: 4 10*3/uL (ref 4.0–10.5)
nRBC: 0 % (ref 0.0–0.2)

## 2019-05-21 LAB — TSH: TSH: 3.013 u[IU]/mL (ref 0.350–4.500)

## 2019-05-21 LAB — MAGNESIUM: Magnesium: 2.2 mg/dL (ref 1.7–2.4)

## 2019-05-21 MED ORDER — ADENOSINE 6 MG/2ML IV SOLN
6.0000 mg | Freq: Once | INTRAVENOUS | Status: AC
  Administered 2019-05-21: 6 mg via INTRAVENOUS

## 2019-05-21 MED ORDER — ADENOSINE 6 MG/2ML IV SOLN
INTRAVENOUS | Status: AC
  Filled 2019-05-21: qty 6

## 2019-05-21 NOTE — ED Triage Notes (Signed)
CP in LT side of chest and Headache since 10am this morning

## 2019-05-21 NOTE — ED Notes (Signed)
Pt placed on Zoll monitor.

## 2019-05-21 NOTE — Discharge Instructions (Addendum)
You were seen in the emergency department for chest pain and headache and were found to have a very fast heart rate.  You were given medications which slowed her heart rate down and improved her symptoms.  You felt well at the time of discharge.  You have had this rhythm before so it may be helpful to you to follow-up with cardiology.  We are giving you the number for their clinic.  If you have any worsening symptoms please return to the emergency department.

## 2019-05-21 NOTE — ED Notes (Signed)
Dr Butler at bedside.  

## 2019-05-21 NOTE — ED Provider Notes (Signed)
[facility] EMERGENCY DEPARTMENT Provider Note   CSN: [id number] Arrival date & time: 05/21/19  1308     History Chief Complaint  Patient presents with  . Chest Pain    Jim Gordon is a 54 y.o. male.  He has a history of SVT and lupus.  He is complaining of chest tightness and a mild headache that started around 10 AM this morning.  He said this is happened before.  He says it likely because he is not doing the things he supposed to be doing and still smoking and drinking.  He denies any drugs including cocaine.  The history is provided by the patient.  Chest Pain Pain location:  Substernal area Pain quality: aching   Pain radiates to:  Does not radiate Pain severity:  Moderate Onset quality:  Sudden Duration:  4 hours Timing:  Constant Progression:  Unchanged Chronicity:  New Context: at rest   Relieved by:  None tried Worsened by:  Nothing Ineffective treatments:  None tried Associated symptoms: dizziness, nausea and shortness of breath   Associated symptoms: no abdominal pain, no cough and no fever        Past Medical History:  Diagnosis Date  . Alcohol abuse   . Discoid lupus   . Essential hypertension   . History of pneumonia   . PSVT (paroxysmal supraventricular tachycardia) [facility])     Patient Active Problem List   Diagnosis Date Noted  . Essential hypertension 10/11/2017  . Chest pain 10/11/2017  . 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 Tobacco abuse 10/11/2017  . Alcohol abuse 10/11/2017  . Chronic diastolic CHF (congestive heart failure) (HCC) 10/11/2017  . Pneumonia 03/22/2015  . Sepsis (HCC) 03/21/2015    Past Surgical History:  Procedure Laterality Date  . ELBOW SURGERY Left        Family History  Problem Relation Age of Onset  . Diabetes Other   . Arthritis Mother     Social History   Tobacco Use  . Smoking status: Current Every Day Smoker    Packs/day: 1.00    Years: 20.00    Pack years: 20.00    Types: Cigarettes    Start date:  09/23/1986  . Smokeless tobacco: Never Used  Substance Use Topics  . Alcohol use: Yes    Alcohol/week: 50.0 standard drinks    Types: 50 Cans of beer per week  . Drug use: No    Home Medications Prior to Admission medications   Medication Sig Start Date End Date Taking? Authorizing Provider  ibuprofen (ADVIL,MOTRIN) 600 MG tablet Take 1 tablet (600 mg total) by mouth every 6 (six) hours as needed. 04/02/18   Burgess Amor, PA-C  metoprolol tartrate (LOPRESSOR) 25 MG tablet Take 1 tablet (25 mg total) by mouth 2 (two) times daily. 12/03/18   Gilda Crease, MD    Allergies    Bee venom and Penicillins  Review of Systems   Review of Systems  Constitutional: Negative for fever.  HENT: Negative for sore throat.   Eyes: Negative for visual disturbance.  Respiratory: Positive for shortness of breath. Negative for cough.   Cardiovascular: Positive for chest pain.  Gastrointestinal: Positive for nausea. Negative for abdominal pain.  Genitourinary: Negative for dysuria.  Musculoskeletal: Negative for neck pain.  Skin: Negative for rash.  Neurological: Positive for dizziness.    Physical Exam Updated Vital Signs BP 118/86 (BP Location: Left Arm)   Pulse (!) 182   Temp (!) 97.4 F (36.3 C) (Oral)  Resp (!) 22   Ht 6\' 1"  (1.854 m)   Wt 87.5 kg   SpO2 99%   BMI 25.46 kg/m   Physical Exam Vitals and nursing note reviewed.  Constitutional:      Appearance: He is well-developed.  HENT:     Head: Normocephalic and atraumatic.  Eyes:     Conjunctiva/sclera: Conjunctivae normal.  Cardiovascular:     Rate and Rhythm: Regular rhythm. Tachycardia present.     Heart sounds: Normal heart sounds. No murmur.  Pulmonary:     Effort: Pulmonary effort is normal. No respiratory distress.     Breath sounds: Normal breath sounds.  Abdominal:     Palpations: Abdomen is soft.     Tenderness: There is no abdominal tenderness.  Musculoskeletal:        General: Normal range of motion.       Cervical back: Neck supple.     Right lower leg: No tenderness.     Left lower leg: No tenderness.  Skin:    General: Skin is warm and dry.     Capillary Refill: Capillary refill takes less than 2 seconds.  Neurological:     General: No focal deficit present.     Mental Status: He is alert.     ED Results / Procedures / Treatments   Labs (all labs ordered are listed, but only abnormal results are displayed) Labs Reviewed  BASIC METABOLIC PANEL - Abnormal; Notable for the following components:      Result Value   Sodium 134 (*)    Glucose, Bld 101 (*)    All other components within normal limits  CBC WITH DIFFERENTIAL/PLATELET - Abnormal; Notable for the following components:   Neutro Abs 1.4 (*)    All other components within normal limits  MAGNESIUM  TSH    EKG EKG Interpretation  Date/Time:  Tuesday May 21 2019 13:33:57 EST Ventricular Rate:  96 PR Interval:    QRS Duration: 90 QT Interval:  357 QTC Calculation: 452 R Axis:   87 Text Interpretation: Sinus rhythm Consider left ventricular hypertrophy improved rate and ischemic changes Confirmed by Aletta Edouard [phone]) on 05/21/2019 1:35:13 PM   Radiology No results found.  Procedures .Critical Care Performed by: Hayden Rasmussen, MD Authorized by: Hayden Rasmussen, MD   Critical care provider statement:    Critical care time (minutes):  30   Critical care time was exclusive of:  Separately billable procedures and treating other patients   Critical care was necessary to treat or prevent imminent or life-threatening deterioration of the following conditions:  Cardiac failure   Critical care was time spent personally by me on the following activities:  Evaluation of patient's response to treatment, examination of patient, ordering and performing treatments and interventions, ordering and review of laboratory studies, ordering and review of radiographic studies, pulse oximetry, re-evaluation of patient's  condition, obtaining history from patient or surrogate, review of old charts and development of treatment plan with patient or surrogate   I assumed direction of critical care for this patient from another provider in my specialty: no     (including critical care time)  Medications Ordered in ED Medications  adenosine (ADENOCARD) 6 MG/2ML injection 6 mg (0 mg Intravenous Hold 05/21/19 1339)    ED Course  I have reviewed the triage vital signs and the nursing notes.  Pertinent labs & imaging results that were available during my care of the patient were reviewed by me and considered in my  medical decision making (see chart for details).  Clinical Course as of May 20 1709  Tue May 21, 2019  1193 54 year old male here with acute onset of chest discomfort.  He is in a narrow complex tachycardia at 180 consistent with SVT.  Differential includes A. fib, a flutter, metabolic derangement.   [MB]  1343 He was placed on a cardiac monitor and pacer pads were on.  6 mg of adenosine IV push with which he converted to sinus tachycardia.  His symptoms are improving.   [MB]  1424 Patient has been observed here for about an hour and has had no further episodes of dysrhythmia or tachycardia.   [MB]    Clinical Course User Index [MB] Terrilee Files, MD   MDM Rules/Calculators/A&P                       Final Clinical Impression(s) / ED Diagnoses Final diagnoses:  SVT (supraventricular tachycardia) [facility] [place])    Rx / DC Orders ED Discharge Orders    None       Terrilee Files, MD 05/21/19 1714

## 2019-05-28 ENCOUNTER — Emergency Department (HOSPITAL_COMMUNITY)
Admission: EM | Admit: 2019-05-28 | Discharge: 2019-05-28 | Discharge status: Absent without leave | Payer: Medicaid Other

## 2019-05-28 ENCOUNTER — Other Ambulatory Visit: Payer: Self-pay

## 2019-09-16 ENCOUNTER — Other Ambulatory Visit: Payer: Self-pay

## 2019-09-16 ENCOUNTER — Emergency Department (HOSPITAL_COMMUNITY): Payer: Medicaid Other

## 2019-09-16 ENCOUNTER — Encounter (HOSPITAL_COMMUNITY): Payer: Self-pay

## 2019-09-16 ENCOUNTER — Emergency Department (HOSPITAL_COMMUNITY)
Admission: EM | Admit: 2019-09-16 | Discharge: 2019-09-16 | Disposition: A | Discharge status: Home / Self Care | Payer: Medicaid Other | Attending: Emergency Medicine | Admitting: Emergency Medicine

## 2019-09-16 DIAGNOSIS — Z79899 Other long term (current) drug therapy: Secondary | ICD-10-CM | POA: Insufficient documentation

## 2019-09-16 DIAGNOSIS — Z23 Encounter for immunization: Secondary | ICD-10-CM | POA: Insufficient documentation

## 2019-09-16 DIAGNOSIS — I10 Essential (primary) hypertension: Secondary | ICD-10-CM | POA: Diagnosis not present

## 2019-09-16 DIAGNOSIS — W5911XA Bitten by nonvenomous snake, initial encounter: Secondary | ICD-10-CM

## 2019-09-16 DIAGNOSIS — T63001A Toxic effect of unspecified snake venom, accidental (unintentional), initial encounter: Secondary | ICD-10-CM | POA: Diagnosis not present

## 2019-09-16 DIAGNOSIS — F17210 Nicotine dependence, cigarettes, uncomplicated: Secondary | ICD-10-CM | POA: Insufficient documentation

## 2019-09-16 MED ORDER — TETANUS-DIPHTH-ACELL PERTUSSIS 5-2.5-18.5 LF-MCG/0.5 IM SUSP
0.5000 mL | Freq: Once | INTRAMUSCULAR | Status: AC
  Administered 2019-09-16: 0.5 mL via INTRAMUSCULAR
  Filled 2019-09-16: qty 0.5

## 2019-09-16 NOTE — ED Provider Notes (Signed)
[facility] Provider Note   CSN: [id number] Arrival date & time: 09/16/19  1200     History Chief Complaint  Patient presents with  . Snake Bite    Jim Gordon is a 54 y.o. male present emergency department with snakebite.  He reports he was bit by a snake in his right lower leg at the house approximate 2 hours prior to arrival.  Says it was a black snake.  There was no rattling observed or heard.  He did kill a black snake in his house approximately week ago.  He reports myalgia in his right calf where he was bit.  HPI     Past Medical History:  Diagnosis Date  . Alcohol abuse   . Discoid lupus   . Essential hypertension   . History of pneumonia   . PSVT (paroxysmal supraventricular tachycardia) [facility])     Patient Active Problem List   Diagnosis Date Noted  . Essential hypertension 10/11/2017  . Chest pain 10/11/2017  . 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 Tobacco abuse 10/11/2017  . Alcohol abuse 10/11/2017  . Chronic diastolic CHF (congestive heart failure) ([place]) 10/11/2017  . Pneumonia 03/22/2015  . Sepsis ([place]) 03/21/2015    Past Surgical History:  Procedure Laterality Date  . ELBOW SURGERY Left        Family History  Problem Relation Age of Onset  . Diabetes Other   . Arthritis Mother     Social History   Tobacco Use  . Smoking status: Current Every Day Smoker    Packs/day: 1.00    Years: 20.00    Pack years: 20.00    Types: Cigarettes    Start date: 09/23/1986  . Smokeless tobacco: Never Used  Vaping Use  . Vaping Use: Never used  Substance Use Topics  . Alcohol use: Yes    Alcohol/week: 50.0 standard drinks    Types: 50 Cans of beer per week  . Drug use: Not Currently    Home Medications Prior to Admission medications   Medication Sig Start Date End Date Taking? Authorizing Provider  ibuprofen (ADVIL,MOTRIN) 600 MG tablet Take 1 tablet (600 mg total) by mouth every 6 (six) hours as needed. 04/02/18   Evalee Jefferson, PA-C    metoprolol tartrate (LOPRESSOR) 25 MG tablet Take 1 tablet (25 mg total) by mouth 2 (two) times daily. 12/03/18   Orpah Greek, MD    Allergies    Bee venom and Penicillins  Review of Systems   Review of Systems  Constitutional: Negative for chills and fever.  Eyes: Negative for pain and visual disturbance.  Respiratory: Negative for cough and shortness of breath.   Cardiovascular: Negative for chest pain and palpitations.  Gastrointestinal: Negative for abdominal pain and vomiting.  Genitourinary: Negative for dysuria and hematuria.  Musculoskeletal: Positive for arthralgias and myalgias.  Skin: Positive for wound. Negative for rash.  Neurological: Negative for weakness and numbness.  Psychiatric/Behavioral: Negative for agitation and confusion.  All other systems reviewed and are negative.   Physical Exam Updated Vital Signs BP 100/66 (BP Location: Right Arm)   Pulse 83   Temp 98.6 F (37 C) (Oral)   Resp 18   Ht 6' (1.829 m)   Wt 86.2 kg   SpO2 96%   BMI 25.77 kg/m   Physical Exam Vitals and nursing note reviewed.  Constitutional:      Appearance: He is well-developed.  HENT:     Head: Normocephalic and atraumatic.  Cardiovascular:  Rate and Rhythm: Normal rate and regular rhythm.     Pulses: Normal pulses.  Pulmonary:     Effort: Pulmonary effort is normal. No respiratory distress.     Breath sounds: Normal breath sounds.  Musculoskeletal:     Cervical back: Neck supple.     Comments: Small superficial punctate lesion on right posterior lower calf, no active bleeding, minimal tenderness, no fluctuance or skin discoloration No tenseness or fullness of the lower leg compartments  Skin:    General: Skin is warm and dry.  Neurological:     General: No focal deficit present.     Mental Status: He is alert and oriented to person, place, and time.     Sensory: No sensory deficit.     ED Results / Procedures / Treatments   Labs (all labs ordered  are listed, but only abnormal results are displayed) Labs Reviewed - No data to display  EKG None  Radiology DG Tibia/Fibula Right  Result Date: 09/16/2019 CLINICAL DATA:  Recent snake bite, possible foreign body EXAM: RIGHT TIBIA AND FIBULA - 2 VIEW COMPARISON:  None. FINDINGS: No acute fracture or dislocation is noted. No soft tissue abnormality is seen. No radiopaque foreign body is noted. IMPRESSION: No acute abnormality noted. Electronically Signed   By: Alcide Clever M.D.   On: 09/16/2019 12:54    Procedures Procedures (including critical care time)  Medications Ordered in ED Medications  Tdap (BOOSTRIX) injection 0.5 mL (0.5 mLs Intramuscular Given 09/16/19 1233)    ED Course  I have reviewed the triage vital signs and the nursing notes.  Pertinent labs & imaging results that were available during my care of the patient were reviewed by me and considered in my medical decision making (see chart for details).  54 yo male here with reported snake bite to right lower leg.  The bite site looks clean, no evidence of infection or swelling.  Xrays obtained and reviewed and showed no foreign bodies, no soft tissue swelling.  No evidence of compartment syndrome or systemic envenomation.  Tdap given here. Description of black snake highly inconsistent with venomous bite or rattlesnake.  I believe the patient is well appearing clinically and can be discharged home.  Clinical Course as of Sep 15 1821  Mon Sep 16, 2019  1220 Patient walked out prior to getting xray or tetanus shot.   [MT]    Clinical Course User Index [MT] Davon Abdelaziz, Kermit Balo, MD    Final Clinical Impression(s) / ED Diagnoses Final diagnoses:  Snake bite, initial encounter    Rx / DC Orders ED Discharge Orders    None       Renaye Rakers Kermit Balo, MD 09/16/19 [phone]

## 2019-09-16 NOTE — ED Triage Notes (Signed)
Pt reports that he was checking on his house and was bite by a black snake to right lower medial leg. Noted to have blister like area

## 2019-10-19 ENCOUNTER — Emergency Department (HOSPITAL_COMMUNITY): Payer: Medicaid Other

## 2019-10-19 ENCOUNTER — Other Ambulatory Visit: Payer: Self-pay

## 2019-10-19 ENCOUNTER — Emergency Department (HOSPITAL_COMMUNITY)
Admission: EM | Admit: 2019-10-19 | Discharge: 2019-10-20 | Disposition: A | Discharge status: Home / Self Care | Payer: Medicaid Other | Attending: Emergency Medicine | Admitting: Emergency Medicine

## 2019-10-19 ENCOUNTER — Encounter (HOSPITAL_COMMUNITY): Payer: Self-pay

## 2019-10-19 DIAGNOSIS — I110 Hypertensive heart disease with heart failure: Secondary | ICD-10-CM | POA: Diagnosis not present

## 2019-10-19 DIAGNOSIS — F10129 Alcohol abuse with intoxication, unspecified: Secondary | ICD-10-CM | POA: Insufficient documentation

## 2019-10-19 DIAGNOSIS — Y908 Blood alcohol level of 240 mg/100 ml or more: Secondary | ICD-10-CM | POA: Insufficient documentation

## 2019-10-19 DIAGNOSIS — F17210 Nicotine dependence, cigarettes, uncomplicated: Secondary | ICD-10-CM | POA: Diagnosis not present

## 2019-10-19 DIAGNOSIS — I471 Supraventricular tachycardia: Secondary | ICD-10-CM | POA: Insufficient documentation

## 2019-10-19 DIAGNOSIS — I5032 Chronic diastolic (congestive) heart failure: Secondary | ICD-10-CM | POA: Insufficient documentation

## 2019-10-19 DIAGNOSIS — R002 Palpitations: Secondary | ICD-10-CM | POA: Diagnosis present

## 2019-10-19 DIAGNOSIS — F10920 Alcohol use, unspecified with intoxication, uncomplicated: Secondary | ICD-10-CM

## 2019-10-19 LAB — COMPREHENSIVE METABOLIC PANEL
ALT: 71 U/L — ABNORMAL HIGH (ref 0–44)
AST: 104 U/L — ABNORMAL HIGH (ref 15–41)
Albumin: 4.5 g/dL (ref 3.5–5.0)
Alkaline Phosphatase: 71 U/L (ref 38–126)
Anion gap: 14 (ref 5–15)
BUN: 14 mg/dL (ref 6–20)
CO2: 20 mmol/L — ABNORMAL LOW (ref 22–32)
Calcium: 8.9 mg/dL (ref 8.9–10.3)
Chloride: 104 mmol/L (ref 98–111)
Creatinine, Ser: 0.8 mg/dL (ref 0.61–1.24)
GFR calc Af Amer: >60 mL/min (ref >60)
GFR calc non Af Amer: >60 mL/min (ref >60)
Glucose, Bld: 95 mg/dL (ref 70–99)
Potassium: 3.9 mmol/L (ref 3.5–5.1)
Sodium: 138 mmol/L (ref 135–145)
Total Bilirubin: 0.5 mg/dL (ref 0.3–1.2)
Total Protein: 8.9 g/dL — ABNORMAL HIGH (ref 6.5–8.1)

## 2019-10-19 LAB — CBC WITH DIFFERENTIAL/PLATELET
Abs Immature Granulocytes: 0 10*3/uL (ref 0.00–0.07)
Basophils Absolute: 0 10*3/uL (ref 0.0–0.1)
Basophils Relative: 1 %
Eosinophils Absolute: 0.1 10*3/uL (ref 0.0–0.5)
Eosinophils Relative: 2 %
HCT: 36.1 % — ABNORMAL LOW (ref 39.0–52.0)
Hemoglobin: 12.6 g/dL — ABNORMAL LOW (ref 13.0–17.0)
Immature Granulocytes: 0 %
Lymphocytes Relative: 54 %
Lymphs Abs: 2.3 10*3/uL (ref 0.7–4.0)
MCH: 31.1 pg (ref 26.0–34.0)
MCHC: 34.9 g/dL (ref 30.0–36.0)
MCV: 89.1 fL (ref 80.0–100.0)
Monocytes Absolute: 0.5 10*3/uL (ref 0.1–1.0)
Monocytes Relative: 12 %
Neutro Abs: 1.3 10*3/uL — ABNORMAL LOW (ref 1.7–7.7)
Neutrophils Relative %: 31 %
Platelets: 129 10*3/uL — ABNORMAL LOW (ref 150–400)
RBC: 4.05 MIL/uL — ABNORMAL LOW (ref 4.22–5.81)
RDW: 13 % (ref 11.5–15.5)
WBC: 4.3 10*3/uL (ref 4.0–10.5)
nRBC: 0 % (ref 0.0–0.2)

## 2019-10-19 LAB — MAGNESIUM: Magnesium: 2.1 mg/dL (ref 1.7–2.4)

## 2019-10-19 LAB — ETHANOL: Alcohol, Ethyl (B): 346 mg/dL (ref <10)

## 2019-10-19 MED ORDER — SODIUM CHLORIDE 0.9 % IV BOLUS
1000.0000 mL | Freq: Once | INTRAVENOUS | Status: AC
  Administered 2019-10-19: 1000 mL via INTRAVENOUS

## 2019-10-19 NOTE — ED Triage Notes (Signed)
Pt reports palpitations and feeling of high HR that started tonight after drinking four 40's of budlight. Pt says on the fifth one, he "had to get to the hospital."

## 2019-10-19 NOTE — ED Provider Notes (Signed)
[facility] EMERGENCY DEPARTMENT Provider Note   CSN: [id number] Arrival date & time: 10/19/19  2051     History Chief Complaint  Patient presents with  . Palpitations    fast HR    Jim Gordon is a 54 y.o. male.  HPI 54 year old male presents with palpitations.  Started around 5 PM tonight.  He has been drinking tonight and states he drinks daily.  Has had 4 or 5 40 ounce beers.  Smoked a pack of cigarettes today. The palpitations is a recurrent issue, had SVT in March.  No chest pain.  Maybe a little bit of transient shortness of breath but none now.  When he checked in he was in SVT and when I went to go see him, he had spontaneously converted.   Past Medical History:  Diagnosis Date  . Alcohol abuse   . Discoid lupus   . Essential hypertension   . History of pneumonia   . PSVT (paroxysmal supraventricular tachycardia) [facility])     Patient Active Problem List   Diagnosis Date Noted  . Essential hypertension 10/11/2017  . Chest pain 10/11/2017  . 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 Tobacco abuse 10/11/2017  . Alcohol abuse 10/11/2017  . Chronic diastolic CHF (congestive heart failure) (HCC) 10/11/2017  . Pneumonia 03/22/2015  . Sepsis (HCC) 03/21/2015    Past Surgical History:  Procedure Laterality Date  . ELBOW SURGERY Left        Family History  Problem Relation Age of Onset  . Diabetes Other   . Arthritis Mother     Social History   Tobacco Use  . Smoking status: Current Every Day Smoker    Packs/day: 1.00    Years: 20.00    Pack years: 20.00    Types: Cigarettes    Start date: 09/23/1986  . Smokeless tobacco: Never Used  Vaping Use  . Vaping Use: Never used  Substance Use Topics  . Alcohol use: Yes    Alcohol/week: 50.0 standard drinks    Types: 50 Cans of beer per week  . Drug use: Not Currently    Home Medications Prior to Admission medications   Medication Sig Start Date End Date Taking? Authorizing Provider  ibuprofen (ADVIL,MOTRIN) 600 MG  tablet Take 1 tablet (600 mg total) by mouth every 6 (six) hours as needed. 04/02/18   Burgess Amor, PA-C  metoprolol tartrate (LOPRESSOR) 25 MG tablet Take 1 tablet (25 mg total) by mouth 2 (two) times daily. 12/03/18   Gilda Crease, MD    Allergies    Bee venom and Penicillins  Review of Systems   Review of Systems  Constitutional: Negative for fever.  Respiratory: Positive for shortness of breath.   Cardiovascular: Positive for palpitations. Negative for chest pain.  Gastrointestinal: Negative for diarrhea and vomiting.  All other systems reviewed and are negative.   Physical Exam Updated Vital Signs BP 122/71   Pulse 82   Temp 98.2 F (36.8 C) (Oral)   Resp 16   Ht 6' (1.829 m)   Wt 86.2 kg   SpO2 92%   BMI 25.77 kg/m   Physical Exam Vitals and nursing note reviewed.  Constitutional:      General: He is not in acute distress.    Appearance: He is well-developed. He is not ill-appearing or diaphoretic.     Comments: intoxicated  HENT:     Head: Normocephalic and atraumatic.     Right Ear: External ear normal.     Left  Ear: External ear normal.     Nose: Nose normal.  Eyes:     General:        Right eye: No discharge.        Left eye: No discharge.  Cardiovascular:     Rate and Rhythm: Normal rate and regular rhythm.     Heart sounds: Normal heart sounds. No murmur heard.   Pulmonary:     Effort: Pulmonary effort is normal.     Breath sounds: Normal breath sounds.  Abdominal:     Palpations: Abdomen is soft.     Tenderness: There is no abdominal tenderness.  Musculoskeletal:     Cervical back: Neck supple.  Skin:    General: Skin is warm and dry.  Neurological:     Mental Status: He is alert.  Psychiatric:        Mood and Affect: Mood is not anxious.     ED Results / Procedures / Treatments   Labs (all labs ordered are listed, but only abnormal results are displayed) Labs Reviewed  COMPREHENSIVE METABOLIC PANEL - Abnormal; Notable for the  following components:      Result Value   CO2 20 (*)    Total Protein 8.9 (*)    AST 104 (*)    ALT 71 (*)    All other components within normal limits  ETHANOL - Abnormal; Notable for the following components:   Alcohol, Ethyl (B) 346 (*)    All other components within normal limits  CBC WITH DIFFERENTIAL/PLATELET - Abnormal; Notable for the following components:   RBC 4.05 (*)    Hemoglobin 12.6 (*)    HCT 36.1 (*)    Platelets 129 (*)    Neutro Abs 1.3 (*)    All other components within normal limits  MAGNESIUM    EKG EKG Interpretation  Date/Time:  Saturday October 19 2019 21:35:37 EDT Ventricular Rate:  94 PR Interval:    QRS Duration: 92 QT Interval:  353 QTC Calculation: 442 R Axis:   78 Text Interpretation: Sinus rhythm Borderline prolonged PR interval Left atrial enlargement Probable left ventricular hypertrophy SVT resolved Confirmed by Pricilla Loveless [phone]) on 10/19/2019 10:07:18 PM   Radiology DG Chest Portable 1 View  Result Date: 10/19/2019 CLINICAL DATA:  Cardiac palpitations following alcohol ingestion EXAM: PORTABLE CHEST 1 VIEW COMPARISON:  10/10/2017 FINDINGS: Cardiac shadow is within normal limits. The lungs are well aerated bilaterally. No focal infiltrate or sizable effusion is seen. No pneumothorax is noted. No bony abnormality is seen. IMPRESSION: No acute abnormality noted. Electronically Signed   By: Alcide Clever M.D.   On: 10/19/2019 22:15    Procedures Procedures (including critical care time)  Medications Ordered in ED Medications  sodium chloride 0.9 % bolus 1,000 mL (0 mLs Intravenous Stopped 10/19/19 2234)    ED Course  I have reviewed the triage vital signs and the nursing notes.  Pertinent labs & imaging results that were available during my care of the patient were reviewed by me and considered in my medical decision making (see chart for details).    MDM Rules/Calculators/A&P                          Patient is currently  intoxicated. His SVT resolved spontaneously. Labs reviewed and overall unremarkable besides ETOH. Has remained in NSR. He will need to sober and then can follow up with cardiology as outpatient. Care to Dr. Wilkie Aye, needs to sober.  Final Clinical Impression(s) / ED Diagnoses Final diagnoses:  Alcoholic intoxication without complication (HCC)  SVT (supraventricular tachycardia) (HCC)    Rx / DC Orders ED Discharge Orders    None       Pricilla Loveless, MD 10/20/19 [phone]

## 2019-10-19 NOTE — ED Notes (Signed)
Date and time results received: 10/19/19 10:17 PM   Test: 346 Critical Value: Alcohol  Name of Provider Notified: Dr. Criss Alvine   Orders Received? Or Actions Taken?

## 2019-10-20 NOTE — ED Provider Notes (Signed)
Patient signed out pending reassessment for clinical sobriety.  2:47 AM Patient awoken from sleep.  States he feels fine.  No further episodes of SVT or arrhythmia noted on the monitor.  Clinically he is well-appearing and not intoxicated.  Vital signs are reassuring.  Will discharge home.   Shon Baton, MD 10/20/19 [phone]

## 2019-12-08 ENCOUNTER — Emergency Department (HOSPITAL_COMMUNITY)
Admission: EM | Admit: 2019-12-08 | Discharge: 2019-12-09 | Disposition: A | Discharge status: Home / Self Care | Payer: Medicaid Other | Attending: Emergency Medicine | Admitting: Emergency Medicine

## 2019-12-08 ENCOUNTER — Other Ambulatory Visit: Payer: Self-pay

## 2019-12-08 ENCOUNTER — Encounter (HOSPITAL_COMMUNITY): Payer: Self-pay | Admitting: *Deleted

## 2019-12-08 DIAGNOSIS — I110 Hypertensive heart disease with heart failure: Secondary | ICD-10-CM | POA: Diagnosis not present

## 2019-12-08 DIAGNOSIS — R002 Palpitations: Secondary | ICD-10-CM | POA: Diagnosis not present

## 2019-12-08 DIAGNOSIS — I5032 Chronic diastolic (congestive) heart failure: Secondary | ICD-10-CM | POA: Insufficient documentation

## 2019-12-08 DIAGNOSIS — F17210 Nicotine dependence, cigarettes, uncomplicated: Secondary | ICD-10-CM | POA: Diagnosis not present

## 2019-12-08 DIAGNOSIS — I471 Supraventricular tachycardia: Secondary | ICD-10-CM

## 2019-12-08 LAB — CBC
HCT: 39.4 % (ref 39.0–52.0)
Hemoglobin: 13.1 g/dL (ref 13.0–17.0)
MCH: 30.3 pg (ref 26.0–34.0)
MCHC: 33.2 g/dL (ref 30.0–36.0)
MCV: 91 fL (ref 80.0–100.0)
Platelets: 217 10*3/uL (ref 150–400)
RBC: 4.33 MIL/uL (ref 4.22–5.81)
RDW: 13 % (ref 11.5–15.5)
WBC: 5.1 10*3/uL (ref 4.0–10.5)
nRBC: 0 % (ref 0.0–0.2)

## 2019-12-08 LAB — BASIC METABOLIC PANEL
Anion gap: 13 (ref 5–15)
BUN: 13 mg/dL (ref 6–20)
CO2: 21 mmol/L — ABNORMAL LOW (ref 22–32)
Calcium: 8.9 mg/dL (ref 8.9–10.3)
Chloride: 101 mmol/L (ref 98–111)
Creatinine, Ser: 1.03 mg/dL (ref 0.61–1.24)
GFR calc Af Amer: >60 mL/min (ref >60)
GFR calc non Af Amer: >60 mL/min (ref >60)
Glucose, Bld: 107 mg/dL — ABNORMAL HIGH (ref 70–99)
Potassium: 3.4 mmol/L — ABNORMAL LOW (ref 3.5–5.1)
Sodium: 135 mmol/L (ref 135–145)

## 2019-12-08 LAB — TROPONIN I (HIGH SENSITIVITY): Troponin I (High Sensitivity): 7 ng/L (ref <18)

## 2019-12-08 MED ORDER — DILTIAZEM HCL 25 MG/5ML IV SOLN
20.0000 mg | Freq: Once | INTRAVENOUS | Status: AC
  Administered 2019-12-08: 20 mg via INTRAVENOUS
  Filled 2019-12-08: qty 5

## 2019-12-08 NOTE — ED Triage Notes (Signed)
Pt with palpitations for past 45 min.

## 2019-12-08 NOTE — ED Notes (Signed)
Pt hooked on Zoll Monitor

## 2019-12-09 MED ORDER — METOPROLOL TARTRATE 25 MG PO TABS
25.0000 mg | ORAL_TABLET | Freq: Two times a day (BID) | ORAL | 5 refills | Status: DC
Start: 2019-12-09 — End: 2021-01-31

## 2019-12-09 NOTE — ED Provider Notes (Signed)
[facility] EMERGENCY DEPARTMENT Provider Note   CSN: [id number] Arrival date & time: 12/08/19  2216     History Chief Complaint  Patient presents with  . Palpitations    Jim Gordon is a 54 y.o. male.  The history is provided by the patient.  Palpitations Palpitations quality:  Fast Onset quality:  Sudden Duration:  1 hour Timing:  Constant Chronicity:  Recurrent Context: anxiety, caffeine and nicotine   Relieved by:  None tried Worsened by:  Nothing Ineffective treatments:  None tried Associated symptoms: no back pain, no leg pain, no lower extremity edema and no malaise/fatigue        Past Medical History:  Diagnosis Date  . Alcohol abuse   . Discoid lupus   . Essential hypertension   . History of pneumonia   . PSVT (paroxysmal supraventricular tachycardia) [facility])     Patient Active Problem List   Diagnosis Date Noted  . Essential hypertension 10/11/2017  . Chest pain 10/11/2017  . 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 Tobacco abuse 10/11/2017  . Alcohol abuse 10/11/2017  . Chronic diastolic CHF (congestive heart failure) (HCC) 10/11/2017  . Pneumonia 03/22/2015  . Sepsis (HCC) 03/21/2015    Past Surgical History:  Procedure Laterality Date  . ELBOW SURGERY Left        Family History  Problem Relation Age of Onset  . Diabetes Other   . Arthritis Mother     Social History   Tobacco Use  . Smoking status: Current Every Day Smoker    Packs/day: 1.00    Years: 20.00    Pack years: 20.00    Types: Cigarettes    Start date: 09/23/1986  . Smokeless tobacco: Never Used  Vaping Use  . Vaping Use: Never used  Substance Use Topics  . Alcohol use: Yes    Alcohol/week: 50.0 standard drinks    Types: 50 Cans of beer per week  . Drug use: Not Currently    Home Medications Prior to Admission medications   Medication Sig Start Date End Date Taking? Authorizing Provider  ibuprofen (ADVIL,MOTRIN) 600 MG tablet Take 1 tablet (600 mg total) by mouth every 6  (six) hours as needed. 04/02/18   Burgess Amor, PA-C  metoprolol tartrate (LOPRESSOR) 25 MG tablet Take 1 tablet (25 mg total) by mouth 2 (two) times daily. 12/03/18   Gilda Crease, MD    Allergies    Bee venom and Penicillins  Review of Systems   Review of Systems  Constitutional: Negative for malaise/fatigue.  Cardiovascular: Positive for palpitations.  Musculoskeletal: Negative for back pain.  All other systems reviewed and are negative.   Physical Exam Updated Vital Signs BP 116/78   Pulse 68   Temp 98.2 F (36.8 C) (Oral)   Resp 13   Ht 6' 1.5" (1.867 m)   Wt 83 kg   SpO2 94%   BMI 23.82 kg/m   Physical Exam Vitals and nursing note reviewed.  Constitutional:      Appearance: He is well-developed.  HENT:     Head: Normocephalic and atraumatic.     Nose: No congestion or rhinorrhea.     Mouth/Throat:     Mouth: Mucous membranes are moist.     Pharynx: Oropharynx is clear.  Eyes:     Pupils: Pupils are equal, round, and reactive to light.  Cardiovascular:     Rate and Rhythm: Regular rhythm. Tachycardia present.  Pulmonary:     Effort: Pulmonary effort is normal. No respiratory  distress.  Abdominal:     General: Abdomen is flat. There is no distension.  Musculoskeletal:        General: Normal range of motion.     Cervical back: Normal range of motion.  Skin:    General: Skin is warm and dry.  Neurological:     General: No focal deficit present.     Mental Status: He is alert.     ED Results / Procedures / Treatments   Labs (all labs ordered are listed, but only abnormal results are displayed) Labs Reviewed  BASIC METABOLIC PANEL - Abnormal; Notable for the following components:      Result Value   Potassium 3.4 (*)    CO2 21 (*)    Glucose, Bld 107 (*)    All other components within normal limits  CBC  TROPONIN I (HIGH SENSITIVITY)    EKG EKG Interpretation  Date/Time:  Sunday December 08 2019 22:22:22 EDT Ventricular Rate:  168 PR  Interval:    QRS Duration: 94 QT Interval:  286 QTC Calculation: 478 R Axis:   74 Text Interpretation: Supraventricular tachycardia ST & T wave abnormality, consider inferior ischemia Abnormal ECG Confirmed by Marily Memos [phone]) on 12/09/2019 12:22:46 AM   EKG Interpretation  Date/Time:  Sunday December 08 2019 22:22:22 EDT Ventricular Rate:  168 PR Interval:    QRS Duration: 94 QT Interval:  286 QTC Calculation: 478 R Axis:   74 Text Interpretation: Supraventricular tachycardia ST & T wave abnormality, consider inferior ischemia Abnormal ECG Confirmed by Marily Memos [phone]) on 12/09/2019 12:22:46 AM        Radiology No results found.  Procedures   Counseled patient for approximately 7 minutes regarding smoking cessation. Discussed risks of smoking and how they applied and affected their visit here today. Patient not ready to quit at this time, however will follow up with their primary doctor when they are.   CPT code: 62947: intermediate counseling for smoking cessation   .Critical Care Performed by: Marily Memos, MD Authorized by: Marily Memos, MD   Critical care provider statement:    Critical care time (minutes):  45   Critical care was necessary to treat or prevent imminent or life-threatening deterioration of the following conditions:  Circulatory failure and cardiac failure   Critical care was time spent personally by me on the following activities:  Discussions with consultants, evaluation of patient's response to treatment, examination of patient, ordering and performing treatments and interventions, ordering and review of laboratory studies, ordering and review of radiographic studies, pulse oximetry, re-evaluation of patient's condition, obtaining history from patient or surrogate and review of old charts   (including critical care time)  Medications Ordered in ED Medications  diltiazem (CARDIZEM) injection 20 mg (20 mg Intravenous Given 12/08/19 2323)     ED Course  I have reviewed the triage vital signs and the nursing notes.  Pertinent labs & imaging results that were available during my care of the patient were reviewed by me and considered in my medical decision making (see chart for details).    MDM Rules/Calculators/A&P                         Diltiazem converted. Will try to reduce alcohol and nicotine. Again.   Converted with diltiazem.  Patient was observed for about another hour without any return any tachycardia.  Patient ambulated without any tachycardia or other symptoms.  I discussed with him modifying his risk factors.  To include smoking cessation.  He will attempt to do so cold [place].  Will follow up with cardiology.  I re-prescribed his prescription for metoprolol as it does not seem that he actually filled it.  Final Clinical Impression(s) / ED Diagnoses Final diagnoses:  None    Rx / DC Orders ED Discharge Orders    None       Charlita Brian, Barbara Cower, MD 12/09/19 [phone]

## 2019-12-23 ENCOUNTER — Emergency Department (HOSPITAL_COMMUNITY)
Admission: EM | Admit: 2019-12-23 | Discharge: 2019-12-23 | Discharge status: Against Medical Advice | Payer: Medicaid Other

## 2019-12-23 ENCOUNTER — Other Ambulatory Visit: Payer: Self-pay

## 2020-01-10 ENCOUNTER — Emergency Department (HOSPITAL_COMMUNITY)
Admission: EM | Admit: 2020-01-10 | Discharge: 2020-01-10 | Disposition: A | Discharge status: Home / Self Care | Payer: Medicaid Other | Attending: Emergency Medicine | Admitting: Emergency Medicine

## 2020-01-10 ENCOUNTER — Other Ambulatory Visit: Payer: Self-pay

## 2020-01-10 ENCOUNTER — Encounter (HOSPITAL_COMMUNITY): Payer: Self-pay | Admitting: Emergency Medicine

## 2020-01-10 DIAGNOSIS — N644 Mastodynia: Secondary | ICD-10-CM | POA: Diagnosis not present

## 2020-01-10 DIAGNOSIS — Z79899 Other long term (current) drug therapy: Secondary | ICD-10-CM | POA: Insufficient documentation

## 2020-01-10 DIAGNOSIS — F17210 Nicotine dependence, cigarettes, uncomplicated: Secondary | ICD-10-CM | POA: Insufficient documentation

## 2020-01-10 DIAGNOSIS — N61 Mastitis without abscess: Secondary | ICD-10-CM

## 2020-01-10 DIAGNOSIS — I110 Hypertensive heart disease with heart failure: Secondary | ICD-10-CM | POA: Insufficient documentation

## 2020-01-10 DIAGNOSIS — I5032 Chronic diastolic (congestive) heart failure: Secondary | ICD-10-CM | POA: Insufficient documentation

## 2020-01-10 MED ORDER — DOXYCYCLINE HYCLATE 100 MG PO CAPS: 100.0000 mg | ORAL_CAPSULE | Freq: Two times a day (BID) | ORAL | 0 refills | Status: DC

## 2020-01-10 MED ORDER — DOXYCYCLINE HYCLATE 100 MG PO TABS
100.0000 mg | ORAL_TABLET | Freq: Once | ORAL | Status: AC
  Administered 2020-01-10: 100 mg via ORAL
  Filled 2020-01-10: qty 1

## 2020-01-10 NOTE — Discharge Instructions (Signed)
Take doxycycline twice a day, warm compresses or hot shower 20 minutes/day, gently express the pus from the nipple.  Return to the ER for severe worsening pain swelling or fever otherwise see your doctor in 1 week

## 2020-01-10 NOTE — ED Triage Notes (Signed)
Pt states he had his nipples pierced about 5 days ago and now the left one is swollen and red. Pt concerned for infection.

## 2020-01-10 NOTE — ED Provider Notes (Signed)
[facility] EMERGENCY DEPARTMENT Provider Note   CSN: [id number] Arrival date & time: 01/10/20  1758     History Chief Complaint  Patient presents with   Wound Infection    Jim Gordon is a 54 y.o. male.  HPI   54 year old male recently repierced his left nipple, states that the piercing came out but he has continued to bump the nipple while he was intoxicated watching football on a door jam.  It has caused some pain and swelling no associated fever, symptoms are persistent, mild, gradually worsening.  He is now having some drainage from the nipple  Past Medical History:  Diagnosis Date   Alcohol abuse    Discoid lupus    Essential hypertension    History of pneumonia    PSVT (paroxysmal supraventricular tachycardia) [facility])     Patient Active Problem List   Diagnosis Date Noted   Essential hypertension 10/11/2017   Chest pain 10/11/2017   Discoid lupus 10/11/2017   Tobacco abuse 10/11/2017   Alcohol abuse 10/11/2017   Chronic diastolic CHF (congestive heart failure) (HCC) 10/11/2017   Pneumonia 03/22/2015   Sepsis (HCC) 03/21/2015    Past Surgical History:  Procedure Laterality Date   ELBOW SURGERY Left        Family History  Problem Relation Age of Onset   Diabetes Other    Arthritis Mother     Social History   Tobacco Use   Smoking status: Current Every Day Smoker    Packs/day: 1.00    Years: 20.00    Pack years: 20.00    Types: Cigarettes    Start date: 09/23/1986   Smokeless tobacco: Never Used  Vaping Use   Vaping Use: Never used  Substance Use Topics   Alcohol use: Yes    Alcohol/week: 50.0 standard drinks    Types: 50 Cans of beer per week   Drug use: Not Currently    Home Medications Prior to Admission medications   Medication Sig Start Date End Date Taking? Authorizing Provider  ibuprofen (ADVIL,MOTRIN) 600 MG tablet Take 1 tablet (600 mg total) by mouth every 6 (six) hours as needed. 04/02/18   Burgess Amor,  PA-C  metoprolol tartrate (LOPRESSOR) 25 MG tablet Take 1 tablet (25 mg total) by mouth 2 (two) times daily. 12/09/19   Mesner, Barbara Cower, MD    Allergies    Bee venom and Penicillins  Review of Systems   Review of Systems  Constitutional: Negative for fever.  Skin: Positive for rash.    Physical Exam Updated Vital Signs BP (!) 149/98 (BP Location: Right Arm)    Pulse 67    Temp 98 F (36.7 C) (Oral)    Resp 20    Ht 1.867 m (6' 1.5")    Wt 83.9 kg    SpO2 98%    BMI 24.08 kg/m   Physical Exam Vitals and nursing note reviewed.  Constitutional:      Appearance: He is well-developed. He is not diaphoretic.  HENT:     Head: Normocephalic and atraumatic.  Eyes:     General:        Right eye: No discharge.        Left eye: No discharge.     Conjunctiva/sclera: Conjunctivae normal.  Pulmonary:     Effort: Pulmonary effort is normal. No respiratory distress.  Skin:    General: Skin is warm and dry.     Findings: No erythema or rash.     Comments: Left nipple  with mild swelling redness and tenderness and purulent drainage, subcentimeter  Neurological:     Mental Status: He is alert.     Coordination: Coordination normal.     ED Results / Procedures / Treatments   Labs (all labs ordered are listed, but only abnormal results are displayed) Labs Reviewed - No data to display  EKG None  Radiology No results found.  Procedures Procedures (including critical care time)  Medications Ordered in ED Medications  doxycycline (VIBRA-TABS) tablet 100 mg (has no administration in time range)    ED Course  I have reviewed the triage vital signs and the nursing notes.  Pertinent labs & imaging results that were available during my care of the patient were reviewed by me and considered in my medical decision making (see chart for details).    MDM Rules/Calculators/A&P                          Skin and soft tissue infection, doxycycline, warm compresses, patient agreeable,  vital signs reviewed, mild hypertension, no fever.  Final Clinical Impression(s) / ED Diagnoses Final diagnoses:  Infected pierced nipple    Rx / DC Orders ED Discharge Orders    None       Eber Hong, MD 01/10/20 2033

## 2020-05-26 ENCOUNTER — Emergency Department (HOSPITAL_COMMUNITY)
Admission: EM | Admit: 2020-05-26 | Discharge: 2020-05-26 | Disposition: A | Discharge status: Home / Self Care | Payer: Medicaid Other | Attending: Emergency Medicine | Admitting: Emergency Medicine

## 2020-05-26 ENCOUNTER — Other Ambulatory Visit: Payer: Self-pay

## 2020-05-26 ENCOUNTER — Encounter (HOSPITAL_COMMUNITY): Payer: Self-pay | Admitting: *Deleted

## 2020-05-26 DIAGNOSIS — K047 Periapical abscess without sinus: Secondary | ICD-10-CM | POA: Insufficient documentation

## 2020-05-26 DIAGNOSIS — F17210 Nicotine dependence, cigarettes, uncomplicated: Secondary | ICD-10-CM | POA: Diagnosis not present

## 2020-05-26 DIAGNOSIS — Z79899 Other long term (current) drug therapy: Secondary | ICD-10-CM | POA: Diagnosis not present

## 2020-05-26 DIAGNOSIS — I5032 Chronic diastolic (congestive) heart failure: Secondary | ICD-10-CM | POA: Diagnosis not present

## 2020-05-26 DIAGNOSIS — R220 Localized swelling, mass and lump, head: Secondary | ICD-10-CM | POA: Diagnosis present

## 2020-05-26 DIAGNOSIS — I110 Hypertensive heart disease with heart failure: Secondary | ICD-10-CM | POA: Diagnosis not present

## 2020-05-26 MED ORDER — CLINDAMYCIN HCL 150 MG PO CAPS
300.0000 mg | ORAL_CAPSULE | Freq: Once | ORAL | Status: AC
  Administered 2020-05-26: 300 mg via ORAL
  Filled 2020-05-26: qty 2

## 2020-05-26 MED ORDER — CLINDAMYCIN HCL 300 MG PO CAPS: 300.0000 mg | ORAL_CAPSULE | Freq: Three times a day (TID) | ORAL | 0 refills | Status: AC

## 2020-05-26 NOTE — ED Provider Notes (Signed)
[facility] EMERGENCY DEPARTMENT Provider Note   CSN: [id number] Arrival date & time: 05/26/20  1640     History Chief Complaint  Patient presents with  . Facial Swelling    Jim Gordon is a 55 y.o. male.  The history is provided by the patient. No language interpreter was used.  Dental Pain Location:  Upper Upper teeth location:  3/RU 1st molar Quality:  Aching Severity:  Moderate Onset quality:  Gradual Timing:  Constant Progression:  Worsening Chronicity:  New Previous work-up:  Dental exam Relieved by:  Nothing Worsened by:  Nothing Associated symptoms: facial pain        Past Medical History:  Diagnosis Date  . Alcohol abuse   . Discoid lupus   . Essential hypertension   . History of pneumonia   . PSVT (paroxysmal supraventricular tachycardia) [facility])     Patient Active Problem List   Diagnosis Date Noted  . Essential hypertension 10/11/2017  . Chest pain 10/11/2017  . 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 Tobacco abuse 10/11/2017  . Alcohol abuse 10/11/2017  . Chronic diastolic CHF (congestive heart failure) (HCC) 10/11/2017  . Pneumonia 03/22/2015  . Sepsis (HCC) 03/21/2015    Past Surgical History:  Procedure Laterality Date  . ELBOW SURGERY Left        Family History  Problem Relation Age of Onset  . Diabetes Other   . Arthritis Mother     Social History   Tobacco Use  . Smoking status: Current Every Day Smoker    Packs/day: 1.00    Years: 20.00    Pack years: 20.00    Types: Cigarettes    Start date: 09/23/1986  . Smokeless tobacco: Never Used  Vaping Use  . Vaping Use: Never used  Substance Use Topics  . Alcohol use: Yes    Alcohol/week: 50.0 standard drinks    Types: 50 Cans of beer per week  . Drug use: Not Currently    Home Medications Prior to Admission medications   Medication Sig Start Date End Date Taking? Authorizing Provider  clindamycin (CLEOCIN) 300 MG capsule Take 1 capsule (300 mg total) by mouth 3 (three) times  daily for 10 days. 05/26/20 06/05/20 Yes Elson Areas, PA-C  doxycycline (VIBRAMYCIN) 100 MG capsule Take 1 capsule (100 mg total) by mouth 2 (two) times daily. 01/10/20   Eber Hong, MD  ibuprofen (ADVIL,MOTRIN) 600 MG tablet Take 1 tablet (600 mg total) by mouth every 6 (six) hours as needed. 04/02/18   Burgess Amor, PA-C  metoprolol tartrate (LOPRESSOR) 25 MG tablet Take 1 tablet (25 mg total) by mouth 2 (two) times daily. 12/09/19   Mesner, Barbara Cower, MD    Allergies    Bee venom and Penicillins  Review of Systems   Review of Systems  All other systems reviewed and are negative.   Physical Exam Updated Vital Signs BP 122/80   Pulse 96   Temp 98.1 F (36.7 C) (Oral)   Resp 20   SpO2 96%   Physical Exam Vitals and nursing note reviewed.  Constitutional:      Appearance: He is well-developed and well-nourished.  HENT:     Head: Normocephalic.     Mouth/Throat:     Mouth: Mucous membranes are moist.     Comments: Severe dental decay, swelling right upper gumline  Eyes:     Conjunctiva/sclera: Conjunctivae normal.  Cardiovascular:     Rate and Rhythm: Normal rate and regular rhythm.     Heart sounds:  No murmur heard.   Pulmonary:     Effort: Pulmonary effort is normal. No respiratory distress.     Breath sounds: Normal breath sounds.  Abdominal:     Palpations: Abdomen is soft.     Tenderness: There is no abdominal tenderness.  Musculoskeletal:        General: No edema.     Cervical back: Neck supple.  Skin:    General: Skin is warm and dry.  Neurological:     Mental Status: He is alert.  Psychiatric:        Mood and Affect: Mood and affect normal.     ED Results / Procedures / Treatments   Labs (all labs ordered are listed, but only abnormal results are displayed) Labs Reviewed - No data to display  EKG None  Radiology No results found.  Procedures Procedures   Medications Ordered in ED Medications  clindamycin (CLEOCIN) capsule 300 mg (has no  administration in time range)    ED Course  I have reviewed the triage vital signs and the nursing notes.  Pertinent labs & imaging results that were available during my care of the patient were reviewed by me and considered in my medical decision making (see chart for details).    MDM Rules/Calculators/A&P                          MDM:  Pt given dental resources,  Rx for clindamycin Final Clinical Impression(s) / ED Diagnoses Final diagnoses:  Dental infection    Rx / DC Orders ED Discharge Orders         Ordered    clindamycin (CLEOCIN) 300 MG capsule  3 times daily        05/26/20 1729        An After Visit Summary was printed and given to the patient.    Elson Areas, [place] 05/26/20 1733    Mancel Bale, MD 06/01/20 [phone]

## 2020-05-26 NOTE — Discharge Instructions (Addendum)
Schedule to see the dentist for evaluation °

## 2020-05-26 NOTE — ED Triage Notes (Signed)
Facial swelling with headache, has many dental caries

## 2021-01-31 ENCOUNTER — Emergency Department (HOSPITAL_COMMUNITY)
Admission: EM | Admit: 2021-01-31 | Discharge: 2021-01-31 | Disposition: A | Discharge status: Home / Self Care | Payer: Medicaid Other | Attending: Emergency Medicine | Admitting: Emergency Medicine

## 2021-01-31 ENCOUNTER — Emergency Department (HOSPITAL_COMMUNITY): Payer: Medicaid Other

## 2021-01-31 ENCOUNTER — Other Ambulatory Visit: Payer: Self-pay

## 2021-01-31 DIAGNOSIS — I471 Supraventricular tachycardia: Secondary | ICD-10-CM | POA: Insufficient documentation

## 2021-01-31 DIAGNOSIS — F17210 Nicotine dependence, cigarettes, uncomplicated: Secondary | ICD-10-CM | POA: Insufficient documentation

## 2021-01-31 DIAGNOSIS — I5032 Chronic diastolic (congestive) heart failure: Secondary | ICD-10-CM | POA: Insufficient documentation

## 2021-01-31 DIAGNOSIS — I110 Hypertensive heart disease with heart failure: Secondary | ICD-10-CM | POA: Diagnosis not present

## 2021-01-31 DIAGNOSIS — R002 Palpitations: Secondary | ICD-10-CM | POA: Diagnosis present

## 2021-01-31 LAB — CBC WITH DIFFERENTIAL/PLATELET
Abs Immature Granulocytes: 0.01 10*3/uL (ref 0.00–0.07)
Basophils Absolute: 0 10*3/uL (ref 0.0–0.1)
Basophils Relative: 1 %
Eosinophils Absolute: 0.2 10*3/uL (ref 0.0–0.5)
Eosinophils Relative: 3 %
HCT: 41.3 % (ref 39.0–52.0)
Hemoglobin: 14 g/dL (ref 13.0–17.0)
Immature Granulocytes: 0 %
Lymphocytes Relative: 61 %
Lymphs Abs: 3.5 10*3/uL (ref 0.7–4.0)
MCH: 30.4 pg (ref 26.0–34.0)
MCHC: 33.9 g/dL (ref 30.0–36.0)
MCV: 89.6 fL (ref 80.0–100.0)
Monocytes Absolute: 0.4 10*3/uL (ref 0.1–1.0)
Monocytes Relative: 6 %
Neutro Abs: 1.6 10*3/uL — ABNORMAL LOW (ref 1.7–7.7)
Neutrophils Relative %: 29 %
Platelets: 168 10*3/uL (ref 150–400)
RBC: 4.61 MIL/uL (ref 4.22–5.81)
RDW: 12.2 % (ref 11.5–15.5)
WBC: 5.6 10*3/uL (ref 4.0–10.5)
nRBC: 0 % (ref 0.0–0.2)

## 2021-01-31 LAB — BASIC METABOLIC PANEL
Anion gap: 10 (ref 5–15)
BUN: 9 mg/dL (ref 6–20)
CO2: 21 mmol/L — ABNORMAL LOW (ref 22–32)
Calcium: 8.6 mg/dL — ABNORMAL LOW (ref 8.9–10.3)
Chloride: 106 mmol/L (ref 98–111)
Creatinine, Ser: 0.74 mg/dL (ref 0.61–1.24)
GFR, Estimated: >60 mL/min (ref >60)
Glucose, Bld: 88 mg/dL (ref 70–99)
Potassium: 3.6 mmol/L (ref 3.5–5.1)
Sodium: 137 mmol/L (ref 135–145)

## 2021-01-31 MED ORDER — METOPROLOL TARTRATE 25 MG PO TABS
25.0000 mg | ORAL_TABLET | Freq: Once | ORAL | Status: AC
  Administered 2021-01-31: 25 mg via ORAL
  Filled 2021-01-31: qty 1

## 2021-01-31 MED ORDER — ADENOSINE 6 MG/2ML IV SOLN: 6.0000 mg | Freq: Once | INTRAVENOUS | Status: DC

## 2021-01-31 MED ORDER — SODIUM CHLORIDE 0.9 % IV BOLUS
1000.0000 mL | Freq: Once | INTRAVENOUS | Status: AC
  Administered 2021-01-31: 1000 mL via INTRAVENOUS

## 2021-01-31 MED ORDER — ADENOSINE 6 MG/2ML IV SOLN
12.0000 mg | Freq: Once | INTRAVENOUS | Status: AC
Start: 2021-01-31 — End: 2021-01-31
  Administered 2021-01-31: 12 mg via INTRAVENOUS

## 2021-01-31 MED ORDER — METOPROLOL TARTRATE 25 MG PO TABS: 25.0000 mg | ORAL_TABLET | Freq: Two times a day (BID) | ORAL | 0 refills | Status: DC

## 2021-01-31 NOTE — ED Provider Notes (Signed)
Medical screening examination/treatment/procedure(s) were conducted as a shared visit with non-physician practitioner(s) and myself.  I personally evaluated the patient during the encounter.  Clinical Impression:   Final diagnoses:  SVT (supraventricular tachycardia) [facility])    This patient is a 55 year old male, reports a history of hypertension for which she has been prescribed lisinopril and states that he takes it however he has not had it for the last month.  He also reports a history of irregular heartbeats and review of the medical record shows that he does have somewhat frequent SVT episodes.  He has been prescribed beta-blockers in the past but does not take them.  He states that tonight while he was watching the football game he had acute onset of chest pain and palpitations and arrives in what appears to be SVT with a regular narrow complex tachycardia at 165 bpm, on exam the patient has good pulses, clear heart and lung sounds, no edema, he is awake and alert with a normal mental status.  After consent was obtained the patient was placed in the semirecumbent position and an IV was placed in his right upper extremity just above the antecubital fossa.  12 mg of adenosine was pushed while the patient was on the monitor with successful conversion of the patient into a mild sinus tachycardia down to a rate of 110 bpm and gradually slowing back to a normal rhythm.  He is asymptomatic at this time.   EKG Interpretation  Date/Time:  Sunday January 31 2021 21:20:35 EST Ventricular Rate:  165 PR Interval:  36 QRS Duration: 91 QT Interval:  280 QTC Calculation: 464 R Axis:   85 Text Interpretation: Supraventricular tachycardia Anteroseptal infarct, old Repolarization abnormality, prob rate related since last tracing no significant change hx of SVT present Confirmed by Eber Hong ([id number]) on 01/31/2021 9:25:26 PM        EKG Interpretation  Date/Time:  Sunday January 31 2021 22:13:17  EST Ventricular Rate:  79 PR Interval:  207 QRS Duration: 88 QT Interval:  410 QTC Calculation: 470 R Axis:   79 Text Interpretation: Sinus rhythm Borderline prolonged PR interval Probable left atrial enlargement Abnormal R-wave progression, early transition SVT resolved Otherwise no significant change from prior tracings Confirmed by Drema Pry [phone]) on 02/01/2021 2:29:33 PM       With a level of tachycardia this patient surely would have gone into congestive heart failure had this persisted, it was required that a critical intervention of adenosine was used to stop this lethal arrhythmia     Eber Hong, MD 02/02/21 [phone]

## 2021-01-31 NOTE — ED Provider Notes (Signed)
[facility] EMERGENCY DEPARTMENT Provider Note   CSN: [id number] Arrival date & time: 01/31/21  2105     History Chief Complaint  Patient presents with   Tachycardia   Chest Pain    Jim Gordon is a 55 y.o. male.  HPI  Patient with significant medical history of alcohol use, lupus, hypertension, SVT presents with chief complaint of heart palpitations.  started about 1 hour ago, states he was watching TV and his heart started to race,  felt short of breath, but denies becoming diaphoretic, having chest pain, nausea, vomiting, lightheaded or dizziness.  He states the heart palpitations have since resolved but  has slight chest soreness on the left side, does not radiate, denies alleviating or aggravating factors.  He has no other complaints at this time.  He does not endorse fevers, chills, nasal ingestion sore throat, cough, denies any leg pain, calf pain, has no history of PEs or DVTs, currently not on hormone therapy.  He denies  illicit drug use, he has no significant cardiac history.  After reviewing patient's chart patient seen here multiple times for SVT, he has been given Cardizem in the past and will convert spontaneously on its own.  Most recent was back in February.  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   Tobacco abuse 10/11/2017   Alcohol abuse 10/11/2017   Chronic diastolic CHF (congestive heart failure) (HCC) 10/11/2017   Pneumonia 03/22/2015   Sepsis (HCC) 03/21/2015    Past Surgical History:  Procedure Laterality Date   ELBOW SURGERY Left        Family History  Problem Relation Age of Onset   Diabetes Other    Arthritis Mother     Social History   Tobacco Use    Smoking status: Every Day    Packs/day: 1.00    Years: 20.00    Pack years: 20.00    Types: Cigarettes    Start date: 09/23/1986   Smokeless tobacco: Never  Vaping Use   Vaping Use: Never used  Substance Use Topics   Alcohol use: Yes    Alcohol/week: 50.0 standard drinks    Types: 50 Cans of beer per week   Drug use: Not Currently    Home Medications Prior to Admission medications   Medication Sig Start Date End Date Taking? Authorizing Provider  metoprolol tartrate (LOPRESSOR) 25 MG tablet Take 1 tablet (25 mg total) by mouth 2 (two) times daily. 01/31/21 03/02/21 Yes Carroll Sage, PA-C  doxycycline (VIBRAMYCIN) 100 MG capsule Take 1 capsule (100 mg total) by mouth 2 (two) times daily. 01/10/20   Eber Hong, MD  ibuprofen (ADVIL,MOTRIN) 600 MG tablet Take 1 tablet (600 mg total) by mouth every 6 (six) hours as needed. 04/02/18   Burgess Amor, PA-C    Allergies    Bee venom and Penicillins  Review of Systems   Review of Systems  Constitutional:  Negative for chills and fever.  HENT:  Negative for congestion.   Respiratory:  Negative for shortness of breath.   Cardiovascular:  Positive for chest pain and palpitations.  Gastrointestinal:  Negative for abdominal pain, diarrhea, nausea and vomiting.  Genitourinary:  Negative for  enuresis.  Musculoskeletal:  Negative for back pain.  Skin:  Negative for rash.  Neurological:  Negative for dizziness.  Hematological:  Does not bruise/bleed easily.   Physical Exam Updated Vital Signs BP (!) 134/101   Pulse 77   Temp 98 F (36.7 C) (Oral)   Resp 15   Ht 6' 1.5" (1.867 m)   Wt 83 kg   SpO2 97%   BMI 23.82 kg/m   Physical Exam Vitals and nursing note reviewed.  Constitutional:      General: He is not in acute distress.    Appearance: He is not ill-appearing.  HENT:     Head: Normocephalic and atraumatic.     Nose: No congestion.  Eyes:     Conjunctiva/sclera: Conjunctivae normal.  Cardiovascular:     Rate and  Rhythm: Regular rhythm. Tachycardia present.     Pulses: Normal pulses.     Heart sounds: No murmur heard.   No friction rub. No gallop.  Pulmonary:     Effort: No respiratory distress.     Breath sounds: No wheezing, rhonchi or rales.  Abdominal:     Palpations: Abdomen is soft.     Tenderness: There is no abdominal tenderness. There is no right CVA tenderness or left CVA tenderness.  Musculoskeletal:     Right lower leg: No edema.     Left lower leg: No edema.  Skin:    General: Skin is warm and dry.  Neurological:     Mental Status: He is alert.  Psychiatric:        Mood and Affect: Mood normal.    ED Results / Procedures / Treatments   Labs (all labs ordered are listed, but only abnormal results are displayed) Labs Reviewed  CBC WITH DIFFERENTIAL/PLATELET - Abnormal; Notable for the following components:      Result Value   Neutro Abs 1.6 (*)    All other components within normal limits  BASIC METABOLIC PANEL - Abnormal; Notable for the following components:   CO2 21 (*)    Calcium 8.6 (*)    All other components within normal limits    EKG EKG Interpretation  Date/Time:  Sunday January 31 2021 21:20:35 EST Ventricular Rate:  165 PR Interval:  36 QRS Duration: 91 QT Interval:  280 QTC Calculation: 464 R Axis:   85 Text Interpretation: Supraventricular tachycardia Anteroseptal infarct, old Repolarization abnormality, prob rate related since last tracing no significant change hx of SVT present Confirmed by Noemi Chapel [id number]) on 01/31/2021 9:25:26 PM  Radiology DG Chest Portable 1 View  Result Date: 01/31/2021 CLINICAL DATA:  Palpitations, tachycardia, chest pain EXAM: PORTABLE CHEST 1 VIEW COMPARISON:  10/19/2019 FINDINGS: The heart size and mediastinal contours are within normal limits. Both lungs are clear. Stable right pleural thickening at the costophrenic angle. No pneumothorax or pleural effusion. The visualized skeletal structures are unremarkable.  IMPRESSION: No active disease. Electronically Signed   By: Fidela Salisbury M.D.   On: 01/31/2021 22:07    Procedures .Critical Care Performed by: Marcello Fennel, PA-C Authorized by: Marcello Fennel, PA-C   Critical care provider statement:    Critical care time (minutes):  30   Critical care time was exclusive of:  Separately billable procedures and treating other patients   Critical care was necessary to treat or prevent imminent or life-threatening deterioration of the following conditions:  Cardiac failure (SVT, unstable rhythm, chemical cardioversion was used.)   Critical care was time spent personally by me on  the following activities:  Ordering and performing treatments and interventions, ordering and review of laboratory studies, ordering and review of radiographic studies, pulse oximetry, re-evaluation of patient's condition, review of old charts, examination of patient and evaluation of patient's response to treatment   I assumed direction of critical care for this patient from another provider in my specialty: no     Medications Ordered in ED Medications  sodium chloride 0.9 % bolus 1,000 mL (0 mLs Intravenous Stopped 01/31/21 2234)  adenosine (ADENOCARD) 6 MG/2ML injection 12 mg (12 mg Intravenous Given 01/31/21 2201)  metoprolol tartrate (LOPRESSOR) tablet 25 mg (25 mg Oral Given 01/31/21 2213)    ED Course  I have reviewed the triage vital signs and the nursing notes.  Pertinent labs & imaging results that were available during my care of the patient were reviewed by me and considered in my medical decision making (see chart for details).    MDM Rules/Calculators/A&P                          Initial impression-presents with heart palpitations.  He is alert, does not appear acute chest, vital signs normal for pulse rate of 170.  EKG reveals SVT, will obtain basic lab work-up, chest x-ray, provide him with fluids, recommend adenosine for conversion.  Work-up-CBC  unremarkable, BMP unremarkable.  Chest x-ray unremarkable.  EKG shows SVT without signs of ischemia.  Reassessment-recommended cardioversion as he is in SVT, risks and benefits were discussed and were not limited to arrhythmias, systole, pain, treatment failure, all questions were answered, patient agreed in this plan.  While respiratory therapy was present with suction and nonrebreather at hand, crash cart with pads in place adenosine was given patient converted to sinus rhythm tolerated procedure well.  will continue to monitor the patient.  Rule out-low suspicion for systemic infection as patient nontoxic-appearing, vital signs are reassuring.  He was noted to be tachycardic on arrival this is like secondary to SVT, he is converted back to normal sinus rhythm.  I have low suspicion for ACS as EKG without signs of ischemia, previous adenosine and post adenosine.  I have low suspicion for PE as presentation atypical of etiology.  Plan-  SVT-converted back into sinus rhythm, will have him continue with metoprolol, follow-up with cardiology for further evaluation.  Given strict return precautions.  Vital signs have remained stable, no indication for hospital admission.  Patient discussed with attending and they agreed with assessment and plan.  Patient given at home care as well strict return precautions.  Patient verbalized that they understood agreed to said plan.  Final Clinical Impression(s) / ED Diagnoses Final diagnoses:  SVT (supraventricular tachycardia) ([place])    Rx / DC Orders ED Discharge Orders          Ordered    metoprolol tartrate (LOPRESSOR) 25 MG tablet  2 times daily        01/31/21 2257             Aron Baba 01/31/21 2259    Noemi Chapel, MD 02/02/21 [phone]

## 2021-01-31 NOTE — ED Triage Notes (Signed)
Pt reports he has been out of his blood pressure medicines for one month.

## 2021-01-31 NOTE — ED Triage Notes (Signed)
Pt reports rapid heart rate that started 30 minutes prior to arrival.

## 2021-01-31 NOTE — Discharge Instructions (Addendum)
Started you back on your medications please take as prescribed.  Recommend avoiding caffeine consumption, alcohol use, staying hydrated will help decrease incidence of SVT  Please follow-up your PCP and or cardiology for further evaluation.  Come back to the emergency department if you develop chest pain, shortness of breath, severe abdominal pain, uncontrolled nausea, vomiting, diarrhea.

## 2021-02-02 ENCOUNTER — Encounter (HOSPITAL_COMMUNITY): Payer: Self-pay | Admitting: Emergency Medicine

## 2021-02-02 ENCOUNTER — Emergency Department (HOSPITAL_COMMUNITY): Payer: Medicaid Other

## 2021-02-02 ENCOUNTER — Emergency Department (HOSPITAL_COMMUNITY)
Admission: EM | Admit: 2021-02-02 | Discharge: 2021-02-03 | Disposition: A | Discharge status: Home / Self Care | Payer: Medicaid Other | Attending: Emergency Medicine | Admitting: Emergency Medicine

## 2021-02-02 DIAGNOSIS — F17210 Nicotine dependence, cigarettes, uncomplicated: Secondary | ICD-10-CM | POA: Insufficient documentation

## 2021-02-02 DIAGNOSIS — R079 Chest pain, unspecified: Secondary | ICD-10-CM

## 2021-02-02 DIAGNOSIS — I5032 Chronic diastolic (congestive) heart failure: Secondary | ICD-10-CM | POA: Diagnosis not present

## 2021-02-02 DIAGNOSIS — Z79899 Other long term (current) drug therapy: Secondary | ICD-10-CM | POA: Insufficient documentation

## 2021-02-02 DIAGNOSIS — R072 Precordial pain: Secondary | ICD-10-CM | POA: Insufficient documentation

## 2021-02-02 DIAGNOSIS — I110 Hypertensive heart disease with heart failure: Secondary | ICD-10-CM | POA: Diagnosis not present

## 2021-02-02 LAB — CBC
HCT: 38.7 % — ABNORMAL LOW (ref 39.0–52.0)
Hemoglobin: 13.5 g/dL (ref 13.0–17.0)
MCH: 31 pg (ref 26.0–34.0)
MCHC: 34.9 g/dL (ref 30.0–36.0)
MCV: 88.8 fL (ref 80.0–100.0)
Platelets: 153 10*3/uL (ref 150–400)
RBC: 4.36 MIL/uL (ref 4.22–5.81)
RDW: 11.9 % (ref 11.5–15.5)
WBC: 5.9 10*3/uL (ref 4.0–10.5)
nRBC: 0 % (ref 0.0–0.2)

## 2021-02-02 LAB — BASIC METABOLIC PANEL
Anion gap: 11 (ref 5–15)
BUN: 14 mg/dL (ref 6–20)
CO2: 21 mmol/L — ABNORMAL LOW (ref 22–32)
Calcium: 9.2 mg/dL (ref 8.9–10.3)
Chloride: 102 mmol/L (ref 98–111)
Creatinine, Ser: 0.86 mg/dL (ref 0.61–1.24)
GFR, Estimated: >60 mL/min (ref >60)
Glucose, Bld: 118 mg/dL — ABNORMAL HIGH (ref 70–99)
Potassium: 3.4 mmol/L — ABNORMAL LOW (ref 3.5–5.1)
Sodium: 134 mmol/L — ABNORMAL LOW (ref 135–145)

## 2021-02-02 LAB — TROPONIN I (HIGH SENSITIVITY): Troponin I (High Sensitivity): 5 ng/L (ref <18)

## 2021-02-02 NOTE — ED Notes (Signed)
Patient transported to X-ray 

## 2021-02-02 NOTE — ED Provider Notes (Signed)
[facility] EMERGENCY DEPARTMENT Provider Note   CSN: [id number] Arrival date & time: 02/02/21  2159     History Chief Complaint  Patient presents with   Chest Pain    Jim Gordon is a 55 y.o. male.  Patient is a 55 year old male with past medical history of hypertension, discoid lupus.  Patient presenting today for evaluation of chest discomfort.  This started early this afternoon in the absence of any injury, exertion, or trauma.  He describes pressure to the left anterior chest with no associated shortness of breath, nausea, diaphoresis, or radiation to the arm or jaw.  He has history of SVT and felt like his heart might be racing earlier today, but arrives here in sinus rhythm.  He denies any recent exertional symptoms.  Patient has no prior cardiac history otherwise.  He is currently symptom-free.  There were no aggravating or alleviating factors.  The history is provided by the patient.  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   Tobacco abuse 10/11/2017   Alcohol abuse 10/11/2017   Chronic diastolic CHF (congestive heart failure) (HCC) 10/11/2017   Pneumonia 03/22/2015   Sepsis (HCC) 03/21/2015    Past Surgical History:  Procedure Laterality Date   ELBOW SURGERY Left        Family History  Problem Relation Age of Onset   Diabetes Other    Arthritis Mother     Social History   Tobacco  Use   Smoking status: Every Day    Packs/day: 1.00    Years: 20.00    Pack years: 20.00    Types: Cigarettes    Start date: 09/23/1986   Smokeless tobacco: Never  Vaping Use   Vaping Use: Never used  Substance Use Topics   Alcohol use: Yes    Alcohol/week: 50.0 standard drinks    Types: 50 Cans of beer per week   Drug use: Not Currently    Home Medications Prior to Admission medications   Medication Sig Start Date End Date Taking? Authorizing Provider  doxycycline (VIBRAMYCIN) 100 MG capsule Take 1 capsule (100 mg total) by mouth 2 (two) times daily. 01/10/20   Eber Hong, MD  ibuprofen (ADVIL,MOTRIN) 600 MG tablet Take 1 tablet (600 mg total) by mouth every 6 (six) hours as needed. 04/02/18   Burgess Amor, PA-C  metoprolol tartrate (LOPRESSOR) 25 MG tablet Take 1 tablet (25 mg total) by mouth 2 (two) times daily. 01/31/21 03/02/21  Carroll Sage, PA-C    Allergies    Bee venom and Penicillins  Review of Systems   Review of Systems  Cardiovascular:  Positive for chest pain.  All other systems reviewed and are negative.  Physical Exam Updated Vital Signs BP 124/84 (BP Location: Right Arm)   Pulse 81   Temp (!) 97.5 F (36.4 C) (Oral)   Resp 18   Ht 6'  1.5" (1.867 m)   Wt 83 kg   SpO2 98%   BMI 23.82 kg/m   Physical Exam Vitals and nursing note reviewed.  Constitutional:      General: He is not in acute distress.    Appearance: He is well-developed. He is not diaphoretic.  HENT:     Head: Normocephalic and atraumatic.  Cardiovascular:     Rate and Rhythm: Normal rate and regular rhythm.     Heart sounds: No murmur heard.   No friction rub.  Pulmonary:     Effort: Pulmonary effort is normal. No respiratory distress.     Breath sounds: Normal breath sounds. No wheezing or rales.  Abdominal:     General: Bowel sounds are normal. There is no distension.     Palpations: Abdomen is soft.     Tenderness: There is no abdominal tenderness.  Musculoskeletal:         General: Normal range of motion.     Cervical back: Normal range of motion and neck supple.     Right lower leg: No tenderness. No edema.     Left lower leg: No tenderness. No edema.  Skin:    General: Skin is warm and dry.  Neurological:     Mental Status: He is alert and oriented to person, place, and time.     Coordination: Coordination normal.    ED Results / Procedures / Treatments   Labs (all labs ordered are listed, but only abnormal results are displayed) Labs Reviewed  CBC - Abnormal; Notable for the following components:      Result Value   HCT 38.7 (*)    All other components within normal limits  BASIC METABOLIC PANEL  TROPONIN I (HIGH SENSITIVITY)    EKG EKG Interpretation  Date/Time:  Tuesday February 02 2021 22:11:44 EST Ventricular Rate:  76 PR Interval:  205 QRS Duration: 91 QT Interval:  389 QTC Calculation: 438 R Axis:   81 Text Interpretation: Sinus rhythm Borderline prolonged PR interval Consider left atrial enlargement Minimal ST elevation, inferior leads Confirmed by Bethann Berkshire [phone]) on 02/02/2021 10:20:18 PM  Radiology DG Chest 2 View  Result Date: 02/02/2021 CLINICAL DATA:  Chest pain EXAM: CHEST - 2 VIEW COMPARISON:  01/31/2021 FINDINGS: The heart size and mediastinal contours are within normal limits. Both lungs are clear. The visualized skeletal structures are unremarkable. IMPRESSION: No active cardiopulmonary disease. Electronically Signed   By: Charlett Nose M.D.   On: 02/02/2021 22:40    Procedures Procedures   Medications Ordered in ED Medications - No data to display  ED Course  I have reviewed the triage vital signs and the nursing notes.  Pertinent labs & imaging results that were available during my care of the patient were reviewed by me and considered in my medical decision making (see chart for details).    MDM Rules/Calculators/A&P  Patient presenting here with complaints of chest discomfort, the cause of which  I am uncertain, but do suspect a musculoskeletal etiology.  His work-up here is unremarkable including troponin x2 and EKG.  Patient's symptoms are somewhat atypical for cardiac pain.  I do feel as though he can safely be discharged with outpatient cardiology follow-up and as needed return if symptoms worsen.  Final Clinical Impression(s) / ED Diagnoses Final diagnoses:  None    Rx / DC Orders ED Discharge Orders     None        Geoffery Lyons, MD 02/03/21 [phone]

## 2021-02-02 NOTE — ED Triage Notes (Signed)
Pt c/o chest pain and feeling like his heart is racing. Hx of SVT.

## 2021-02-03 LAB — TROPONIN I (HIGH SENSITIVITY): Troponin I (High Sensitivity): 5 ng/L (ref <18)

## 2021-02-03 NOTE — Discharge Instructions (Signed)
Take ibuprofen 600 mg every 6 hours as needed for pain.  Follow-up with cardiology to discuss the possibility of a stress test.  The contact information for the cardiology clinic here in [place] has been provided in this discharge summary for you to call and make these arrangements.  Return to the emergency department in the meantime if your symptoms significantly worsen or change.

## 2021-03-24 ENCOUNTER — Encounter (HOSPITAL_COMMUNITY): Payer: Self-pay | Admitting: *Deleted

## 2021-03-24 ENCOUNTER — Other Ambulatory Visit: Payer: Self-pay

## 2021-03-24 ENCOUNTER — Emergency Department (HOSPITAL_COMMUNITY): Payer: Medicaid Other

## 2021-03-24 ENCOUNTER — Emergency Department (HOSPITAL_COMMUNITY)
Admission: EM | Admit: 2021-03-24 | Discharge: 2021-03-24 | Disposition: A | Discharge status: Home / Self Care | Payer: Medicaid Other | Attending: Emergency Medicine | Admitting: Emergency Medicine

## 2021-03-24 DIAGNOSIS — R002 Palpitations: Secondary | ICD-10-CM | POA: Diagnosis present

## 2021-03-24 DIAGNOSIS — Z79899 Other long term (current) drug therapy: Secondary | ICD-10-CM | POA: Diagnosis not present

## 2021-03-24 DIAGNOSIS — I471 Supraventricular tachycardia: Secondary | ICD-10-CM | POA: Diagnosis not present

## 2021-03-24 LAB — BASIC METABOLIC PANEL
Anion gap: 13 (ref 5–15)
BUN: 11 mg/dL (ref 6–20)
CO2: 21 mmol/L — ABNORMAL LOW (ref 22–32)
Calcium: 9.1 mg/dL (ref 8.9–10.3)
Chloride: 102 mmol/L (ref 98–111)
Creatinine, Ser: 1.04 mg/dL (ref 0.61–1.24)
GFR, Estimated: >60 mL/min (ref >60)
Glucose, Bld: 106 mg/dL — ABNORMAL HIGH (ref 70–99)
Potassium: 4.3 mmol/L (ref 3.5–5.1)
Sodium: 136 mmol/L (ref 135–145)

## 2021-03-24 LAB — CBC
HCT: 43.6 % (ref 39.0–52.0)
Hemoglobin: 15 g/dL (ref 13.0–17.0)
MCH: 30.7 pg (ref 26.0–34.0)
MCHC: 34.4 g/dL (ref 30.0–36.0)
MCV: 89.2 fL (ref 80.0–100.0)
Platelets: 173 10*3/uL (ref 150–400)
RBC: 4.89 MIL/uL (ref 4.22–5.81)
RDW: 12.1 % (ref 11.5–15.5)
WBC: 4.2 10*3/uL (ref 4.0–10.5)
nRBC: 0 % (ref 0.0–0.2)

## 2021-03-24 LAB — TSH: TSH: 4.311 u[IU]/mL (ref 0.350–4.500)

## 2021-03-24 LAB — MAGNESIUM: Magnesium: 2.3 mg/dL (ref 1.7–2.4)

## 2021-03-24 MED ORDER — ADENOSINE 6 MG/2ML IV SOLN
12.0000 mg | Freq: Once | INTRAVENOUS | Status: AC
Start: 2021-03-24 — End: 2021-03-24
  Administered 2021-03-24: 12 mg via INTRAVENOUS

## 2021-03-24 MED ORDER — SODIUM CHLORIDE 0.9 % IV BOLUS
1000.0000 mL | Freq: Once | INTRAVENOUS | Status: AC
  Administered 2021-03-24: 1000 mL via INTRAVENOUS

## 2021-03-24 NOTE — ED Triage Notes (Signed)
Pt c/o palpitations with feelings of sob that started at 9am today
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
## 2021-05-20 ENCOUNTER — Other Ambulatory Visit: Payer: Self-pay

## 2021-05-20 ENCOUNTER — Ambulatory Visit (INDEPENDENT_AMBULATORY_CARE_PROVIDER_SITE_OTHER): Payer: Medicaid Other | Admitting: Internal Medicine

## 2021-05-20 ENCOUNTER — Encounter: Payer: Self-pay | Admitting: Internal Medicine

## 2021-05-20 VITALS — BP 120/90 | HR 65 | Ht 73.5 in | Wt 178.6 lb

## 2021-05-20 DIAGNOSIS — I10 Essential (primary) hypertension: Secondary | ICD-10-CM | POA: Diagnosis not present

## 2021-05-20 DIAGNOSIS — L930 Discoid lupus erythematosus: Secondary | ICD-10-CM | POA: Diagnosis not present

## 2021-05-20 DIAGNOSIS — Z720 Tobacco use: Secondary | ICD-10-CM | POA: Diagnosis not present

## 2021-05-20 DIAGNOSIS — F1010 Alcohol abuse, uncomplicated: Secondary | ICD-10-CM

## 2021-05-20 MED ORDER — LISINOPRIL 10 MG PO TABS: 10.0000 mg | ORAL_TABLET | Freq: Every day | ORAL | 3 refills | Status: DC

## 2021-05-20 MED ORDER — ASPIRIN EC 81 MG PO TBEC: 81.0000 mg | DELAYED_RELEASE_TABLET | Freq: Every day | ORAL | 3 refills | Status: DC

## 2021-05-20 NOTE — Progress Notes (Signed)
?Cardiology Office Note:   ? ?Date:  05/20/2021  ? ?ID:  Jim Gordon, DOB [birth year], MRN [id number] ? ?PCP:  Patient, No Pcp Per (Inactive) ?  ?[facility] Providers ?Cardiologist:  Nona Dell, MD    ? ?Referring MD: Jacalyn Lefevre, MD  ? ?CC: Needs his medications  ?Consulted for the evaluation of re-establish care at the behest of Patient, No Pcp Per (Inactive) ? ?History of Present Illness:   ? ?Jim Gordon is a 56 y.o. male with a hx of HTN, HFpEF, Cutaneous lupus, Tobacco and alcohol abuse, hx of SVT, Aortic atherosclerosis and 3V CAC (03/2015).  who presents for to re-establish care. ? ?Patient notes that he is doing poorly.  His primary care doctor has been dead for several years.  He wasn't able to get any of his medications because of this and someone threw away his medications two weeks ago.  ?Majority of his care is ED based, last ED visit 03/24/21. ? ?No chest pain or pressure.  No SOB/DOE and no PND/Orthopnea.  No weight gain or leg swelling.  No palpitations or syncope.  He feels fine but wants to start back on his medications (he takes lisinopril 10 mg PO daily)  He still smokes.  He stills drinks with his friends. ? ? ?Past Medical History:  ?Diagnosis Date  ? Alcohol abuse   ? Discoid lupus   ? Essential hypertension   ? History of pneumonia   ? PSVT (paroxysmal supraventricular tachycardia) (HCC)   ? ? ?Past Surgical History:  ?Procedure Laterality Date  ? ELBOW SURGERY Left   ? ? ?Current Medications: ?Current Meds  ?Medication Sig  ? aspirin EC 81 MG tablet Take 1 tablet (81 mg total) by mouth daily. Swallow whole.  ? lisinopril (ZESTRIL) 10 MG tablet Take 1 tablet (10 mg total) by mouth daily.  ?  ? ?Allergies:   Bee venom and Penicillins  ? ?Social History  ? ?Socioeconomic History  ? Marital status: Single  ?  Spouse name: Not on file  ? Number of children: Not on file  ? Years of education: Not on file  ? Highest education level: Not on file  ?Occupational History  ? Not on file   ?Tobacco Use  ? Smoking status: Every Day  ?  Packs/day: 1.00  ?  Years: 20.00  ?  Pack years: 20.00  ?  Types: Cigarettes  ?  Start date: 09/23/1986  ? Smokeless tobacco: Never  ?Vaping Use  ? Vaping Use: Never used  ?Substance and Sexual Activity  ? Alcohol use: Yes  ?  Alcohol/week: 50.0 standard drinks  ?  Types: 50 Cans of beer per week  ? Drug use: Not Currently  ? Sexual activity: Yes  ?  Birth control/protection: None  ?Other Topics Concern  ? Not on file  ?Social History Narrative  ? Patient currently reports he is disabled. Denies any pet or mold exposure. No recent travel.  ? ?Social Determinants of Health  ? ?Financial Resource Strain: Not on file  ?Food Insecurity: Not on file  ?Transportation Needs: Not on file  ?Physical Activity: Not on file  ?Stress: Not on file  ?Social Connections: Not on file  ?  ? ?Family History: ?The patient's family history includes Arthritis in his mother; Diabetes in an other family member. ? ?ROS:   ?Please see the history of present illness.    ? All other systems reviewed and are negative. ? ?EKGs/Labs/Other Studies Reviewed:   ? ?  The following studies were reviewed today: ? ?Recent Labs: ?03/24/2021: BUN 11; Creatinine, Ser 1.04; Hemoglobin 15.0; Magnesium 2.3; Platelets 173; Potassium 4.3; Sodium 136; TSH 4.311  ?Recent Lipid Panel ?   ?Component Value Date/Time  ? CHOL  01/24/2010 0502  ?  170        ?ATP III CLASSIFICATION: ? <200     mg/dL   Desirable ? 347-425  mg/dL   Borderline High ? >=956    mg/dL   High ?        ? TRIG 104 01/24/2010 0502  ? HDL 90 01/24/2010 0502  ? CHOLHDL 1.9 01/24/2010 0502  ? VLDL 21 01/24/2010 0502  ? LDLCALC  01/24/2010 0502  ?  59        ?Total Cholesterol/HDL:CHD Risk ?Coronary Heart Disease Risk Table ?                    Men   Women ? 1/2 Average Risk   3.4   3.3 ? Average Risk       5.0   4.4 ? 2 X Average Risk   9.6   7.1 ? 3 X Average Risk  23.4   11.0 ?       ?Use the calculated Patient Ratio ?above and the CHD Risk Table ?to  determine the patient's CHD Risk. ?       ?ATP III CLASSIFICATION (LDL): ? <100     mg/dL   Optimal ? 387-564  mg/dL   Near or Above ?                   Optimal ? 130-159  mg/dL   Borderline ? 160-189  mg/dL   High ? >332     mg/dL   Very High  ? ?    ? ?Physical Exam:   ? ?VS:  BP 120/90   Pulse 65   Ht 6' 1.5" (1.867 m)   Wt 178 lb 9.6 oz (81 kg)   SpO2 98%   BMI 23.24 kg/m?    ? ?Wt Readings from Last 3 Encounters:  ?05/20/21 178 lb 9.6 oz (81 kg)  ?03/24/21 185 lb (83.9 kg)  ?02/02/21 183 lb (83 kg)  ?  ?Gen: No distress thin male   ?Neck: No JVD ?Cardiac: No Rubs or Gallops, no Murmur, RRR and +2 radial pulses ?Respiratory: Clear to auscultation bilaterally, normal effort, normal  respiratory rate ?GI: Soft, nontender, non-distended  ?MS: No edema;  moves all extremities ?Integument: Skin feels warm ?Neuro:  At time of evaluation, alert and oriented to person/place/time/situation  ?Psych: Normal affect, patient feels well ? ?ASSESSMENT:   ? ?1. Essential hypertension   ?2. Tobacco abuse   ?3. Discoid lupus   ?4. Alcohol abuse   ? ?PLAN:   ? ?HTN ?HFpEF ?Cutaneous lupus ?Tobacco and alcohol abuse ?Hx of SVT ?Aortic atherosclerosis and 3V CAC (03/2015) ?- patient is not on BB ?- returning lisinopril 10 mg PO daily ?- at two weeks will get CBC, CMP, Lipids ?- reviewed CAC and Aortic atherosclerosis with patient ?- starting ASA 81 mg PO daily ?- will get to PCP to establish care given high risk ?- discussing smoking and alcohol cessation ?- will see APP in 6 months ?- gave education and paper about financial mitigation (defer echo at this time) given no insurance ? ?Medication Adjustments/Labs and Tests Ordered: ?Current medicines are reviewed at length with the patient today.  Concerns regarding medicines are outlined above.  ?  Orders Placed This Encounter  ?Procedures  ? CBC  ? Comprehensive metabolic panel  ? Lipid panel  ? Ambulatory referral to Internal Medicine  ? ?Meds ordered this encounter  ?Medications   ? lisinopril (ZESTRIL) 10 MG tablet  ?  Sig: Take 1 tablet (10 mg total) by mouth daily.  ?  Dispense:  90 tablet  ?  Refill:  3  ? aspirin EC 81 MG tablet  ?  Sig: Take 1 tablet (81 mg total) by mouth daily. Swallow whole.  ?  Dispense:  90 tablet  ?  Refill:  3  ? ? ?Patient Instructions  ?Medication Instructions:  ?Start Aspirin 81 mg tablets  ?Start Lisinopril 10 mg tablets  ? ?Labwork: ?In 2 weeks: ?CBC ?CMP ?LIPIDS- Fasting ? ?Follow-Up: ?Follow up with Dr. Izora Ribas or APP in the Summer ? ?Any Other Special Instructions Will Be Listed Below (If Applicable). ? ?You have been referred to Dr. Allena Katz for PCP. ? ? ?If you need a refill on your cardiac medications before your next appointment, please call your pharmacy. ?  ? ?Signed, ?Christell Constant, MD  ?05/20/2021 2:59 PM    ? [facility] ? ?

## 2021-05-20 NOTE — Patient Instructions (Addendum)
Medication Instructions:  ?Start Aspirin 81 mg tablets  ?Start Lisinopril 10 mg tablets  ? ?Labwork: ?In 2 weeks: ?CBC ?CMP ?LIPIDS- Fasting ? ?Follow-Up: ?Follow up with Dr. Izora Ribas or APP in the Summer ? ?Any Other Special Instructions Will Be Listed Below (If Applicable). ? ?You have been referred to Dr. Allena Katz for PCP. ? ? ?If you need a refill on your cardiac medications before your next appointment, please call your pharmacy. ? ?

## 2021-10-15 ENCOUNTER — Ambulatory Visit: Payer: Medicaid Other | Admitting: Internal Medicine

## 2021-10-15 NOTE — Progress Notes (Deleted)
Cardiology Office Note:    Date:  10/15/2021   ID:  Jim Gordon, DOB 25-Jan-1966, MRN [id number]  PCP:  Patient, No Pcp Per   [facility] Providers Cardiologist:  Nona Dell, MD     Referring MD: No ref. provider found   CC:  follow up HFpEF  History of Present Illness:    Jim Gordon is a 56 y.o. male with a hx of HTN, HFpEF, Cutaneous lupus, Tobacco and alcohol abuse, hx of SVT, Aortic atherosclerosis and 3V CAC (03/2015).  who presents for to re-establish care. No further ED visits since 05/20/2021 [facility] visit.  Patient notes that he is doing ***.   Since last visit notes *** . There are no*** interval hospital/ED visit.    No chest pain or pressure ***.  No SOB/DOE*** and no PND/Orthopnea***.  No weight gain or leg swelling***.  No palpitations or syncope ***.  Ambulatory blood pressure ***.   Past Medical History:  Diagnosis Date   Alcohol abuse    Discoid lupus    Essential hypertension    History of pneumonia    PSVT (paroxysmal supraventricular tachycardia) (HCC)     Past Surgical History:  Procedure Laterality Date   ELBOW SURGERY Left     Current Medications: No outpatient medications have been marked as taking for the 10/15/21 encounter (Appointment) with Christell Constant, MD.     Allergies:   Bee venom and Penicillins   Social History   Socioeconomic History   Marital status: Single    Spouse name: Not on file   Number of children: Not on file   Years of education: Not on file   Highest education level: Not on file  Occupational History   Not on file  Tobacco Use   Smoking status: Every Day    Packs/day: 1.00    Years: 20.00    Total pack years: 20.00    Types: Cigarettes    Start date: 09/23/1986   Smokeless tobacco: Never  Vaping Use   Vaping Use: Never used  Substance and Sexual Activity   Alcohol use: Yes    Alcohol/week: 50.0 standard drinks of alcohol    Types: 50 Cans of beer per week   Drug use: Not Currently    Sexual activity: Yes    Birth control/protection: None  Other Topics Concern   Not on file  Social History Narrative   Patient currently reports he is disabled. Denies any pet or mold exposure. No recent travel.   Social Determinants of Health   Financial Resource Strain: Not on file  Food Insecurity: Not on file  Transportation Needs: Not on file  Physical Activity: Not on file  Stress: Not on file  Social Connections: Not on file     Family History: The patient's family history includes Arthritis in his mother; Diabetes in an other family member.  ROS:   Please see the history of present illness.     All other systems reviewed and are negative.  EKGs/Labs/Other Studies Reviewed:    The following studies were reviewed today:  Recent Labs: 03/24/2021: BUN 11; Creatinine, Ser 1.04; Hemoglobin 15.0; Magnesium 2.3; Platelets 173; Potassium 4.3; Sodium 136; TSH 4.311  Recent Lipid Panel    Component Value Date/Time   CHOL  01/24/2010 0502    170        ATP III CLASSIFICATION:  <200     mg/dL   Desirable  696-295  mg/dL   Borderline High  >=284  mg/dL   High          TRIG 338 01/24/2010 0502   HDL 90 01/24/2010 0502   CHOLHDL 1.9 01/24/2010 0502   VLDL 21 01/24/2010 0502   LDLCALC  01/24/2010 0502    59        Total Cholesterol/HDL:CHD Risk Coronary Heart Disease Risk Table                     Men   Women  1/2 Average Risk   3.4   3.3  Average Risk       5.0   4.4  2 X Average Risk   9.6   7.1  3 X Average Risk  23.4   11.0        Use the calculated Patient Ratio above and the CHD Risk Table to determine the patient's CHD Risk.        ATP III CLASSIFICATION (LDL):  <100     mg/dL   Optimal  250-539  mg/dL   Near or Above                    Optimal  130-159  mg/dL   Borderline  767-341  mg/dL   High  >937     mg/dL   Very High        Physical Exam:    VS:  There were no vitals taken for this visit.    Wt Readings from Last 3 Encounters:  05/20/21 178  lb 9.6 oz (81 kg)  03/24/21 185 lb (83.9 kg)  02/02/21 183 lb (83 kg)    Gen: No distress thin male   Neck: No JVD Cardiac: No Rubs or Gallops, no Murmur, RRR and +2 radial pulses Respiratory: Clear to auscultation bilaterally, normal effort, normal  respiratory rate GI: Soft, nontender, non-distended  MS: No edema;  moves all extremities Integument: Skin feels warm Neuro:  At time of evaluation, alert and oriented to person/place/time/situation  Psych: Normal affect, patient feels well  ASSESSMENT:    No diagnosis found.  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  Concerns regarding medicines are outlined above.  No orders of the defined types were placed in this encounter.  No orders of the defined types were placed in this encounter.   There are no Patient Instructions on file for this visit.   Signed, Christell Constant, MD  10/15/2021 11:36 AM    [facility]

## 2022-01-31 ENCOUNTER — Emergency Department (HOSPITAL_COMMUNITY): Payer: Medicaid Other

## 2022-01-31 ENCOUNTER — Emergency Department (HOSPITAL_COMMUNITY)
Admission: EM | Admit: 2022-01-31 | Discharge: 2022-01-31 | Disposition: A | Discharge status: Home / Self Care | Payer: Medicaid Other | Attending: Emergency Medicine | Admitting: Emergency Medicine

## 2022-01-31 ENCOUNTER — Encounter (HOSPITAL_COMMUNITY): Payer: Self-pay | Admitting: *Deleted

## 2022-01-31 ENCOUNTER — Other Ambulatory Visit: Payer: Self-pay

## 2022-01-31 DIAGNOSIS — Z79899 Other long term (current) drug therapy: Secondary | ICD-10-CM | POA: Insufficient documentation

## 2022-01-31 DIAGNOSIS — F17200 Nicotine dependence, unspecified, uncomplicated: Secondary | ICD-10-CM | POA: Insufficient documentation

## 2022-01-31 DIAGNOSIS — I471 Supraventricular tachycardia, unspecified: Secondary | ICD-10-CM | POA: Insufficient documentation

## 2022-01-31 DIAGNOSIS — R000 Tachycardia, unspecified: Secondary | ICD-10-CM | POA: Diagnosis present

## 2022-01-31 DIAGNOSIS — I10 Essential (primary) hypertension: Secondary | ICD-10-CM | POA: Insufficient documentation

## 2022-01-31 LAB — CBC WITH DIFFERENTIAL/PLATELET
Abs Immature Granulocytes: 0.01 10*3/uL (ref 0.00–0.07)
Basophils Absolute: 0 10*3/uL (ref 0.0–0.1)
Basophils Relative: 1 %
Eosinophils Absolute: 0.2 10*3/uL (ref 0.0–0.5)
Eosinophils Relative: 3 %
HCT: 40.2 % (ref 39.0–52.0)
Hemoglobin: 13.7 g/dL (ref 13.0–17.0)
Immature Granulocytes: 0 %
Lymphocytes Relative: 60 %
Lymphs Abs: 3 10*3/uL (ref 0.7–4.0)
MCH: 29.8 pg (ref 26.0–34.0)
MCHC: 34.1 g/dL (ref 30.0–36.0)
MCV: 87.4 fL (ref 80.0–100.0)
Monocytes Absolute: 0.3 10*3/uL (ref 0.1–1.0)
Monocytes Relative: 6 %
Neutro Abs: 1.5 10*3/uL — ABNORMAL LOW (ref 1.7–7.7)
Neutrophils Relative %: 30 %
Platelets: 174 10*3/uL (ref 150–400)
RBC: 4.6 MIL/uL (ref 4.22–5.81)
RDW: 12.1 % (ref 11.5–15.5)
WBC: 5.1 10*3/uL (ref 4.0–10.5)
nRBC: 0 % (ref 0.0–0.2)

## 2022-01-31 LAB — BASIC METABOLIC PANEL
Anion gap: 9 (ref 5–15)
BUN: 10 mg/dL (ref 6–20)
CO2: 20 mmol/L — ABNORMAL LOW (ref 22–32)
Calcium: 9 mg/dL (ref 8.9–10.3)
Chloride: 105 mmol/L (ref 98–111)
Creatinine, Ser: 0.79 mg/dL (ref 0.61–1.24)
GFR, Estimated: >60 mL/min (ref >60)
Glucose, Bld: 87 mg/dL (ref 70–99)
Potassium: 3.9 mmol/L (ref 3.5–5.1)
Sodium: 134 mmol/L — ABNORMAL LOW (ref 135–145)

## 2022-01-31 LAB — TROPONIN I (HIGH SENSITIVITY)
Troponin I (High Sensitivity): 8 ng/L (ref <18)
Troponin I (High Sensitivity): 6 ng/L (ref <18)

## 2022-01-31 NOTE — Discharge Instructions (Signed)
Can appointment to follow-up with cardiology information provided above.  Return for rapid heart rate lasting 40 minutes or longer or for any chest pain.  Despite the changes in your EKGs your cardiac markers showed no evidence of any heart injury or concern today.

## 2022-01-31 NOTE — ED Triage Notes (Signed)
Pt with hx of SVT, c/o heart racing for past few hours with dizziness, SOB and HA

## 2022-01-31 NOTE — ED Provider Notes (Signed)
[facility] EMERGENCY DEPARTMENT Provider Note   CSN: [id number] Arrival date & time: 01/31/22  1914     History  Chief Complaint  Patient presents with   Tachycardia    Jim Gordon is a 56 y.o. male.  Patient with a history of SVT.  Last seen in the emergency department for it in January of this year.  Patient states that that around 5:00 this evening his heart started going fast.  It is continued to go fast.  Associated with some dizziness and shortness of breath denies any chest pain associated with it before.  Patient not followed by cardiology.  Patient has a history of high blood pressure and he is on lisinopril for that.  Also has a history of lupus and history of alcohol abuse.  Is an everyday smoker.       Home Medications Prior to Admission medications   Medication Sig Start Date End Date Taking? Authorizing Provider  ibuprofen (ADVIL,MOTRIN) 600 MG tablet Take 1 tablet (600 mg total) by mouth every 6 (six) hours as needed. 04/02/18  Yes Idol, Raynelle Fanning, PA-C  lisinopril (ZESTRIL) 10 MG tablet Take 1 tablet (10 mg total) by mouth daily. 05/20/21 01/31/22 Yes Chandrasekhar, Rondel Jumbo, MD      Allergies    Bee venom and Penicillins    Review of Systems   Review of Systems  Constitutional:  Negative for chills and fever.  HENT:  Negative for rhinorrhea and sore throat.   Eyes:  Negative for visual disturbance.  Respiratory:  Positive for shortness of breath. Negative for cough.   Cardiovascular:  Positive for palpitations. Negative for chest pain and leg swelling.  Gastrointestinal:  Negative for abdominal pain, diarrhea, nausea and vomiting.  Genitourinary:  Negative for dysuria.  Musculoskeletal:  Negative for back pain and neck pain.  Skin:  Negative for rash.  Neurological:  Positive for dizziness. Negative for light-headedness and headaches.  Hematological:  Does not bruise/bleed easily.  Psychiatric/Behavioral:  Negative for confusion.     Physical  Exam Updated Vital Signs BP 115/88   Pulse 64   Temp 97.8 F (36.6 C) (Oral)   Resp 12   SpO2 95%  Physical Exam Vitals and nursing note reviewed.  Constitutional:      General: He is not in acute distress.    Appearance: Normal appearance. He is well-developed.  HENT:     Head: Normocephalic and atraumatic.     Mouth/Throat:     Mouth: Mucous membranes are moist.  Eyes:     Extraocular Movements: Extraocular movements intact.     Conjunctiva/sclera: Conjunctivae normal.     Pupils: Pupils are equal, round, and reactive to light.  Cardiovascular:     Rate and Rhythm: Regular rhythm. Tachycardia present.     Heart sounds: No murmur heard. Pulmonary:     Effort: Pulmonary effort is normal. No respiratory distress.     Breath sounds: Normal breath sounds. No wheezing, rhonchi or rales.  Abdominal:     Palpations: Abdomen is soft.     Tenderness: There is no abdominal tenderness.  Musculoskeletal:        General: No swelling.     Cervical back: Normal range of motion and neck supple.  Skin:    General: Skin is warm and dry.     Capillary Refill: Capillary refill takes less than 2 seconds.  Neurological:     General: No focal deficit present.     Mental Status: He is alert and  oriented to person, place, and time.     Cranial Nerves: No cranial nerve deficit.     Sensory: No sensory deficit.  Psychiatric:        Mood and Affect: Mood normal.     ED Results / Procedures / Treatments   Labs (all labs ordered are listed, but only abnormal results are displayed) Labs Reviewed  CBC WITH DIFFERENTIAL/PLATELET - Abnormal; Notable for the following components:      Result Value   Neutro Abs 1.5 (*)    All other components within normal limits  BASIC METABOLIC PANEL - Abnormal; Notable for the following components:   Sodium 134 (*)    CO2 20 (*)    All other components within normal limits  TROPONIN I (HIGH SENSITIVITY)  TROPONIN I (HIGH SENSITIVITY)    EKG EKG  Interpretation  Date/Time:  Monday January 31 2022 20:30:22 EST Ventricular Rate:  72 PR Interval:  177 QRS Duration: 87 QT Interval:  395 QTC Calculation: 433 R Axis:   76 Text Interpretation: Sinus rhythm Inferior infarct, acute (LCx) Probable anteroseptal infarct, old Lateral leads are also involved Some inferior and lateral changes, no chest pain, just converted from marked SVT with rate of 170's Confirmed by Vanetta Mulders [phone]) on 01/31/2022 8:38:52 PM  Radiology DG Chest Port 1 View  Result Date: 01/31/2022 CLINICAL DATA:  Chest pain, palpitations EXAM: PORTABLE CHEST 1 VIEW COMPARISON:  03/24/2021 FINDINGS: 2 frontal views of the chest demonstrates external defibrillator pads overlying the cardiac silhouette. The heart is unremarkable. No acute airspace disease. Chronic right pleural thickening. No pneumothorax. No acute bony abnormalities. IMPRESSION: 1. Stable chest, no acute process. Electronically Signed   By: Sharlet Salina M.D.   On: 01/31/2022 21:03    Procedures Procedures    Medications Ordered in ED Medications - No data to display  ED Course/ Medical Decision Making/ A&P                           Medical Decision Making Amount and/or Complexity of Data Reviewed Labs: ordered. Radiology: ordered.   CRITICAL CARE Performed by: Vanetta Mulders Total critical care time: 35 minutes Critical care time was exclusive of separately billable procedures and treating other patients. Critical care was necessary to treat or prevent imminent or life-threatening deterioration. Critical care was time spent personally by me on the following activities: development of treatment plan with patient and/or surrogate as well as nursing, discussions with consultants, evaluation of patient's response to treatment, examination of patient, obtaining history from patient or surrogate, ordering and performing treatments and interventions, ordering and review of laboratory studies,  ordering and review of radiographic studies, pulse oximetry and re-evaluation of patient's condition.  Patient presenting with significant supraventricular tachycardia rate in the 170s.  No chest pain associated with it.  Was preparing to do adenosine.  Patient spontaneously converted we will continue cardiac monitoring.  Repeat EKG after the spontaneous conversion shows some inferior ST elevation and some lateral ST elevation patient's had some subtle changes like that in the past.  Currently no chest pain at all think this may be some rate related ischemia.  We will continue to monitor and recheck and continue to evaluate.  Do not think he is having an acute MI at this time but based on these changes we will get troponins.  Patient heart rhythm is remained in sinus.  Still has the subtle ST changes.  But troponins x2 first 1  was 8-second was 6 no evidence of an acute cardiac event.  Patient is never complained of any chest pain.  Feel that this may be sort of normal baseline EKG for him.  We will give him referral to cardiology for follow-up.  He will return for any recurrence of the rapid heart rate lasting 40 minutes or longer or for development of any chest pain again he has had no chest pain.  Stable for discharge home.   Final Clinical Impression(s) / ED Diagnoses Final diagnoses:  SVT (supraventricular tachycardia)    Rx / DC Orders ED Discharge Orders     None         Vanetta Mulders, MD 01/31/22 2328

## 2022-03-21 ENCOUNTER — Other Ambulatory Visit: Payer: Self-pay

## 2022-03-21 ENCOUNTER — Encounter (HOSPITAL_COMMUNITY): Payer: Self-pay | Admitting: Emergency Medicine

## 2022-03-21 ENCOUNTER — Emergency Department (HOSPITAL_COMMUNITY)
Admission: EM | Admit: 2022-03-21 | Discharge: 2022-03-21 | Disposition: A | Discharge status: Home / Self Care | Payer: Medicaid Other | Attending: Emergency Medicine | Admitting: Emergency Medicine

## 2022-03-21 ENCOUNTER — Emergency Department (HOSPITAL_COMMUNITY): Payer: Medicaid Other

## 2022-03-21 DIAGNOSIS — R008 Other abnormalities of heart beat: Secondary | ICD-10-CM | POA: Diagnosis present

## 2022-03-21 DIAGNOSIS — R000 Tachycardia, unspecified: Secondary | ICD-10-CM | POA: Diagnosis not present

## 2022-03-21 DIAGNOSIS — I471 Supraventricular tachycardia, unspecified: Secondary | ICD-10-CM

## 2022-03-21 LAB — CBC
HCT: 40.5 % (ref 39.0–52.0)
Hemoglobin: 13.7 g/dL (ref 13.0–17.0)
MCH: 29.6 pg (ref 26.0–34.0)
MCHC: 33.8 g/dL (ref 30.0–36.0)
MCV: 87.5 fL (ref 80.0–100.0)
Platelets: 204 10*3/uL (ref 150–400)
RBC: 4.63 MIL/uL (ref 4.22–5.81)
RDW: 13.2 % (ref 11.5–15.5)
WBC: 4.9 10*3/uL (ref 4.0–10.5)
nRBC: 0 % (ref 0.0–0.2)

## 2022-03-21 LAB — BASIC METABOLIC PANEL
Anion gap: 13 (ref 5–15)
BUN: 10 mg/dL (ref 6–20)
CO2: 20 mmol/L — ABNORMAL LOW (ref 22–32)
Calcium: 9.1 mg/dL (ref 8.9–10.3)
Chloride: 106 mmol/L (ref 98–111)
Creatinine, Ser: 1.28 mg/dL — ABNORMAL HIGH (ref 0.61–1.24)
GFR, Estimated: >60 mL/min (ref >60)
Glucose, Bld: 95 mg/dL (ref 70–99)
Potassium: 4.2 mmol/L (ref 3.5–5.1)
Sodium: 139 mmol/L (ref 135–145)

## 2022-03-21 LAB — TROPONIN I (HIGH SENSITIVITY): Troponin I (High Sensitivity): 10 ng/L (ref <18)

## 2022-03-21 MED ORDER — ADENOSINE 6 MG/2ML IV SOLN
6.0000 mg | Freq: Once | INTRAVENOUS | Status: AC
  Administered 2022-03-21: 6 mg via INTRAVENOUS

## 2022-03-21 MED ORDER — ADENOSINE 6 MG/2ML IV SOLN
12.0000 mg | Freq: Once | INTRAVENOUS | Status: AC
  Administered 2022-03-21: 12 mg via INTRAVENOUS

## 2022-03-21 NOTE — Discharge Instructions (Signed)
Follow-up with your cardiologist in 1 to 2 weeks to discuss these episodes of fast heart rate you are having

## 2022-03-21 NOTE — ED Provider Notes (Incomplete)
[facility] EMERGENCY DEPARTMENT Provider Note   CSN: [id number] Arrival date & time: 03/21/22  2151     History {Add pertinent medical, surgical, social history, OB history to HPI:1} Chief Complaint  Patient presents with   Irregular Heart Beat    Jim Gordon is a 57 y.o. male.  Patient has a history of lupus and also has a history of SVT.  Patient complains of palpitations   Palpitations      Home Medications Prior to Admission medications   Medication Sig Start Date End Date Taking? Authorizing Provider  ibuprofen (ADVIL,MOTRIN) 600 MG tablet Take 1 tablet (600 mg total) by mouth every 6 (six) hours as needed. 04/02/18   Evalee Jefferson, PA-C  lisinopril (ZESTRIL) 10 MG tablet Take 1 tablet (10 mg total) by mouth daily. 05/20/21 01/31/22  Werner Lean, MD      Allergies    Bee venom and Penicillins    Review of Systems   Review of Systems  Cardiovascular:  Positive for palpitations.    Physical Exam Updated Vital Signs BP 104/87   Pulse 95   Temp 98.3 F (36.8 C) (Oral)   Resp 16   Ht 6' 1.5" (1.867 m)   Wt 81 kg   SpO2 100%   BMI 23.24 kg/m  Physical Exam  ED Results / Procedures / Treatments   Labs (all labs ordered are listed, but only abnormal results are displayed) Labs Reviewed  BASIC METABOLIC PANEL - Abnormal; Notable for the following components:      Result Value   CO2 20 (*)    Creatinine, Ser 1.28 (*)    All other components within normal limits  CBC  TROPONIN I (HIGH SENSITIVITY)    EKG None  Radiology DG Chest Port 1 View  Result Date: 03/21/2022 CLINICAL DATA:  Chest pain, irregular heartbeat. EXAM: PORTABLE CHEST 1 VIEW COMPARISON:  01/31/2022. FINDINGS: The heart size and mediastinal contours are within normal limits. Mild atherosclerotic calcification of the aorta. There is a small right pleural effusion. No pneumothorax bilaterally. No acute osseous abnormality. IMPRESSION: Small right pleural effusion. Electronically  Signed   By: Brett Fairy M.D.   On: 03/21/2022 22:42    Procedures Procedures  {Document cardiac monitor, telemetry assessment procedure when appropriate:1}  Medications Ordered in ED Medications  adenosine (ADENOCARD) 6 MG/2ML injection 6 mg (6 mg Intravenous Given 03/21/22 2206)  adenosine (ADENOCARD) 6 MG/2ML injection 12 mg (12 mg Intravenous Given 03/21/22 2211)    ED Course/ Medical Decision Making/ A&P  CRITICAL CARE Performed by: Milton Ferguson Total critical care time: 45 minutes Critical care time was exclusive of separately billable procedures and treating other patients. Critical care was necessary to treat or prevent imminent or life-threatening deterioration. Critical care was time spent personally by me on the following activities: development of treatment plan with patient and/or surrogate as well as nursing, discussions with consultants, evaluation of patient's response to treatment, examination of patient, obtaining history from patient or surrogate, ordering and performing treatments and interventions, ordering and review of laboratory studies, ordering and review of radiographic studies, pulse oximetry and re-evaluation of patient's condition.   Patient with SVT.  He was given adenosine 6 mg but did not convert.  He was then given 12 mg and did convert.                         Medical Decision Making Amount and/or Complexity of Data Reviewed Labs: ordered.  Risk Prescription drug management.   Patient with SVT.  He will follow-up with cardiology  {Document critical care time when appropriate:1} {Document review of labs and clinical decision tools ie heart score, Chads2Vasc2 etc:1}  {Document your independent review of radiology images, and any outside records:1} {Document your discussion with family members, caretakers, and with consultants:1} {Document social determinants of health affecting pt's care:1} {Document your decision making why or why not admission,  treatments were needed:1} Final Clinical Impression(s) / ED Diagnoses Final diagnoses:  None    Rx / DC Orders ED Discharge Orders     None

## 2022-03-21 NOTE — ED Notes (Signed)
Went over dc papers. All questions answered. 

## 2022-03-21 NOTE — ED Notes (Signed)
Patient is on cardiac monitor with ZOLL electrodes in place.

## 2022-03-21 NOTE — ED Triage Notes (Addendum)
C/o irregular heart beat. ETOH this morning. Pt states they feel dizzy. Started this afternoon around 4/5pm

## 2022-04-16 ENCOUNTER — Emergency Department (HOSPITAL_COMMUNITY)
Admission: EM | Admit: 2022-04-16 | Discharge: 2022-04-17 | Disposition: A | Discharge status: Home / Self Care | Payer: Medicaid Other | Attending: Emergency Medicine | Admitting: Emergency Medicine

## 2022-04-16 ENCOUNTER — Emergency Department (HOSPITAL_COMMUNITY): Payer: Medicaid Other

## 2022-04-16 DIAGNOSIS — I471 Supraventricular tachycardia, unspecified: Secondary | ICD-10-CM

## 2022-04-16 DIAGNOSIS — R079 Chest pain, unspecified: Secondary | ICD-10-CM | POA: Diagnosis present

## 2022-04-16 LAB — CBC
HCT: 38.7 % — ABNORMAL LOW (ref 39.0–52.0)
Hemoglobin: 13.1 g/dL (ref 13.0–17.0)
MCH: 29.8 pg (ref 26.0–34.0)
MCHC: 33.9 g/dL (ref 30.0–36.0)
MCV: 88.2 fL (ref 80.0–100.0)
Platelets: 192 10*3/uL (ref 150–400)
RBC: 4.39 MIL/uL (ref 4.22–5.81)
RDW: 12.8 % (ref 11.5–15.5)
WBC: 6.2 10*3/uL (ref 4.0–10.5)
nRBC: 0 % (ref 0.0–0.2)

## 2022-04-16 LAB — BASIC METABOLIC PANEL
Anion gap: 10 (ref 5–15)
BUN: 11 mg/dL (ref 6–20)
CO2: 23 mmol/L (ref 22–32)
Calcium: 9.3 mg/dL (ref 8.9–10.3)
Chloride: 103 mmol/L (ref 98–111)
Creatinine, Ser: 0.97 mg/dL (ref 0.61–1.24)
GFR, Estimated: >60 mL/min (ref >60)
Glucose, Bld: 82 mg/dL (ref 70–99)
Potassium: 3.5 mmol/L (ref 3.5–5.1)
Sodium: 136 mmol/L (ref 135–145)

## 2022-04-16 LAB — MAGNESIUM: Magnesium: 2.3 mg/dL (ref 1.7–2.4)

## 2022-04-16 LAB — TROPONIN I (HIGH SENSITIVITY): Troponin I (High Sensitivity): 7 ng/L (ref <18)

## 2022-04-16 MED ORDER — LACTATED RINGERS IV BOLUS
1000.0000 mL | Freq: Once | INTRAVENOUS | Status: AC
  Administered 2022-04-16: 1000 mL via INTRAVENOUS

## 2022-04-16 MED ORDER — ADENOSINE 6 MG/2ML IV SOLN
6.0000 mg | Freq: Once | INTRAVENOUS | Status: AC
  Administered 2022-04-16: 6 mg via INTRAVENOUS

## 2022-04-16 MED ORDER — DILTIAZEM HCL 25 MG/5ML IV SOLN
20.0000 mg | Freq: Once | INTRAVENOUS | Status: AC
  Administered 2022-04-16: 20 mg via INTRAVENOUS
  Filled 2022-04-16: qty 5

## 2022-04-16 NOTE — ED Triage Notes (Signed)
Pt to ED c/o chest pain, reports feels that heart is beating fast, started 1 hour ago. Reports SHOB. Denies N/V. Reports ETOH  2 hours ago.

## 2022-04-16 NOTE — ED Provider Notes (Addendum)
[name] Provider Note   CSN: [id number] Arrival date & time: 04/16/22  2111     History  Chief Complaint  Patient presents with   Chest Pain    Jim Gordon is a 57 y.o. male.  HPI 57 year old male with a history of alcohol abuse and SVT presents with shortness of breath, chest pain, fast heart rate.  Started about 2 hours ago.  He has chronic alcohol abuse and has been drinking today.  Otherwise he is a poor historian and appears mildly intoxicated.  Home Medications Prior to Admission medications   Medication Sig Start Date End Date Taking? Authorizing Provider  ibuprofen (ADVIL,MOTRIN) 600 MG tablet Take 1 tablet (600 mg total) by mouth every 6 (six) hours as needed. 04/02/18   Evalee Jefferson, PA-C  lisinopril (ZESTRIL) 10 MG tablet Take 1 tablet (10 mg total) by mouth daily. 05/20/21 01/31/22  Werner Lean, MD      Allergies    Bee venom and Penicillins    Review of Systems   Review of Systems  Physical Exam Updated Vital Signs BP 115/73   Pulse 60   Temp (!) 97.5 F (36.4 C) (Oral)   Resp (!) 22   Ht 6' 1.5" (1.867 m)   Wt 81.2 kg   SpO2 100%   BMI 23.30 kg/m  Physical Exam  ED Results / Procedures / Treatments   Labs (all labs ordered are listed, but only abnormal results are displayed) Labs Reviewed  CBC - Abnormal; Notable for the following components:      Result Value   HCT 38.7 (*)    All other components within normal limits  BASIC METABOLIC PANEL  MAGNESIUM  TROPONIN I (HIGH SENSITIVITY)    EKG EKG Interpretation  Date/Time:  Saturday April 16 2022 21:43:58 EST Ventricular Rate:  99 PR Interval:  199 QRS Duration: 97 QT Interval:  324 QTC Calculation: 416 R Axis:   37 Text Interpretation: Sinus tachycardia Atrial premature complex Borderline prolonged PR interval Left atrial enlargement Borderline T wave abnormalities SVT resolved Confirmed by Sherwood Gambler [id number]) on  04/16/2022 11:25:39 PM  Radiology DG Chest Port 1 View  Result Date: 04/16/2022 CLINICAL DATA:  Chest pain and tachycardia, initial encounter EXAM: PORTABLE CHEST 1 VIEW COMPARISON:  03/21/2022 FINDINGS: Cardiac shadow is within normal limits. The lungs are well aerated bilaterally. No focal infiltrate or effusion is seen. Stable blunting of the right costophrenic angle is noted. No bony abnormality is seen. IMPRESSION: No acute abnormality noted. Electronically Signed   By: Inez Catalina M.D.   On: 04/16/2022 22:04    Procedures .Critical Care  Performed by: Sherwood Gambler, MD Authorized by: Sherwood Gambler, MD   Critical care provider statement:    Critical care time (minutes):  40   Critical care time was exclusive of:  Separately billable procedures and treating other patients   Critical care was necessary to treat or prevent imminent or life-threatening deterioration of the following conditions:  Cardiac failure   Critical care was time spent personally by me on the following activities:  Development of treatment plan with patient or surrogate, discussions with consultants, evaluation of patient's response to treatment, examination of patient, ordering and review of laboratory studies, ordering and review of radiographic studies, ordering and performing treatments and interventions, pulse oximetry, re-evaluation of patient's condition and review of old charts     Medications Ordered in ED Medications  lactated ringers bolus 1,000 mL (1,000  mLs Intravenous New Bag/Given 04/16/22 2144)  adenosine (ADENOCARD) 6 MG/2ML injection 6 mg (6 mg Intravenous Given 04/16/22 2140)  diltiazem (CARDIZEM) injection 20 mg (20 mg Intravenous Given 04/16/22 2218)    ED Course/ Medical Decision Making/ A&P                             Medical Decision Making Amount and/or Complexity of Data Reviewed External Data Reviewed: notes.    Details: Cardiology note 3/23 Labs: ordered.    Details: Normal  hemoglobin.  Electrolytes unremarkable.  Normal troponin. Radiology: ordered and independent interpretation performed.    Details: No CHF. ECG/medicine tests: independent interpretation performed.    Details: SVT.  Risk Prescription drug management.   Patient presents with recurrent SVT.  Chart review shows he is intermittently here for this.  He has seen cards before but it does not appear that he is on a beta-blocker though I cannot tell from the note why.  He was given adenosine and seem to improve but then about 30 minutes later went back into SVT and required diltiazem.  Has improved again.  He is now asymptomatic.  Given that he is asymptomatic, I suspect his chest pain was completely related to the high heart rate rather than ACS.  Troponins have been obtained from triage but I do not think repeats are needed as ACS seems unlikely since his symptoms resolved.  Also doubt PE or dissection.  Will plan to observe and assuming no further SVT or episodes I think he is stable for discharge.  Care transferred to Dr. Christy Gentles.        Final Clinical Impression(s) / ED Diagnoses Final diagnoses:  SVT (supraventricular tachycardia)    Rx / DC Orders ED Discharge Orders          Ordered    Ambulatory referral to Cardiology       Comments: If you have not heard from the Cardiology office within the next 72 hours please call [phone].   04/16/22 2330              Sherwood Gambler, MD 04/16/22 4174    Sherwood Gambler, MD 04/16/22 [phone]

## 2022-04-16 NOTE — Discharge Instructions (Signed)
It is very important to cut back or even stop alcohol use.  Is also bored to stop smoking.  You need to follow-up with cardiology to discuss options for SVT, the recurrent heart problem you keep having.  If you develop new or worsening chest pain, palpitations or high heart rate, shortness of breath, or any other new/concerning symptoms and return to the ER or call [phone].

## 2022-04-17 NOTE — ED Provider Notes (Signed)
Patient decided to get up and leave   Ripley Fraise, MD 04/17/22 [phone]

## 2022-04-17 NOTE — ED Notes (Signed)
Pt states he is unwilling to stay for the rest of his observation for SVT. Pt states that he understands that his fast HR could come back and that it is a dangerous rhythm to be in, states he understands that he could have worse symptoms" Provider aware that pt is leaving prior to d/c paperwork being given

## 2022-05-02 ENCOUNTER — Emergency Department (HOSPITAL_COMMUNITY)
Admission: EM | Admit: 2022-05-02 | Discharge: 2022-05-02 | Discharge status: Against Medical Advice | Payer: Medicaid Other | Attending: Emergency Medicine | Admitting: Emergency Medicine

## 2022-05-02 ENCOUNTER — Encounter (HOSPITAL_COMMUNITY): Payer: Self-pay | Admitting: Emergency Medicine

## 2022-05-02 ENCOUNTER — Other Ambulatory Visit: Payer: Self-pay

## 2022-05-02 DIAGNOSIS — I471 Supraventricular tachycardia, unspecified: Secondary | ICD-10-CM | POA: Insufficient documentation

## 2022-05-02 DIAGNOSIS — R42 Dizziness and giddiness: Secondary | ICD-10-CM | POA: Insufficient documentation

## 2022-05-02 DIAGNOSIS — R000 Tachycardia, unspecified: Secondary | ICD-10-CM | POA: Diagnosis present

## 2022-05-02 DIAGNOSIS — R519 Headache, unspecified: Secondary | ICD-10-CM | POA: Insufficient documentation

## 2022-05-02 DIAGNOSIS — Z5329 Procedure and treatment not carried out because of patient's decision for other reasons: Secondary | ICD-10-CM | POA: Insufficient documentation

## 2022-05-02 DIAGNOSIS — Z79899 Other long term (current) drug therapy: Secondary | ICD-10-CM | POA: Diagnosis not present

## 2022-05-02 LAB — CBC
HCT: 40.3 % (ref 39.0–52.0)
Hemoglobin: 13.3 g/dL (ref 13.0–17.0)
MCH: 30 pg (ref 26.0–34.0)
MCHC: 33 g/dL (ref 30.0–36.0)
MCV: 90.8 fL (ref 80.0–100.0)
Platelets: 212 10*3/uL (ref 150–400)
RBC: 4.44 MIL/uL (ref 4.22–5.81)
RDW: 13.8 % (ref 11.5–15.5)
WBC: 4.5 10*3/uL (ref 4.0–10.5)
nRBC: 0 % (ref 0.0–0.2)

## 2022-05-02 LAB — COMPREHENSIVE METABOLIC PANEL
ALT: 17 U/L (ref 0–44)
AST: 23 U/L (ref 15–41)
Albumin: 4.2 g/dL (ref 3.5–5.0)
Alkaline Phosphatase: 56 U/L (ref 38–126)
Anion gap: 11 (ref 5–15)
BUN: 9 mg/dL (ref 6–20)
CO2: 22 mmol/L (ref 22–32)
Calcium: 8.9 mg/dL (ref 8.9–10.3)
Chloride: 104 mmol/L (ref 98–111)
Creatinine, Ser: 0.72 mg/dL (ref 0.61–1.24)
GFR, Estimated: >60 mL/min (ref >60)
Glucose, Bld: 96 mg/dL (ref 70–99)
Potassium: 3.6 mmol/L (ref 3.5–5.1)
Sodium: 137 mmol/L (ref 135–145)
Total Bilirubin: 0.5 mg/dL (ref 0.3–1.2)
Total Protein: 8 g/dL (ref 6.5–8.1)

## 2022-05-02 LAB — MAGNESIUM: Magnesium: 2.2 mg/dL (ref 1.7–2.4)

## 2022-05-02 LAB — BRAIN NATRIURETIC PEPTIDE: B Natriuretic Peptide: 25 pg/mL (ref 0.0–100.0)

## 2022-05-02 MED ORDER — SODIUM CHLORIDE 0.9 % IV BOLUS
1000.0000 mL | Freq: Once | INTRAVENOUS | Status: AC
  Administered 2022-05-02: 1000 mL via INTRAVENOUS

## 2022-05-02 MED ORDER — METOPROLOL SUCCINATE ER 25 MG PO TB24: 25.0000 mg | ORAL_TABLET | Freq: Every day | ORAL | 0 refills | Status: DC

## 2022-05-02 MED ORDER — ADENOSINE 6 MG/2ML IV SOLN: 12.0000 mg | Freq: Once | INTRAVENOUS | Status: DC

## 2022-05-02 MED ORDER — ADENOSINE 6 MG/2ML IV SOLN
6.0000 mg | Freq: Once | INTRAVENOUS | Status: AC
  Administered 2022-05-02: 6 mg via INTRAVENOUS

## 2022-05-02 NOTE — ED Notes (Signed)
Pt verbalizes understanding of risks of leaving AMA, form signed

## 2022-05-02 NOTE — ED Notes (Signed)
Pharmacy  called for Adenosine

## 2022-05-02 NOTE — ED Triage Notes (Signed)
Pt c/o fast HR stating "feels like my heart is racing", states he was smoking and drinking lastnight for the superbowl and woke up about an hour ago with the feeling  of his heart racing. Also states he has a Hx of high BP and has not been able to take hi sBP meds in 20 days, Hpwever, BP normal at this time. HR in the 170s

## 2022-05-02 NOTE — ED Notes (Signed)
Pt states "I am ready to go", this RN explained to pt that we typically like to watch pt's after giving Adenosine for a little bit since it a medication that is for rhythm control--pt still states "I dont care I am trying to go, I have to go to work and Im ready to go" MD made aware

## 2022-05-02 NOTE — Discharge Instructions (Addendum)
You were seen for your rapid heart rate in the emergency department.  This may be related to alcohol intake so please be sure to not drink alcohol excessively.  At home, please start taking the metoprolol that we have prescribed you which will help with your blood pressure and heart rate (please stop taking the lisinopril for the time being).    Check your MyChart online for the results of any tests that had not resulted by the time you left the emergency department.   Follow-up with your primary doctor in 2-3 days regarding your visit.  Follow-up with your cardiologist soon as possible.  Return immediately to the emergency department if you experience any of the following: Palpitations, chest pain, shortness of breath, or any other concerning symptoms.    Thank you for visiting our Emergency Department. It was a pleasure taking care of you today.

## 2022-05-02 NOTE — ED Provider Notes (Signed)
[facility] Provider Note   CSN: [id number] Arrival date & time: 05/02/22  [id number]     History {Add pertinent medical, surgical, social history, OB history to HPI:1} Chief Complaint  Patient presents with   Tachycardia    Jim Gordon is a 57 y.o. male.  57 year old male with a history of alcohol use, SLE, and SVT who presents emergency department palpitations.  Patient reports that he was drinking alcohol yesterday with some friends while watching the Super Bowl and this morning woke up and was having palpitations.  Also says that he has had some mild dizziness and headache.  No recent illnesses.  No other illicit substance use.  Denies any chest pain or shortness of breath.       Home Medications Prior to Admission medications   Medication Sig Start Date End Date Taking? Authorizing Provider  ibuprofen (ADVIL,MOTRIN) 600 MG tablet Take 1 tablet (600 mg total) by mouth every 6 (six) hours as needed. 04/02/18   Evalee Jefferson, PA-C  lisinopril (ZESTRIL) 10 MG tablet Take 1 tablet (10 mg total) by mouth daily. 05/20/21 01/31/22  Werner Lean, MD      Allergies    Bee venom and Penicillins    Review of Systems   Review of Systems  Physical Exam Updated Vital Signs BP 102/84   Pulse (!) 164   Temp 98.5 F (36.9 C) (Oral)   Resp 16   Ht 6' 1.5" (1.867 m)   Wt 81.2 kg   SpO2 97%   BMI 23.30 kg/m  Physical Exam Vitals and nursing note reviewed.  Constitutional:      General: He is not in acute distress.    Appearance: He is well-developed.  HENT:     Head: Normocephalic and atraumatic.     Right Ear: External ear normal.     Left Ear: External ear normal.     Nose: Nose normal.  Eyes:     Extraocular Movements: Extraocular movements intact.     Conjunctiva/sclera: Conjunctivae normal.     Pupils: Pupils are equal, round, and reactive to light.  Cardiovascular:     Rate and Rhythm: Regular rhythm. Tachycardia  present.     Heart sounds: Normal heart sounds.  Pulmonary:     Effort: Pulmonary effort is normal. No respiratory distress.     Breath sounds: Normal breath sounds.  Musculoskeletal:     Cervical back: Normal range of motion and neck supple.     Right lower leg: No edema.     Left lower leg: No edema.  Skin:    General: Skin is warm and dry.  Neurological:     Mental Status: He is alert. Mental status is at baseline.  Psychiatric:        Mood and Affect: Mood normal.        Behavior: Behavior normal.     ED Results / Procedures / Treatments   Labs (all labs ordered are listed, but only abnormal results are displayed) Labs Reviewed  COMPREHENSIVE METABOLIC PANEL  BRAIN NATRIURETIC PEPTIDE    EKG EKG Interpretation  Date/Time:  Monday May 02 2022 08:38:57 EST Ventricular Rate:  166 PR Interval:    QRS Duration: 91 QT Interval:  268 QTC Calculation: 446 R Axis:   98 Text Interpretation: Supraventricular tachycardia Probable lateral infarct, age indeterminate Anteroseptal infarct, old Confirmed by Margaretmary Eddy [phone]) on 05/02/2022 8:43:50 AM  Radiology No results found.  Procedures Procedures  {Document  cardiac monitor, telemetry assessment procedure when appropriate:1}  Medications Ordered in ED Medications  adenosine (ADENOCARD) 6 MG/2ML injection 6 mg (has no administration in time range)  adenosine (ADENOCARD) 6 MG/2ML injection 12 mg (has no administration in time range)    ED Course/ Medical Decision Making/ A&P   {   Click here for ABCD2, HEART and other calculatorsREFRESH Note before signing :1}                          Medical Decision Making Amount and/or Complexity of Data Reviewed Labs: ordered.  Risk Prescription drug management.   ***  {Document critical care time when appropriate:1} {Document review of labs and clinical decision tools ie heart score, Chads2Vasc2 etc:1}  {Document your independent review of radiology images, and  any outside records:1} {Document your discussion with family members, caretakers, and with consultants:1} {Document social determinants of health affecting pt's care:1} {Document your decision making why or why not admission, treatments were needed:1} Final Clinical Impression(s) / ED Diagnoses Final diagnoses:  None    Rx / DC Orders ED Discharge Orders     None

## 2022-05-02 NOTE — ED Notes (Signed)
MD made aware of HR of 170s and EKG given to MD

## 2022-05-02 NOTE — ED Notes (Signed)
14m of Adenosine given, pt tolerated well, HR and Pulse currently both 79. NSR, EKG given to MD

## 2022-08-14 IMAGING — DX DG CHEST 1V PORT
1 series · 2 of 2 positions shown · non-contrast
Comparison: 10/19/2019

CLINICAL DATA: Palpitations, tachycardia, chest pain

EXAM:
PORTABLE CHEST 1 VIEW

[Series 1: chest ap · 0.14mm/px · 2 of 2 slices shown]
[im 1/2]
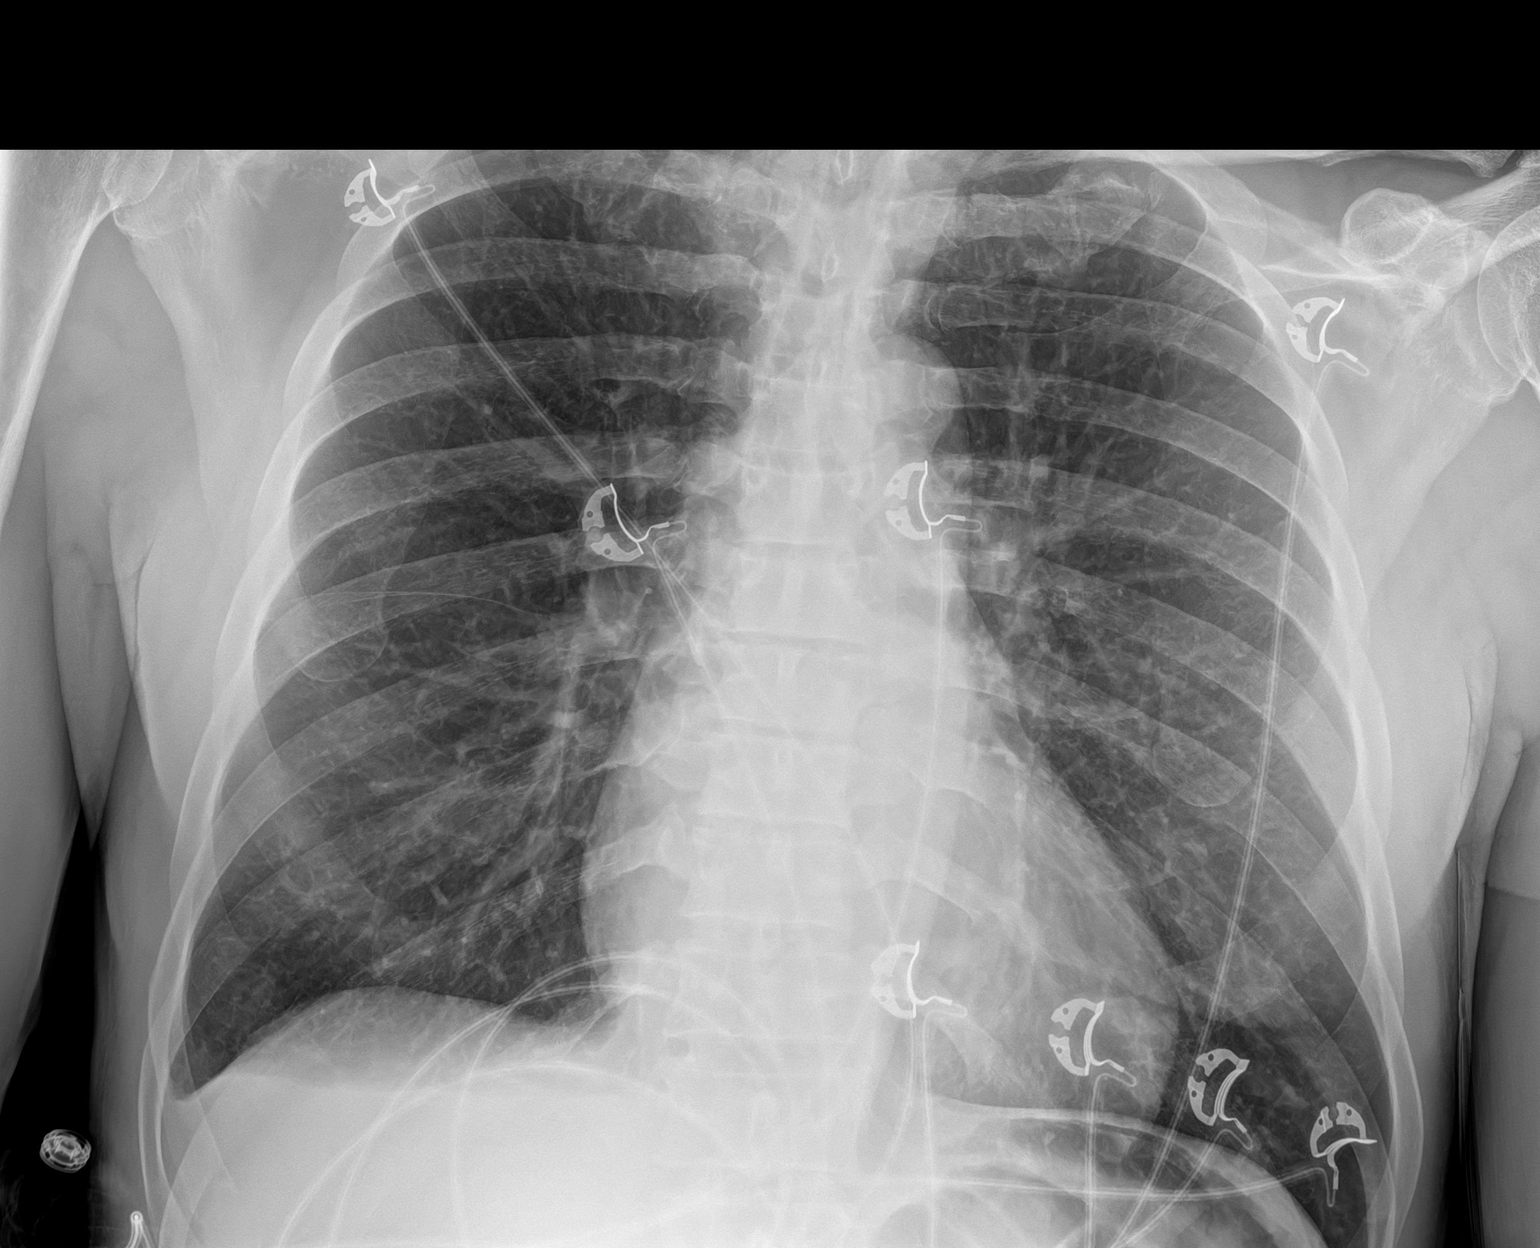
[im 2/2]
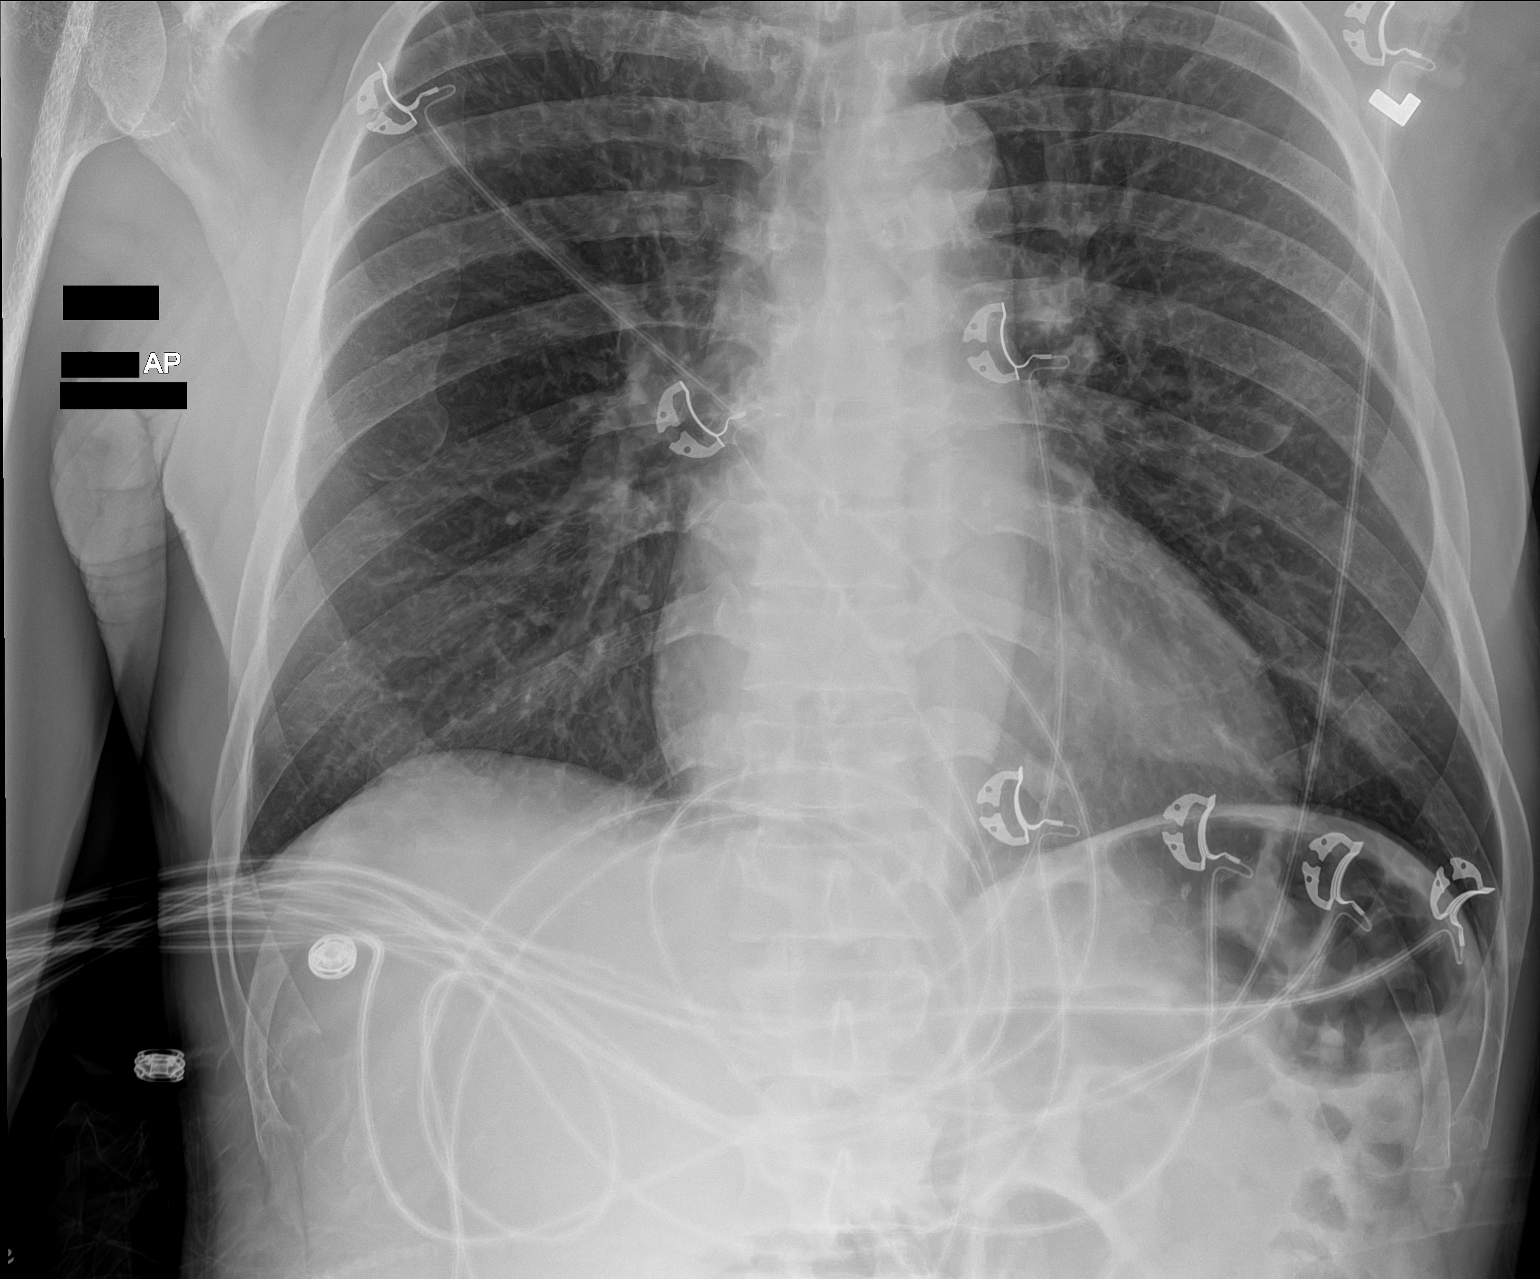

[2 of 2 positions shown; findings below may reference images not displayed]

FINDINGS: The heart size and mediastinal contours are within normal limits.
Both lungs are clear. Stable right pleural thickening at the
costophrenic angle. No pneumothorax or pleural effusion. The
visualized skeletal structures are unremarkable.
IMPRESSION: No active disease.

## 2022-10-05 IMAGING — DX DG CHEST 1V PORT
2 series · 2 of 2 positions shown · non-contrast
Comparison: 02/02/2021

CLINICAL DATA: Chest pain, palpitations

EXAM:
PORTABLE CHEST 1 VIEW

[chest ap (1 of 2)]
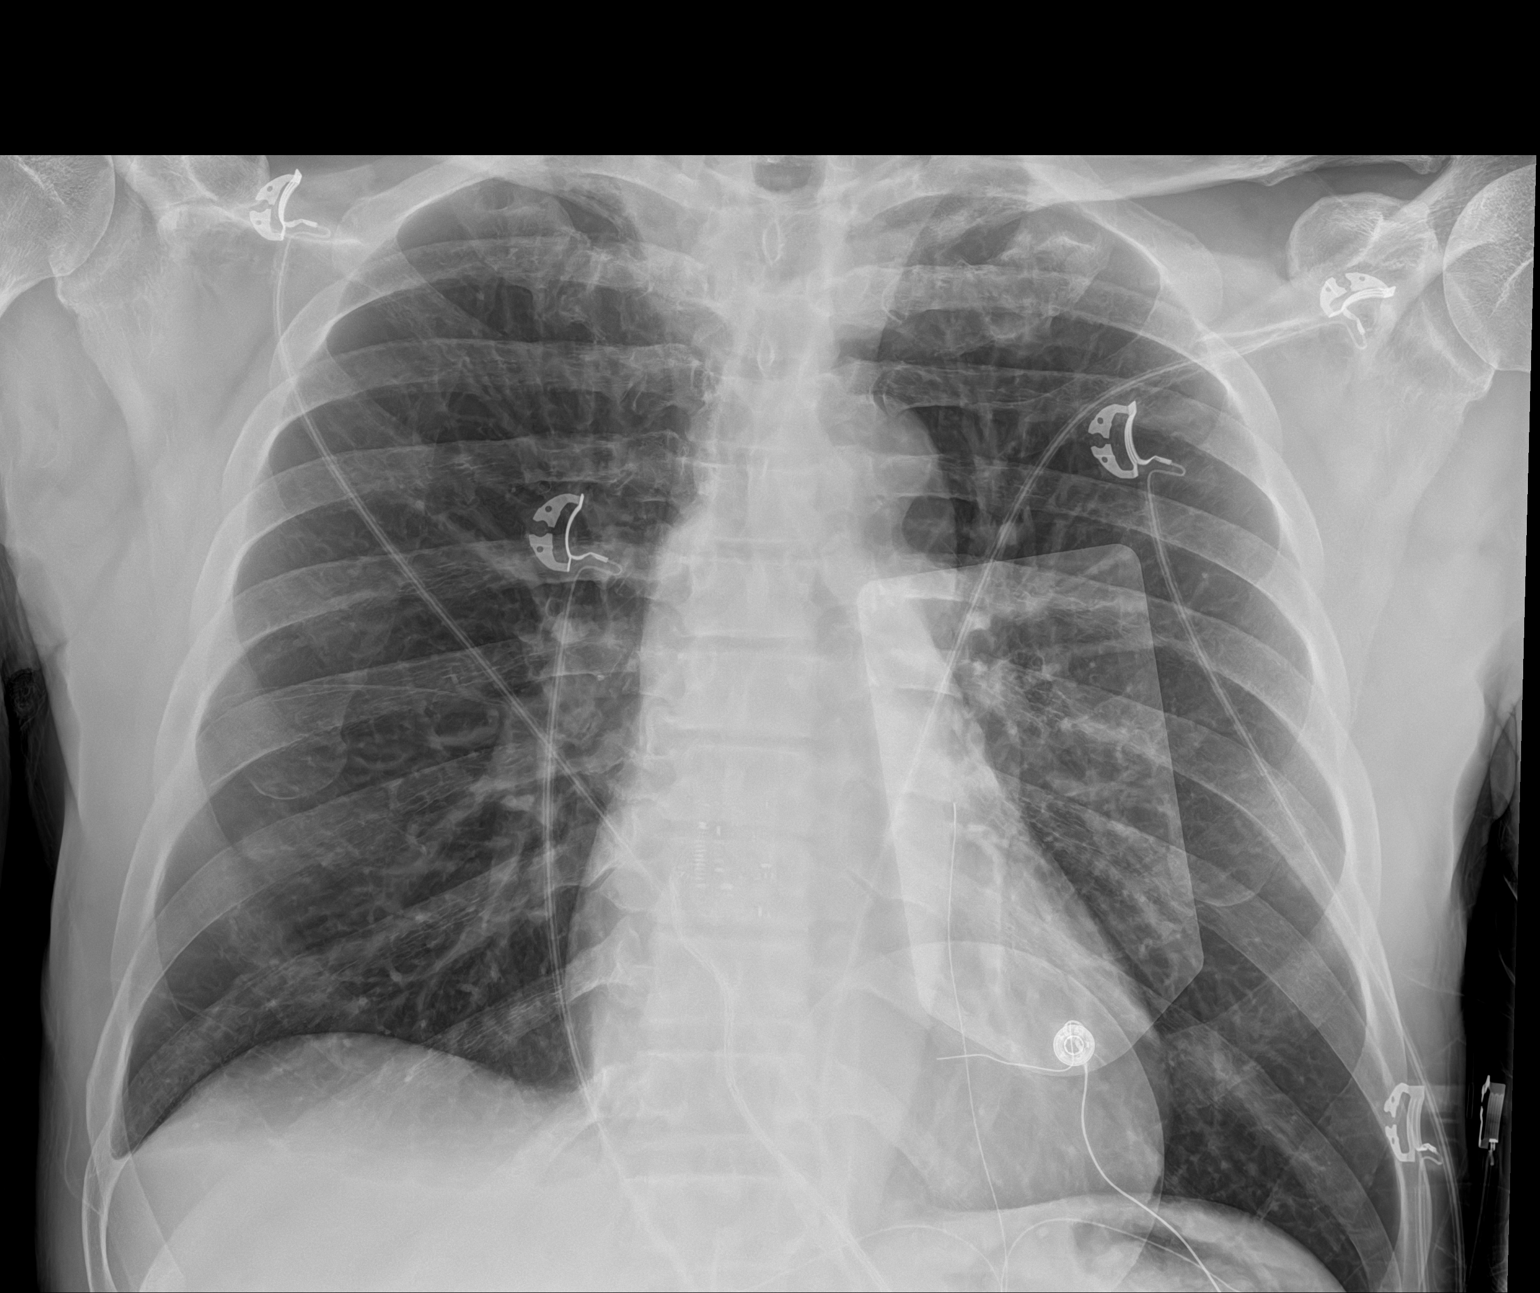

[chest ap (2 of 2)]
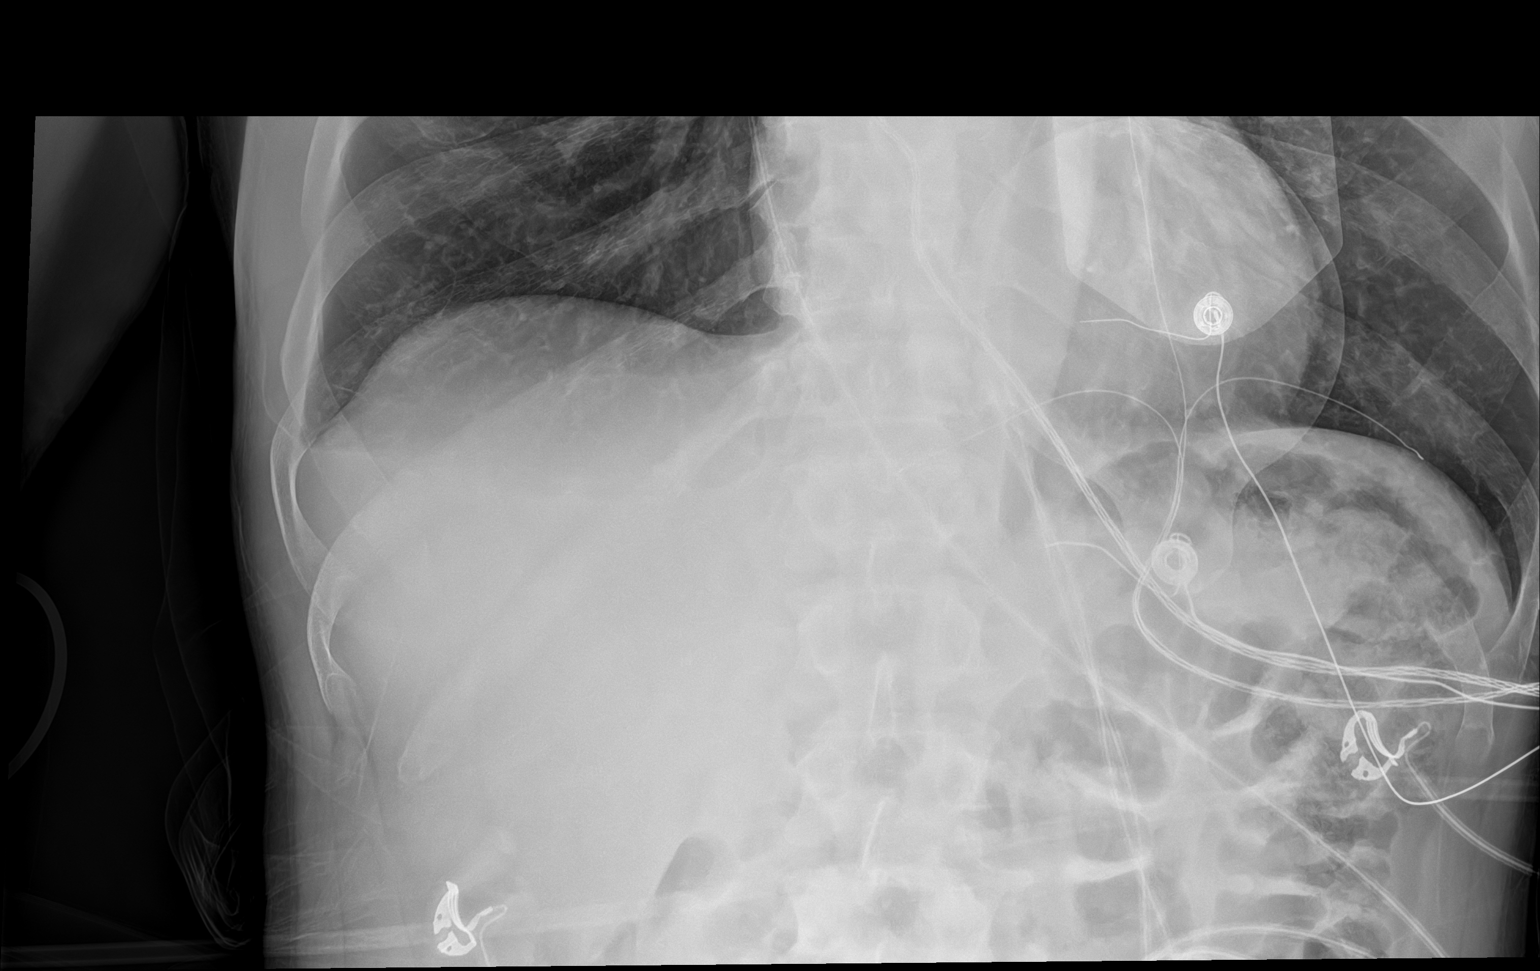

[2 of 2 positions shown; findings below may reference images not displayed]

FINDINGS: Transverse diameter of heart is slightly increased. Low position of
diaphragms may suggest COPD. There are no signs of pulmonary edema
or new focal infiltrates. There is no pleural effusion or
pneumothorax.
IMPRESSION: There are no new infiltrates or signs of pulmonary edema.

## 2022-10-26 ENCOUNTER — Emergency Department (HOSPITAL_COMMUNITY)
Admission: EM | Admit: 2022-10-26 | Discharge: 2022-10-26 | Disposition: A | Discharge status: Home / Self Care | Payer: Medicaid Other | Attending: Emergency Medicine | Admitting: Emergency Medicine

## 2022-10-26 ENCOUNTER — Encounter (HOSPITAL_COMMUNITY): Payer: Self-pay | Admitting: Emergency Medicine

## 2022-10-26 ENCOUNTER — Emergency Department (HOSPITAL_COMMUNITY): Payer: Medicaid Other

## 2022-10-26 ENCOUNTER — Other Ambulatory Visit: Payer: Self-pay

## 2022-10-26 DIAGNOSIS — Z79899 Other long term (current) drug therapy: Secondary | ICD-10-CM | POA: Diagnosis not present

## 2022-10-26 DIAGNOSIS — I10 Essential (primary) hypertension: Secondary | ICD-10-CM | POA: Diagnosis not present

## 2022-10-26 DIAGNOSIS — F17200 Nicotine dependence, unspecified, uncomplicated: Secondary | ICD-10-CM | POA: Diagnosis not present

## 2022-10-26 DIAGNOSIS — I471 Supraventricular tachycardia, unspecified: Secondary | ICD-10-CM | POA: Diagnosis not present

## 2022-10-26 DIAGNOSIS — R000 Tachycardia, unspecified: Secondary | ICD-10-CM | POA: Diagnosis present

## 2022-10-26 LAB — CBC WITH DIFFERENTIAL/PLATELET
Abs Immature Granulocytes: 0.01 10*3/uL (ref 0.00–0.07)
Basophils Absolute: 0 10*3/uL (ref 0.0–0.1)
Basophils Relative: 1 %
Eosinophils Absolute: 0.1 10*3/uL (ref 0.0–0.5)
Eosinophils Relative: 1 %
HCT: 33.8 % — ABNORMAL LOW (ref 39.0–52.0)
Hemoglobin: 11.3 g/dL — ABNORMAL LOW (ref 13.0–17.0)
Immature Granulocytes: 0 %
Lymphocytes Relative: 36 %
Lymphs Abs: 2.2 10*3/uL (ref 0.7–4.0)
MCH: 29.6 pg (ref 26.0–34.0)
MCHC: 33.4 g/dL (ref 30.0–36.0)
MCV: 88.5 fL (ref 80.0–100.0)
Monocytes Absolute: 0.5 10*3/uL (ref 0.1–1.0)
Monocytes Relative: 9 %
Neutro Abs: 3.2 10*3/uL (ref 1.7–7.7)
Neutrophils Relative %: 53 %
Platelets: 172 10*3/uL (ref 150–400)
RBC: 3.82 MIL/uL — ABNORMAL LOW (ref 4.22–5.81)
RDW: 12.6 % (ref 11.5–15.5)
WBC: 6 10*3/uL (ref 4.0–10.5)
nRBC: 0 % (ref 0.0–0.2)

## 2022-10-26 LAB — BASIC METABOLIC PANEL
Anion gap: 12 (ref 5–15)
BUN: 20 mg/dL (ref 6–20)
CO2: 19 mmol/L — ABNORMAL LOW (ref 22–32)
Calcium: 8.9 mg/dL (ref 8.9–10.3)
Chloride: 103 mmol/L (ref 98–111)
Creatinine, Ser: 0.89 mg/dL (ref 0.61–1.24)
GFR, Estimated: >60 mL/min (ref >60)
Glucose, Bld: 137 mg/dL — ABNORMAL HIGH (ref 70–99)
Potassium: 3.6 mmol/L (ref 3.5–5.1)
Sodium: 134 mmol/L — ABNORMAL LOW (ref 135–145)

## 2022-10-26 MED ORDER — SODIUM CHLORIDE 0.9 % IV SOLN: INTRAVENOUS | Status: DC

## 2022-10-26 MED ORDER — ADENOSINE 6 MG/2ML IV SOLN
6.0000 mg | Freq: Once | INTRAVENOUS | Status: AC
  Administered 2022-10-26: 6 mg via INTRAVENOUS
  Filled 2022-10-26: qty 2

## 2022-10-26 NOTE — ED Triage Notes (Signed)
Pt in c/o intermittent fast HR, pt c/o feeling lightheaded, pt hypotensive in triage, HR 180s, BP 80s sys, Zackowski, MD and Charge RN notified

## 2022-10-26 NOTE — Discharge Instructions (Signed)
Make an appointment to follow-up with cardiology.  Return for any rapid heart rate that does not stop within 40 minutes.

## 2022-10-26 NOTE — Progress Notes (Signed)
Called to ED for patient.  Patient HR when entering was in the 170s and 180s.  Adenosine was given, patient's HR started coming down after treatment.  Patient was already in low 90s when I left room, patient did well throughout treatment, EtCO2 was being monitored throughout procedure.  RN at bedside.

## 2022-10-26 NOTE — ED Provider Notes (Addendum)
[place] EMERGENCY DEPARTMENT AT [facility] Provider Note   CSN: [id number] Arrival date & time: 10/26/22  2039     History  Chief Complaint  Patient presents with   Tachycardia    Jim Gordon is a 57 y.o. male.  Patient present with rapid heart rate.  He thinks it started today but it could have been on and off prior to the day.  Patient seen frequently for supraventricular tachycardia patient actually seen twice in 2023 and then so far this year has been seen 3 times.  This would actually be the fourth time.  Patient states she has got some mild chest discomfort with it.  Patient's blood pressure here was 150 systolic.  They got 86 out in triage but I feel that this not exactly accurate.  Patient has not followed up with cardiology.  Past medical history sniffing for every day smoker Lupus supraventricular tachycardia hypertension alcohol abuse.       Home Medications Prior to Admission medications   Medication Sig Start Date End Date Taking? Authorizing Provider  ibuprofen (ADVIL,MOTRIN) 600 MG tablet Take 1 tablet (600 mg total) by mouth every 6 (six) hours as needed. Patient not taking: Reported on 05/02/2022 04/02/18   Burgess Amor, PA-C  metoprolol succinate (TOPROL-XL) 25 MG 24 hr tablet Take 1 tablet (25 mg total) by mouth daily. 05/02/22 06/01/22  Rondel Baton, MD      Allergies    Bee venom and Penicillins    Review of Systems   Review of Systems  Constitutional:  Negative for chills and fever.  HENT:  Negative for ear pain and sore throat.   Eyes:  Negative for pain and visual disturbance.  Respiratory:  Positive for shortness of breath. Negative for cough.   Cardiovascular:  Positive for chest pain and palpitations.  Gastrointestinal:  Negative for abdominal pain and vomiting.  Genitourinary:  Negative for dysuria and hematuria.  Musculoskeletal:  Negative for arthralgias and back pain.  Skin:  Negative for color change and rash.   Neurological:  Negative for seizures and syncope.  All other systems reviewed and are negative.   Physical Exam Updated Vital Signs BP 116/87   Pulse (!) 178   Temp 97.6 F (36.4 C) (Oral)   Resp 17   SpO2 99%  Physical Exam Vitals and nursing note reviewed.  Constitutional:      General: He is not in acute distress.    Appearance: Normal appearance. He is well-developed.  HENT:     Head: Normocephalic and atraumatic.  Eyes:     Extraocular Movements: Extraocular movements intact.     Conjunctiva/sclera: Conjunctivae normal.     Pupils: Pupils are equal, round, and reactive to light.  Cardiovascular:     Rate and Rhythm: Regular rhythm. Tachycardia present.     Heart sounds: No murmur heard. Pulmonary:     Effort: Pulmonary effort is normal. No respiratory distress.     Breath sounds: Normal breath sounds. No wheezing, rhonchi or rales.  Abdominal:     Palpations: Abdomen is soft.     Tenderness: There is no abdominal tenderness.  Musculoskeletal:        General: No swelling.     Cervical back: Normal range of motion and neck supple.  Skin:    General: Skin is warm and dry.     Capillary Refill: Capillary refill takes less than 2 seconds.  Neurological:     General: No focal deficit present.  Mental Status: He is alert and oriented to person, place, and time.  Psychiatric:        Mood and Affect: Mood normal.     ED Results / Procedures / Treatments   Labs (all labs ordered are listed, but only abnormal results are displayed) Labs Reviewed  CBC WITH DIFFERENTIAL/PLATELET  BASIC METABOLIC PANEL    EKG EKG Interpretation Date/Time:  Wednesday October 26 2022 21:14:07 EDT Ventricular Rate:  181 PR Interval:    QRS Duration:  78 QT Interval:  260 QTC Calculation: 451 R Axis:   79  Text Interpretation: Supraventricular tachycardia ST & T wave abnormality, consider inferolateral ischemia Abnormal ECG When compared with ECG of 02-May-2022 09:21, PREVIOUS  ECG IS PRESENT Confirmed by Vanetta Mulders [phone]) on 10/26/2022 9:37:10 PM  Radiology No results found.  Procedures Procedures    Medications Ordered in ED Medications  0.9 %  sodium chloride infusion ( Intravenous New Bag/Given 10/26/22 2139)  adenosine (ADENOCARD) 6 MG/2ML injection 6 mg (6 mg Intravenous Given 10/26/22 2145)    ED Course/ Medical Decision Making/ A&P                                 Medical Decision Making Amount and/or Complexity of Data Reviewed Labs: ordered. Radiology: ordered.  Risk Prescription drug management.   CRITICAL CARE Performed by: Vanetta Mulders Total critical care time: 40 minutes Critical care time was exclusive of separately billable procedures and treating other patients. Critical care was necessary to treat or prevent imminent or life-threatening deterioration. Critical care was time spent personally by me on the following activities: development of treatment plan with patient and/or surrogate as well as nursing, discussions with consultants, evaluation of patient's response to treatment, examination of patient, obtaining history from patient or surrogate, ordering and performing treatments and interventions, ordering and review of laboratory studies, ordering and review of radiographic studies, pulse oximetry and re-evaluation of patient's condition.  Patient is EKG consistent with supraventricular tachycardia with heart rate around 188.  Patient tolerating it fairly well.  Patient received 6 mg of adenosine placed on 2 L of oxygen ZOLL monitoring was done as well.  Patient promptly converted.  Currently now in sinus rhythm.  CBC basic metabolic panel and chest x-ray are pending will observe to make sure he remains in sinus.  Patient's converted EKG looks like his got a left bundle branch block.  Follow-up cardiology will be important.  Patient has remained in sinus rhythm.  Stable for discharge.  Labs CBC white count 6 hemoglobin  11.3 platelets good at 172 basic metabolic panel without significant abnormalities glucose 137 renal function normal.  Portable chest x-ray without any acute findings.  Final Clinical Impression(s) / ED Diagnoses Final diagnoses:  SVT (supraventricular tachycardia)    Rx / DC Orders ED Discharge Orders     None         Vanetta Mulders, MD 10/26/22 4098    Vanetta Mulders, MD 10/26/22 1191    Vanetta Mulders, MD 10/26/22 2308

## 2022-10-28 ENCOUNTER — Telehealth: Payer: Self-pay | Admitting: Cardiology

## 2022-10-28 NOTE — Telephone Encounter (Signed)
Returned call, got machine

## 2022-10-28 NOTE — Telephone Encounter (Signed)
I was messaged by call center that patient was on the line and immediately accepted call. I was then told patient had hung up.

## 2022-10-28 NOTE — Telephone Encounter (Signed)
Attempt to reach again, got machine. Follow up apt made ,12/01/22 at 3:30 pm with B.Strader,PA-C

## 2022-10-28 NOTE — Telephone Encounter (Signed)
Pt returning nurse's call. Please advise

## 2022-10-28 NOTE — Telephone Encounter (Signed)
Pt would like a callback from nurses regarding hospital f/u and medications. Please advise

## 2022-10-28 NOTE — Telephone Encounter (Signed)
Needs f/u apt-has not followed up since 05/2021  Seen in ED for SVT  Is taking Toprol xl 25 mg every day   Left message to return call.

## 2022-10-28 NOTE — Telephone Encounter (Signed)
Patient is returning call.  °

## 2022-11-01 ENCOUNTER — Telehealth: Payer: Self-pay | Admitting: Internal Medicine

## 2022-11-01 MED ORDER — METOPROLOL SUCCINATE ER 25 MG PO TB24: 25.0000 mg | ORAL_TABLET | Freq: Every day | ORAL | 0 refills | Status: DC

## 2022-11-01 NOTE — Telephone Encounter (Signed)
*  STAT* If patient is at the pharmacy, call can be transferred to refill team.   1. Which medications need to be refilled? (please list name of each medication and dose if known)   metoprolol succinate (TOPROL-XL) 25 MG 24 hr tablet       4. Which pharmacy/location (including street and city if local pharmacy) is medication to be sent to? [id number] - [place], [place] - [address]     5. Do they need a 30 day or 90 day supply? 90

## 2022-11-01 NOTE — Telephone Encounter (Signed)
Completed.

## 2022-11-29 ENCOUNTER — Other Ambulatory Visit: Payer: Self-pay | Admitting: Internal Medicine

## 2022-12-01 ENCOUNTER — Encounter: Payer: Self-pay | Admitting: Student

## 2022-12-01 ENCOUNTER — Ambulatory Visit: Payer: Medicaid Other | Attending: Student | Admitting: Student

## 2022-12-01 VITALS — BP 118/76 | HR 66 | Ht 73.5 in | Wt 174.6 lb

## 2022-12-01 DIAGNOSIS — F1010 Alcohol abuse, uncomplicated: Secondary | ICD-10-CM | POA: Diagnosis not present

## 2022-12-01 DIAGNOSIS — I471 Supraventricular tachycardia, unspecified: Secondary | ICD-10-CM | POA: Diagnosis not present

## 2022-12-01 DIAGNOSIS — I2510 Atherosclerotic heart disease of native coronary artery without angina pectoris: Secondary | ICD-10-CM

## 2022-12-01 DIAGNOSIS — Z720 Tobacco use: Secondary | ICD-10-CM | POA: Diagnosis not present

## 2022-12-01 MED ORDER — METOPROLOL SUCCINATE ER 25 MG PO TB24: 25.0000 mg | ORAL_TABLET | Freq: Every day | ORAL | 3 refills | Status: DC

## 2022-12-01 NOTE — Patient Instructions (Addendum)
Medication Instructions:  Your physician recommends that you continue on your current medications as directed. Please refer to the Current Medication list given to you today.  *If you need a refill on your cardiac medications before your next appointment, please call your pharmacy*   Lab Work: NONE   If you have labs (blood work) drawn today and your tests are completely normal, you will receive your results only by: MyChart Message (if you have MyChart) OR A paper copy in the mail If you have any lab test that is abnormal or we need to change your treatment, we will call you to review the results.   Testing/Procedures: Your physician has requested that you have an echocardiogram. Echocardiography is a painless test that uses sound waves to create images of your heart. It provides your doctor with information about the size and shape of your heart and how well your heart's chambers and valves are working. This procedure takes approximately one hour. There are no restrictions for this procedure. Please do NOT wear cologne, perfume, aftershave, or lotions (deodorant is allowed). Please arrive 15 minutes prior to your appointment time.    Follow-Up: At [facility], you and your health needs are our priority.  As part of our continuing mission to provide you with exceptional heart care, we have created designated Provider Care Teams.  These Care Teams include your primary Cardiologist (physician) and Advanced Practice Providers (APPs -  Physician Assistants and Nurse Practitioners) who all work together to provide you with the care you need, when you need it.  We recommend signing up for the patient portal called "MyChart".  Sign up information is provided on this After Visit Summary.  MyChart is used to connect with patients for Virtual Visits (Telemedicine).  Patients are able to view lab/test results, encounter notes, upcoming appointments, etc.  Non-urgent messages can be sent to  your provider as well.   To learn more about what you can do with MyChart, go to [email or web address].    Your next appointment:   3 -4 month(s)  Provider:   Nona Dell, MD or Randall An, PA-C    Other Instructions Thank you for choosing [facility]!

## 2022-12-01 NOTE — Progress Notes (Signed)
Cardiology Office Note    Date:  12/01/2022  ID:  Jim Gordon, DOB 1966-01-21, MRN [id number] Cardiologist: Nona Dell, MD    History of Present Illness:    Jim Gordon is a 57 y.o. male with past medical history of coronary calcifications by prior CT, SVT, discoid lupus, alcohol use and tobacco use who presents to the office today for follow-up from a recent Emergency Department visit.  He was last examined by Dr. Izora Ribas in 05/2021 and reported being without all of his medications at that time. He denied any specific anginal symptoms. He was restarted on Lisinopril 10 mg daily along with ASA 81 mg daily. He was encouraged to reestablish with a PCP and to follow-up in 6 months but has not been evaluated by Cardiology since.   He most recently presented to [facility] ED on 10/26/2022 for evaluation of palpitations and dizziness. He was found to be in SVT with heart rate in the 180's. He received 6 mg of Adenosine and converted back to normal sinus rhythm. By review of notes, he had similar presentations in the past with one episode occurring in 01/2022 and he spontaneously converted back to normal sinus rhythm without administration of Adenosine. He also had recurrence in 03/2022 and converted with Adenosine but had recurrence and required Cardizem. This also occurred in 04/2022 with conversion to NSR with Adenosine.  In talking with the patient today, he reports his episodes of palpitations are sometimes triggered by exertion or when he is in an argument or verbal altercation. Says his symptoms have overall been well-controlled since being started on Toprol-XL 25 mg daily. Says he has been without this a few days this week but has otherwise been compliant with the medication. He denies any recent exertional chest pain or dyspnea on exertion. No recent orthopnea, PND or pitting edema. He denies any recreational drug use but does report smoking at least 1 pack/day and consumes anywhere  from 6-18 beers on a daily basis.  Studies Reviewed:   EKG: EKG is ordered today and demonstrates:   EKG Interpretation Date/Time:  Thursday December 01 2022 16:19:51 EDT Ventricular Rate:  70 PR Interval:  188 QRS Duration:  94 QT Interval:  438 QTC Calculation: 473 R Axis:   88  Text Interpretation: Normal sinus rhythm Minimal voltage criteria for LVH, may be normal variant ( Sokolow-Lyon ) Confirmed by Randall An ([id number]) on 12/01/2022 4:43:10 PM       Echocardiogram: 09/2017 Study Conclusions   - Left ventricle: The cavity size was normal. Wall thickness was    increased in a pattern of moderate LVH. Systolic function was    normal. The estimated ejection fraction was in the range of 60%    to 65%. Wall motion was normal; there were no regional wall    motion abnormalities. Indeterminate diastolic function.  - Aortic valve: Trileaflet; mildly thickened leaflets.  - Right atrium: Central venous pressure (est): 3 mm Hg.  - Atrial septum: No defect or patent foramen ovale was identified.  - Tricuspid valve: There was trivial regurgitation.  - Pulmonary arteries: Systolic pressure could not be accurately    estimated.  - Pericardium, extracardiac: There was no pericardial effusion.    Physical Exam:   VS:  BP 118/76   Pulse 66   Ht 6' 1.5" (1.867 m)   Wt 174 lb 9.6 oz (79.2 kg)   SpO2 97%   BMI 22.72 kg/m    Wt Readings from Last 3  Encounters:  12/01/22 174 lb 9.6 oz (79.2 kg)  05/02/22 179 lb (81.2 kg)  04/16/22 179 lb (81.2 kg)     GEN: Well nourished, well developed male appearing in no acute distress NECK: No JVD; No carotid bruits CARDIAC: RRR, no murmurs, rubs, gallops RESPIRATORY:  Clear to auscultation without rales, wheezing or rhonchi  ABDOMEN: Appears non-distended. No obvious abdominal masses. EXTREMITIES: No clubbing or cyanosis. No pitting edema.  Distal pedal pulses are 2+ bilaterally.   Assessment and Plan:   1. SVT - This has been  a recurrent issue for the patient over the past several years. We reviewed the importance of reducing his alcohol and caffeine consumption. K+, magnesium and TSH have been checked during prior Emergency Department visits and within normal limits at that time. Will obtain an updated echocardiogram to assess for any structural abnormalities.  - His symptoms have been well-controlled since being compliant with Toprol-XL 25 mg daily. For now, we will continue current medical therapy. I encouraged him to make [place] aware if he has recurrent symptoms as this can be further titrated if needed. We also discussed possible referral to EP for consideration of ablation but he is not interested in procedures at this time.   2. Coronary Calcification by prior CT - He did have coronary calcification by prior chest CT in 2017. He has not had recent screening labs for HLD or Type 2 DM. He was encouraged to establish care with a PCP as he does not currently have one. If this is not in place at the time of his next visit, would recommend checking an FLP and Hgb A1c for risk stratification as he would likely benefit from initiation of statin therapy.   3. Alcohol Abuse - He does consume at least 6-18 beers on a daily basis. The importance of reduction and cessation was reviewed. At this time, it sounds like he plans to continue his current level of consumption.  4. Tobacco Use - He does smoke approximately 1 pack/day. Cessation advised.   Signed, Ellsworth Lennox, PA-C

## 2023-01-04 ENCOUNTER — Ambulatory Visit (HOSPITAL_COMMUNITY)
Admission: RE | Admit: 2023-01-04 | Discharge: 2023-01-04 | Disposition: A | Discharge status: Home / Self Care | Payer: Medicaid Other | Source: Ambulatory Visit | Attending: Student | Admitting: Student

## 2023-01-04 DIAGNOSIS — I471 Supraventricular tachycardia, unspecified: Secondary | ICD-10-CM | POA: Insufficient documentation

## 2023-01-04 DIAGNOSIS — I509 Heart failure, unspecified: Secondary | ICD-10-CM

## 2023-01-04 LAB — ECHOCARDIOGRAM COMPLETE
AR max vel: 2.46 cm2
AV Area VTI: 3.21 cm2
AV Area mean vel: 2.49 cm2
AV Mean grad: 2.7 mm[Hg]
AV Peak grad: 5.1 mm[Hg]
Ao pk vel: 1.13 m/s
Area-P 1/2: 2.42 cm2
S' Lateral: 2.8 cm

## 2023-01-04 NOTE — Progress Notes (Signed)
*  PRELIMINARY RESULTS* Echocardiogram 2D Echocardiogram has been performed.  [name] 01/04/2023, 12:13 PM

## 2023-01-16 ENCOUNTER — Other Ambulatory Visit: Payer: Self-pay | Admitting: Internal Medicine

## 2023-03-02 ENCOUNTER — Ambulatory Visit: Payer: Medicaid Other | Attending: Student | Admitting: Student

## 2023-03-02 ENCOUNTER — Encounter: Payer: Self-pay | Admitting: Student

## 2023-03-02 VITALS — BP 124/72 | HR 62 | Ht 73.5 in | Wt 178.0 lb

## 2023-03-02 DIAGNOSIS — Z720 Tobacco use: Secondary | ICD-10-CM

## 2023-03-02 DIAGNOSIS — Z131 Encounter for screening for diabetes mellitus: Secondary | ICD-10-CM

## 2023-03-02 DIAGNOSIS — F1010 Alcohol abuse, uncomplicated: Secondary | ICD-10-CM

## 2023-03-02 DIAGNOSIS — Z13220 Encounter for screening for lipoid disorders: Secondary | ICD-10-CM | POA: Diagnosis not present

## 2023-03-02 DIAGNOSIS — I471 Supraventricular tachycardia, unspecified: Secondary | ICD-10-CM

## 2023-03-02 DIAGNOSIS — I2510 Atherosclerotic heart disease of native coronary artery without angina pectoris: Secondary | ICD-10-CM

## 2023-03-02 NOTE — Progress Notes (Signed)
Cardiology Office Note    Date:  03/02/2023  ID:  Jim Gordon, DOB [birth year], MRN [id number] Cardiologist: Nona Dell, MD    History of Present Illness:    Jim Gordon is a 57 y.o. male with past medical history of coronary calcifications by prior CT, SVT, discoid lupus, alcohol use and tobacco use who presents to the office today for 76-month follow-up.   He was examined by myself in 11/2022 and had recently been evaluated in the ED multiple times for SVT. During one episode he required Adenosine and during another episode he spontaneously converted back to normal sinus rhythm. At the time of follow-up, he felt like some of his palpitations were triggered by exertion or during a verbal altercation with other people. He had been started on Toprol-XL 25 mg daily but had been without the medication for several days. He was still smoking approximately 1 pack/day and consuming 6-18 beers on a daily basis. The importance of reducing his alcohol and caffeine consumption was reviewed and a follow-up echocardiogram was recommended to assess for any structural abnormalities. Symptoms had been well-controlled since being on Toprol-XL and he was continued on this and the possibility of EP referral for consideration of ablation was reviewed but he was not interested in procedures at that time. Echocardiogram in 12/2022 showed a preserved EF of 65 to 70% with no regional wall motion abnormalities. He did have moderate LVH, grade 1 diastolic dysfunction, normal RV function and no significant valve abnormalities.  In talking with the patient today, he reports overall doing well since his last office visit. He reports occasional, brief palpitations but nothing resembling his prior episodes of SVT which led to ED evaluation. He reports compliance with Toprol-XL 25 mg daily and has been taking this consistently. He denies any recent chest pain or progressive dyspnea on exertion. No specific orthopnea, PND or  pitting edema. He has tried to reduce his tobacco use and says 1 pack can symptoms last 2 to 3 days and he has also reduced his beer consumption and typically consumes less than 12 beers per day and only has 1-2 beers per day on the days that he works.  Studies Reviewed:   EKG: EKG is not ordered today  Echocardiogram: 12/2022 IMPRESSIONS     1. Left ventricular ejection fraction, by estimation, is 65 to 70%. The  left ventricle has normal function. The left ventricle has no regional  wall motion abnormalities. There is moderate left ventricular hypertrophy.  Left ventricular diastolic  parameters are consistent with Grade I diastolic dysfunction (impaired  relaxation).   2. Right ventricular systolic function is normal. The right ventricular  size is normal. Tricuspid regurgitation signal is inadequate for assessing  PA pressure.   3. The mitral valve is normal in structure. No evidence of mitral valve  regurgitation. No evidence of mitral stenosis.   4. The aortic valve is tricuspid. Aortic valve regurgitation is not  visualized. No aortic stenosis is present.   5. The inferior vena cava is normal in size with greater than 50%  respiratory variability, suggesting right atrial pressure of 3 mmHg.   Physical Exam:   VS:  BP 124/72 (BP Location: Left Arm, Cuff Size: Normal)   Pulse 62   Ht 6' 1.5" (1.867 m)   Wt 178 lb (80.7 kg)   SpO2 95%   BMI 23.17 kg/m    Wt Readings from Last 3 Encounters:  03/02/23 178 lb (80.7 kg)  12/01/22 174 lb  9.6 oz (79.2 kg)  05/02/22 179 lb (81.2 kg)     GEN: Well nourished, well developed male appearing in no acute distress NECK: No JVD; No carotid bruits CARDIAC: RRR, no murmurs, rubs, gallops RESPIRATORY:  Clear to auscultation without rales, wheezing or rhonchi  ABDOMEN: Appears non-distended. No obvious abdominal masses. EXTREMITIES: No clubbing or cyanosis. No pitting edema.  Distal pedal pulses are 2+ bilaterally.   Assessment and  Plan:   1. SVT - Symptoms have overall been well-controlled since he has been compliant with Toprol-XL 25 mg daily and he has also reduced his alcohol use. He was previously not interested in referral to EP for ablation. Will recheck a TSH and Mg with upcoming labs. Will also check a CBC given that hemoglobin was low at 11.3 when checked in 10/2022. Will recheck renal function and electrolytes as he had mild hyponatremia with Na+ at 134 in 10/2022.  2. Coronary Calcification by CT/Screening for Lipid Disorder and Diabetes - This was noted on prior imaging. He does remain physically active for his job and denies any recent anginal symptoms. Will recheck an FLP and Hgb A1c for risk stratification with upcoming labs as he does not have a PCP.  3. Alcohol Abuse - He has reduced his intake of beer to 1-2 beers on the days that he works but still binge drinks on days off.  He was encouraged to limit alcohol intake to less than 2 drinks per day.  4. Tobacco Use - He has reduced his use to less than 0.5 ppd. Congratulated on his reduction with cessation advised.   Signed, Ellsworth Lennox, PA-C

## 2023-03-02 NOTE — Patient Instructions (Signed)
Medication Instructions:   Continue current medication regimen.   *If you need a refill on your cardiac medications before your next appointment, please call your pharmacy*   Lab Work:  Return for labs when you are fasting (have not ate for at least 8 hours). Can come to [facility] (Main Entrance) between 8:00 AM and 3:30 PM.   If you have labs (blood work) drawn today and your tests are completely normal, you will receive your results only by: MyChart Message (if you have MyChart) OR A paper copy in the mail If you have any lab test that is abnormal or we need to change your treatment, we will call you to review the results.   Follow-Up: At [facility], you and your health needs are our priority.  As part of our continuing mission to provide you with exceptional heart care, we have created designated Provider Care Teams.  These Care Teams include your primary Cardiologist (physician) and Advanced Practice Providers (APPs -  Physician Assistants and Nurse Practitioners) who all work together to provide you with the care you need, when you need it.  We recommend signing up for the patient portal called "MyChart".  Sign up information is provided on this After Visit Summary.  MyChart is used to connect with patients for Virtual Visits (Telemedicine).  Patients are able to view lab/test results, encounter notes, upcoming appointments, etc.  Non-urgent messages can be sent to your provider as well.   To learn more about what you can do with MyChart, go to [email or web address].    Your next appointment:   6 month(s)  Provider:   You may see Nona Dell, MD or one of the following Advanced Practice Providers on your designated Care Team:   [place], PA-C  Jacolyn Reedy, [place]

## 2023-09-06 ENCOUNTER — Ambulatory Visit: Payer: Medicaid Other | Attending: Cardiology | Admitting: Cardiology

## 2023-09-06 ENCOUNTER — Encounter: Payer: Self-pay | Admitting: Cardiology

## 2023-09-30 ENCOUNTER — Encounter (HOSPITAL_COMMUNITY): Payer: Self-pay

## 2023-09-30 ENCOUNTER — Emergency Department (HOSPITAL_COMMUNITY)
Admission: EM | Admit: 2023-09-30 | Discharge: 2023-09-30 | Disposition: A | Discharge status: Home / Self Care | Attending: Emergency Medicine | Admitting: Emergency Medicine

## 2023-09-30 ENCOUNTER — Other Ambulatory Visit: Payer: Self-pay

## 2023-09-30 DIAGNOSIS — I10 Essential (primary) hypertension: Secondary | ICD-10-CM | POA: Diagnosis not present

## 2023-09-30 DIAGNOSIS — Z79899 Other long term (current) drug therapy: Secondary | ICD-10-CM | POA: Diagnosis not present

## 2023-09-30 DIAGNOSIS — R000 Tachycardia, unspecified: Secondary | ICD-10-CM | POA: Diagnosis present

## 2023-09-30 DIAGNOSIS — I471 Supraventricular tachycardia, unspecified: Secondary | ICD-10-CM | POA: Diagnosis not present

## 2023-09-30 DIAGNOSIS — F17210 Nicotine dependence, cigarettes, uncomplicated: Secondary | ICD-10-CM | POA: Diagnosis not present

## 2023-09-30 LAB — BASIC METABOLIC PANEL WITH GFR
Anion gap: 14 (ref 5–15)
BUN: 14 mg/dL (ref 6–20)
CO2: 18 mmol/L — ABNORMAL LOW (ref 22–32)
Calcium: 8.9 mg/dL (ref 8.9–10.3)
Chloride: 104 mmol/L (ref 98–111)
Creatinine, Ser: 0.96 mg/dL (ref 0.61–1.24)
GFR, Estimated: >60 mL/min (ref >60)
Glucose, Bld: 92 mg/dL (ref 70–99)
Potassium: 4.3 mmol/L (ref 3.5–5.1)
Sodium: 136 mmol/L (ref 135–145)

## 2023-09-30 LAB — CBC WITH DIFFERENTIAL/PLATELET
Abs Immature Granulocytes: 0.01 K/uL (ref 0.00–0.07)
Basophils Absolute: 0 K/uL (ref 0.0–0.1)
Basophils Relative: 1 %
Eosinophils Absolute: 0.1 K/uL (ref 0.0–0.5)
Eosinophils Relative: 2 %
HCT: 43.3 % (ref 39.0–52.0)
Hemoglobin: 14.5 g/dL (ref 13.0–17.0)
Immature Granulocytes: 0 %
Lymphocytes Relative: 46 %
Lymphs Abs: 2.4 K/uL (ref 0.7–4.0)
MCH: 30.1 pg (ref 26.0–34.0)
MCHC: 33.5 g/dL (ref 30.0–36.0)
MCV: 89.8 fL (ref 80.0–100.0)
Monocytes Absolute: 0.4 K/uL (ref 0.1–1.0)
Monocytes Relative: 7 %
Neutro Abs: 2.3 K/uL (ref 1.7–7.7)
Neutrophils Relative %: 44 %
Platelets: 185 K/uL (ref 150–400)
RBC: 4.82 MIL/uL (ref 4.22–5.81)
RDW: 12.6 % (ref 11.5–15.5)
WBC: 5.2 K/uL (ref 4.0–10.5)
nRBC: 0 % (ref 0.0–0.2)

## 2023-09-30 MED ORDER — SODIUM CHLORIDE 0.9 % IV BOLUS
1000.0000 mL | Freq: Once | INTRAVENOUS | Status: AC
  Administered 2023-09-30: 1000 mL via INTRAVENOUS

## 2023-09-30 MED ORDER — ADENOSINE 6 MG/2ML IV SOLN
INTRAVENOUS | Status: AC
  Administered 2023-09-30: 6 mg
  Filled 2023-09-30: qty 6

## 2023-09-30 NOTE — ED Triage Notes (Signed)
 Patient reports he got off work yesterday and had some beers and then this morning walked to the store and started having palpitation and dizziness and headache.

## 2023-09-30 NOTE — ED Provider Notes (Signed)
 [place] EMERGENCY DEPARTMENT AT [facility] Provider Note  CSN: [id number] Arrival date & time: 09/30/23 1420  Chief Complaint(s) Tachycardia  HPI Jim Gordon is a 58 y.o. male with past medical history as below, significant for discoid lupus, psvt, svt, etoh/tobacco use who presents to the ED with complaint of svt  Patient here with palpitations, lightheaded, headache.  Did recently drink some alcohol last night.  Denies any illicit drug use.  Occasionally uses THC.  Continues to smoke cigarettes.  No nausea, vomiting, dyspnea, change in bowel or bladder function, fevers or chills.  Missed his recent cardiology follow-up  Past Medical History Past Medical History:  Diagnosis Date   Alcohol abuse    Discoid lupus    Essential hypertension    History of pneumonia    PSVT (paroxysmal supraventricular tachycardia) [facility])    Patient Active Problem List   Diagnosis Date Noted   Essential hypertension 10/11/2017   Chest pain 10/11/2017   Discoid lupus 10/11/2017   Tobacco abuse 10/11/2017   Alcohol abuse 10/11/2017   Chronic diastolic CHF (congestive heart failure) (HCC) 10/11/2017   Pneumonia 03/22/2015   Sepsis (HCC) 03/21/2015   Home Medication(s) Prior to Admission medications   Medication Sig Start Date End Date Taking? Authorizing Provider  metoprolol  succinate (TOPROL -XL) 25 MG 24 hr tablet Take 1 tablet (25 mg total) by mouth daily. 12/01/22 11/26/23  Johnson Laymon HERO, PA-C                                                                                                                                    Past Surgical History Past Surgical History:  Procedure Laterality Date   ELBOW SURGERY Left    Family History Family History  Problem Relation Age of Onset   Arthritis Mother    Diabetes Other     Social History Social History   Tobacco Use   Smoking status: Every Day    Current packs/day: 1.00    Average packs/day: 1 pack/day for 37.0 years  (37.0 ttl pk-yrs)    Types: Cigarettes    Start date: 09/23/1986   Smokeless tobacco: Never  Vaping Use   Vaping status: Never Used  Substance Use Topics   Alcohol use: Yes    Alcohol/week: 50.0 standard drinks of alcohol    Types: 50 Cans of beer per week    Comment: last this morning 03/21/2022   Drug use: Not Currently   Allergies Bee venom and Penicillins  Review of Systems A thorough review of systems was obtained and all systems are negative except as noted in the HPI and PMH.   Physical Exam Vital Signs  I have reviewed the triage vital signs BP 102/71 (BP Location: Right Arm)   Pulse (!) 171   Temp 98.6 F (37 C) (Oral)   Resp 16   Ht 6' 1.5 (1.867 m)   Wt 80.7 kg   SpO2  97%   BMI 23.17 kg/m  Physical Exam Vitals and nursing note reviewed.  Constitutional:      General: He is not in acute distress.    Appearance: He is well-developed.  HENT:     Head: Normocephalic and atraumatic.     Right Ear: External ear normal.     Left Ear: External ear normal.     Mouth/Throat:     Mouth: Mucous membranes are moist.  Eyes:     General: No scleral icterus. Cardiovascular:     Rate and Rhythm: Regular rhythm. Tachycardia present.     Pulses: Normal pulses.     Heart sounds: Normal heart sounds.     Comments: Narrow complex tachycardia Pulmonary:     Effort: Pulmonary effort is normal. No respiratory distress.     Breath sounds: Normal breath sounds.  Abdominal:     General: Abdomen is flat.     Palpations: Abdomen is soft.     Tenderness: There is no abdominal tenderness.  Musculoskeletal:     Cervical back: No rigidity.     Right lower leg: No edema.     Left lower leg: No edema.  Skin:    General: Skin is warm and dry.     Capillary Refill: Capillary refill takes less than 2 seconds.  Neurological:     Mental Status: He is alert.  Psychiatric:        Mood and Affect: Mood normal.        Behavior: Behavior normal.     ED Results and  Treatments Labs (all labs ordered are listed, but only abnormal results are displayed) Labs Reviewed  CBC WITH DIFFERENTIAL/PLATELET  BASIC METABOLIC PANEL WITH GFR                                                                                                                          Radiology No results found.  Pertinent labs & imaging results that were available during my care of the patient were reviewed by me and considered in my medical decision making (see MDM for details).  Medications Ordered in ED Medications  sodium chloride  0.9 % bolus 1,000 mL (1,000 mLs Intravenous New Bag/Given 09/30/23 1451)  adenosine  (ADENOCARD ) 6 MG/2ML injection (6 mg  Given 09/30/23 1454)  Procedures .Critical Care  Performed by: Elnor Jayson LABOR, DO Authorized by: Elnor Jayson LABOR, DO   Critical care provider statement:    Critical care time (minutes):  30   Critical care time was exclusive of:  Separately billable procedures and treating other patients   Critical care was necessary to treat or prevent imminent or life-threatening deterioration of the following conditions:  Cardiac failure   Critical care was time spent personally by me on the following activities:  Development of treatment plan with patient or surrogate, discussions with consultants, evaluation of patient's response to treatment, examination of patient, ordering and review of laboratory studies, ordering and review of radiographic studies, ordering and performing treatments and interventions, pulse oximetry, re-evaluation of patient's condition, review of old charts and obtaining history from patient or surrogate   (including critical care time)  Medical Decision Making / ED Course    Medical Decision Making:    Jim Gordon is a 58 y.o. male with past medical history as below, significant for  discoid lupus, psvt, svt, etoh/tobacco use who presents to the ED with complaint of svt. The complaint involves an extensive differential diagnosis and also carries with it a high risk of complications and morbidity.  Serious etiology was considered. Ddx includes but is not limited to: svt, sinus tachycardia, electrolyte derangement, etc  Complete initial physical exam performed, notably the patient was in no acute distress, SVT noted on telemetry.    Reviewed and confirmed nursing documentation for past medical history, family history, social history.  Vital signs reviewed.    Palpitations SVT > - hx recurrent SVT - SVT noted on initial EKG and telemetry, bp stable, no hypoxia, is feeling palpitations - chemical cardioversion w/ 6mg  adenosine  x1.  - check screening labs                        Additional history obtained: -Additional history obtained from {wsadditionalhistorian:28072} -External records from outside source obtained and reviewed including: Chart review including previous notes, labs, imaging, consultation notes including  ***   Lab Tests: -I ordered, reviewed, and interpreted labs.   The pertinent results include:   Labs Reviewed  CBC WITH DIFFERENTIAL/PLATELET  BASIC METABOLIC PANEL WITH GFR    Notable for ***  EKG   EKG Interpretation Date/Time:    Ventricular Rate:    PR Interval:    QRS Duration:    QT Interval:    QTC Calculation:   R Axis:      Text Interpretation:           Imaging Studies ordered: I ordered imaging studies including *** I independently visualized the following imaging with scope of interpretation limited to determining acute life threatening conditions related to emergency care; findings noted above I agree with the radiologist interpretation If any imaging was obtained with contrast I closely monitored patient for any possible adverse reaction a/w contrast administration in the emergency  department   Medicines ordered and prescription drug management: Meds ordered this encounter  Medications   sodium chloride  0.9 % bolus 1,000 mL   adenosine  (ADENOCARD ) 6 MG/2ML injection    [place] A: cabinet override    -I have reviewed the patients home medicines and have made adjustments as needed   Consultations Obtained: I requested consultation with the ***,  and discussed lab and imaging findings as well as pertinent plan - they recommend: ***   Cardiac Monitoring: The patient was maintained on a cardiac  monitor.  I personally viewed and interpreted the cardiac monitored which showed an underlying rhythm of: *** Continuous pulse oximetry interpreted by myself, ***% on ***.    Social Determinants of Health:  Diagnosis or treatment significantly limited by social determinants of health: {wssoc:28071}   Reevaluation: After the interventions noted above, I reevaluated the patient and found that they have {resolved/improved/worsened:23923::improved}  Co morbidities that complicate the patient evaluation  Past Medical History:  Diagnosis Date   Alcohol abuse    Discoid lupus    Essential hypertension    History of pneumonia    PSVT (paroxysmal supraventricular tachycardia) (HCC)       Dispostion: Disposition decision including need for hospitalization was considered, and patient {wsdispo:28070::discharged from emergency department.}    Final Clinical Impression(s) / ED Diagnoses Final diagnoses:  None

## 2023-09-30 NOTE — Discharge Instructions (Addendum)
 It was a pleasure caring for you today in the emergency department.  Please stop drinking alcohol, stop smoking cigarettes.  Please take your medications as prescribed.  Please follow-up with cardiology regarding rapid heart rate/SVT.  Get plenty of rest over the next couple days, drink plenty of fluids.  Follow-up with your PCP and cardiologist  Please return to the emergency department for any worsening or worrisome symptoms.

## 2023-09-30 NOTE — ED Notes (Signed)
 Pt placed on Zoll, provider at bedside.

## 2023-10-10 ENCOUNTER — Emergency Department (HOSPITAL_COMMUNITY)

## 2023-10-10 ENCOUNTER — Encounter (HOSPITAL_COMMUNITY): Payer: Self-pay

## 2023-10-10 ENCOUNTER — Emergency Department (HOSPITAL_COMMUNITY)
Admission: EM | Admit: 2023-10-10 | Discharge: 2023-10-10 | Disposition: A | Discharge status: Home / Self Care | Attending: Emergency Medicine | Admitting: Emergency Medicine

## 2023-10-10 ENCOUNTER — Other Ambulatory Visit: Payer: Self-pay

## 2023-10-10 DIAGNOSIS — I471 Supraventricular tachycardia, unspecified: Secondary | ICD-10-CM | POA: Diagnosis not present

## 2023-10-10 DIAGNOSIS — R000 Tachycardia, unspecified: Secondary | ICD-10-CM | POA: Diagnosis present

## 2023-10-10 DIAGNOSIS — I10 Essential (primary) hypertension: Secondary | ICD-10-CM | POA: Insufficient documentation

## 2023-10-10 DIAGNOSIS — L905 Scar conditions and fibrosis of skin: Secondary | ICD-10-CM | POA: Diagnosis not present

## 2023-10-10 DIAGNOSIS — Z7982 Long term (current) use of aspirin: Secondary | ICD-10-CM | POA: Insufficient documentation

## 2023-10-10 DIAGNOSIS — Z79899 Other long term (current) drug therapy: Secondary | ICD-10-CM | POA: Diagnosis not present

## 2023-10-10 LAB — COMPREHENSIVE METABOLIC PANEL WITH GFR
ALT: 24 U/L (ref 0–44)
AST: 33 U/L (ref 15–41)
Albumin: 4 g/dL (ref 3.5–5.0)
Alkaline Phosphatase: 67 U/L (ref 38–126)
Anion gap: 13 (ref 5–15)
BUN: 8 mg/dL (ref 6–20)
CO2: 25 mmol/L (ref 22–32)
Calcium: 9.4 mg/dL (ref 8.9–10.3)
Chloride: 100 mmol/L (ref 98–111)
Creatinine, Ser: 0.76 mg/dL (ref 0.61–1.24)
GFR, Estimated: >60 mL/min (ref >60)
Glucose, Bld: 93 mg/dL (ref 70–99)
Potassium: 3.8 mmol/L (ref 3.5–5.1)
Sodium: 138 mmol/L (ref 135–145)
Total Bilirubin: 0.3 mg/dL (ref 0.0–1.2)
Total Protein: 8.7 g/dL — ABNORMAL HIGH (ref 6.5–8.1)

## 2023-10-10 LAB — CBC WITH DIFFERENTIAL/PLATELET
Abs Immature Granulocytes: 0.01 K/uL (ref 0.00–0.07)
Basophils Absolute: 0 K/uL (ref 0.0–0.1)
Basophils Relative: 1 %
Eosinophils Absolute: 0.1 K/uL (ref 0.0–0.5)
Eosinophils Relative: 2 %
HCT: 40.1 % (ref 39.0–52.0)
Hemoglobin: 12.9 g/dL — ABNORMAL LOW (ref 13.0–17.0)
Immature Granulocytes: 0 %
Lymphocytes Relative: 50 %
Lymphs Abs: 2.4 K/uL (ref 0.7–4.0)
MCH: 29.7 pg (ref 26.0–34.0)
MCHC: 32.2 g/dL (ref 30.0–36.0)
MCV: 92.4 fL (ref 80.0–100.0)
Monocytes Absolute: 0.4 K/uL (ref 0.1–1.0)
Monocytes Relative: 7 %
Neutro Abs: 1.9 K/uL (ref 1.7–7.7)
Neutrophils Relative %: 40 %
Platelets: 225 K/uL (ref 150–400)
RBC: 4.34 MIL/uL (ref 4.22–5.81)
RDW: 13.1 % (ref 11.5–15.5)
WBC: 4.8 K/uL (ref 4.0–10.5)
nRBC: 0 % (ref 0.0–0.2)

## 2023-10-10 LAB — TROPONIN I (HIGH SENSITIVITY)
Troponin I (High Sensitivity): 7 ng/L (ref <18)
Troponin I (High Sensitivity): 8 ng/L (ref <18)

## 2023-10-10 LAB — MAGNESIUM: Magnesium: 2 mg/dL (ref 1.7–2.4)

## 2023-10-10 MED ORDER — METOPROLOL TARTRATE 5 MG/5ML IV SOLN
5.0000 mg | Freq: Once | INTRAVENOUS | Status: AC
  Administered 2023-10-10: 5 mg via INTRAVENOUS
  Filled 2023-10-10: qty 5

## 2023-10-10 MED ORDER — METOPROLOL TARTRATE 25 MG PO TABS
25.0000 mg | ORAL_TABLET | Freq: Once | ORAL | Status: AC
  Administered 2023-10-10: 25 mg via ORAL
  Filled 2023-10-10: qty 1

## 2023-10-10 MED ORDER — SODIUM CHLORIDE 0.9 % IV BOLUS
1000.0000 mL | Freq: Once | INTRAVENOUS | Status: AC
  Administered 2023-10-10: 1000 mL via INTRAVENOUS

## 2023-10-10 MED ORDER — ADENOSINE 6 MG/2ML IV SOLN
6.0000 mg | Freq: Once | INTRAVENOUS | Status: AC
  Administered 2023-10-10: 6 mg via INTRAVENOUS
  Filled 2023-10-10: qty 2

## 2023-10-10 MED ORDER — METOPROLOL TARTRATE 25 MG PO TABS
25.0000 mg | ORAL_TABLET | Freq: Every day | ORAL | 0 refills | Status: AC | PRN
Start: 2023-10-10 — End: ?

## 2023-10-10 NOTE — ED Provider Notes (Signed)
 [place] EMERGENCY DEPARTMENT AT [facility] Provider Note   CSN: [id number] Arrival date & time: 10/10/23  1728     Patient presents with: Tachycardia   Jim Gordon is a 58 y.o. male.   HPI Patient with history of SVT.  Was at work today and began to feel his heart going fast around an hour and a half prior to arrival.  Is on metoprolol  claims compliant.  However states that he did drink alcohol last night.   Past Medical History:  Diagnosis Date   Alcohol abuse    Discoid lupus    Essential hypertension    History of pneumonia    PSVT (paroxysmal supraventricular tachycardia) (HCC)     Prior to Admission medications   Medication Sig Start Date End Date Taking? Authorizing Provider  aspirin  EC 81 MG tablet Take 81 mg by mouth every 6 (six) hours as needed for mild pain (pain score 1-3). Swallow whole.   Yes [provider]  metoprolol  succinate (TOPROL -XL) 25 MG 24 hr tablet Take 1 tablet (25 mg total) by mouth daily. 12/01/22 11/26/23 Yes Strader, Laymon HERO, PA-C  metoprolol  tartrate (LOPRESSOR ) 25 MG tablet Take 1 tablet (25 mg total) by mouth daily as needed (SVT). 10/10/23  Yes Patsey Lot, MD    Allergies: Bee venom and Penicillins    Review of Systems  Updated Vital Signs BP (!) 156/111   Pulse 65   Resp 17   Ht 6' 1.5 (1.867 m)   Wt 80.7 kg   SpO2 98%   BMI 23.17 kg/m   Physical Exam Vitals and nursing note reviewed.  HENT:     Head:     Comments: Chronic scarring of face. Cardiovascular:     Rate and Rhythm: Regular rhythm. Tachycardia present.  Pulmonary:     Breath sounds: Rhonchi present. No wheezing.  Neurological:     Mental Status: He is alert.     (all labs ordered are listed, but only abnormal results are displayed) Labs Reviewed  COMPREHENSIVE METABOLIC PANEL WITH GFR - Abnormal; Notable for the following components:      Result Value   Total Protein 8.7 (*)    All other components within normal limits   CBC WITH DIFFERENTIAL/PLATELET - Abnormal; Notable for the following components:   Hemoglobin 12.9 (*)    All other components within normal limits  MAGNESIUM   TROPONIN I (HIGH SENSITIVITY)  TROPONIN I (HIGH SENSITIVITY)    EKG: EKG Interpretation Date/Time:  Tuesday October 10 2023 17:36:25 EDT Ventricular Rate:  171 PR Interval:  34 QRS Duration:  88 QT Interval:  286 QTC Calculation: 483 R Axis:   79  Text Interpretation: Supraventricular tachycardia Repolarization abnormality, prob rate related Confirmed by Patsey Lot [phone]) on 10/10/2023 5:48:33 PM  Radiology: [facility] Chest Port 1 View Result Date: 10/10/2023 CLINICAL DATA:  Tachycardia. EXAM: PORTABLE CHEST 1 VIEW COMPARISON:  Chest radiograph dated 10/26/2022. FINDINGS: No focal consolidation or pneumothorax. Chronic blunting of the right costophrenic angle. Stable cardiac silhouette. No acute osseous pathology. IMPRESSION: No focal consolidation. Electronically Signed   By: Vanetta Chou M.D.   On: 10/10/2023 18:10     Procedures   Medications Ordered in the ED  adenosine  (ADENOCARD ) 6 MG/2ML injection 6 mg (6 mg Intravenous Given 10/10/23 1742)  metoprolol  tartrate (LOPRESSOR ) injection 5 mg (5 mg Intravenous Given 10/10/23 1828)  sodium chloride  0.9 % bolus 1,000 mL (0 mLs Intravenous Stopped 10/10/23 1829)  metoprolol  tartrate (LOPRESSOR ) tablet 25  mg (25 mg Oral Given 10/10/23 1931)  metoprolol  tartrate (LOPRESSOR ) injection 5 mg (5 mg Intravenous Given 10/10/23 1931)                                    Medical Decision Making Amount and/or Complexity of Data Reviewed Labs: ordered. Radiology: ordered.  Risk Prescription drug management.   Patient with history of SVT.  Presents in what appears to be an SVT.  Initially did have conversion to sinus with bigeminy but only lasted couple seconds.  This was just with some contact with the abdomen.  Unable to convert with vagal stimulation.  Given 6 mg of  adenosine  and also had an episode of conversion back to sinus with ectopy.  Plan for metoprolol  IV.  However did convert before this on its own.  Will continue to monitor.  Will give fluid bolus and get basic blood work.  Went back into fast heart rate.  Metoprolol  given and has improved rate.  Now having some more mid chest pain.  Will now get troponin.  Patient now in SVT again.  Metoprolol  had helped but likely worn off some now.  Will give IV metoprolol  again and some oral metoprolol .  Patient is hoping to go back home because he states he has to work in the morning.  Patient is now maintained sinus rhythm.  Oral dose of metoprolol  likely has helped suppress the return.    Will give oral as needed metoprolol  for episodes at home to hopefully help keep him out of the ER.  Will have follow-up with cardiology.  CRITICAL CARE Performed by: [place] Total critical care time: 30 minutes Critical care time was exclusive of separately billable procedures and treating other patients. Critical care was necessary to treat or prevent imminent or life-threatening deterioration. Critical care was time spent personally by me on the following activities: development of treatment plan with patient and/or surrogate as well as nursing, discussions with consultants, evaluation of patient's response to treatment, examination of patient, obtaining history from patient or surrogate, ordering and performing treatments and interventions, ordering and review of laboratory studies, ordering and review of radiographic studies, pulse oximetry and re-evaluation of patient's condition.      Final diagnoses:  SVT (supraventricular tachycardia) [facility])    ED Discharge Orders          Ordered    Ambulatory referral to Cardiology       Comments: If you have not heard from the Cardiology office within the next 72 hours please call [phone].   10/10/23 2050    metoprolol  tartrate (LOPRESSOR ) 25 MG  tablet  Daily PRN        10/10/23 2050               River Rankin, MD 10/10/23 2052

## 2023-10-10 NOTE — ED Notes (Signed)
 Second Trop was not collected, MD made aware. MD was good to discharge without collecting the second trop.

## 2023-10-10 NOTE — Discharge Instructions (Addendum)
 Try to drink less, as it seems to make you more likely to have SVT.  You have been given a prescription for shorter acting metoprolol .  Continue to take your Toprol -XL daily but if you feel NSVT you can take the extra metoprolol  tablet.  Follow-up with cardiology

## 2023-10-10 NOTE — ED Triage Notes (Signed)
 Pt arrived via POV from work c/o feeling like his heart rate was elevated and endorses slight chest discomfort. Pt reports he began feeling dizzy PTA. 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

## 2023-10-10 NOTE — ED Notes (Signed)
 ED Provider at bedside.

## 2023-10-16 ENCOUNTER — Ambulatory Visit: Admitting: Cardiology

## 2023-10-16 ENCOUNTER — Encounter: Payer: Self-pay | Admitting: Cardiology

## 2023-11-23 NOTE — Progress Notes (Deleted)
 Cardiology Office Note    Date:  11/23/2023  ID:  Jim Gordon, DOB 1966-02-20, MRN [id number] Cardiologist: Jayson Sierras, MD { :  History of Present Illness:    Jim Gordon is a 58 y.o. male with past medical history of coronary calcification by CT, SVT, discoid lupus, alcohol use and tobacco use who presents to the office today for follow-up from a recent Emergency Department visit.   He was examined by myself in 02/2023 and reported overall doing well since his last office visit. He reported only brief episodes of palpitations and no persistent symptoms. He had reduced his tobacco use to less than a half a pack per day and reduced alcohol intake to 1-2 beers per day. He was continued on Toprol -XL 25 mg daily and was recommended to check an FLP and Hgb A1c for risk stratification given coronary calcification by prior CT (labs never obtained).  In the interim, he did present to [facility] ED on 09/30/2023 for evaluations of palpitations which had occurred after consuming alcohol the previous night. He was found to be in SVT and received Adenosine  6 mg with conversion back to normal sinus rhythm. Presented back to the ED on 10/10/2023 for recurrent palpitations and dizziness. Reported consuming alcohol the night before as well. Vagal maneuvers were attempted but he had ultimately required Adenosine  and converted back to normal sinus rhythm. He had recurrence while in the ED and received Metoprolol  with return to normal sinus rhythm. Was referred back to Cardiology for further assessment.  - titrate Toprol -XL? - EP referral?  ROS: ***  Studies Reviewed:   EKG: EKG is*** ordered today and demonstrates ***   EKG Interpretation Date/Time:    Ventricular Rate:    PR Interval:    QRS Duration:    QT Interval:    QTC Calculation:   R Axis:      Text Interpretation:         Echocardiogram: 12/2022 IMPRESSIONS     1. Left ventricular ejection fraction, by estimation, is 65 to  70%. The  left ventricle has normal function. The left ventricle has no regional  wall motion abnormalities. There is moderate left ventricular hypertrophy.  Left ventricular diastolic  parameters are consistent with Grade I diastolic dysfunction (impaired  relaxation).   2. Right ventricular systolic function is normal. The right ventricular  size is normal. Tricuspid regurgitation signal is inadequate for assessing  PA pressure.   3. The mitral valve is normal in structure. No evidence of mitral valve  regurgitation. No evidence of mitral stenosis.   4. The aortic valve is tricuspid. Aortic valve regurgitation is not  visualized. No aortic stenosis is present.   5. The inferior vena cava is normal in size with greater than 50%  respiratory variability, suggesting right atrial pressure of 3 mmHg.   Risk Assessment/Calculations:   {Does this patient have ATRIAL FIBRILLATION?:[phone]} No BP recorded.  {Refresh Note OR Click here to enter BP  :1}***         Physical Exam:   VS:  There were no vitals taken for this visit.   Wt Readings from Last 3 Encounters:  10/10/23 178 lb (80.7 kg)  09/30/23 178 lb (80.7 kg)  03/02/23 178 lb (80.7 kg)     GEN: Well nourished, well developed in no acute distress NECK: No JVD; No carotid bruits CARDIAC: ***RRR, no murmurs, rubs, gallops RESPIRATORY:  Clear to auscultation without rales, wheezing or rhonchi  ABDOMEN: Appears non-distended. No  obvious abdominal masses. EXTREMITIES: No clubbing or cyanosis. No edema.  Distal pedal pulses are 2+ bilaterally.   Assessment and Plan:   1. SVT (supraventricular tachycardia) (HCC) ***  2. Coronary artery calcification seen on CT scan ***  3. Alcohol abuse ***   Signed, Laymon CHRISTELLA Qua, PA-C

## 2023-11-24 ENCOUNTER — Ambulatory Visit: Attending: Student | Admitting: Student

## 2023-11-24 DIAGNOSIS — I2510 Atherosclerotic heart disease of native coronary artery without angina pectoris: Secondary | ICD-10-CM

## 2023-11-24 DIAGNOSIS — F1010 Alcohol abuse, uncomplicated: Secondary | ICD-10-CM

## 2023-11-24 DIAGNOSIS — I471 Supraventricular tachycardia, unspecified: Secondary | ICD-10-CM

## 2024-03-07 ENCOUNTER — Other Ambulatory Visit: Payer: Self-pay | Admitting: Student

## 2024-03-08 ENCOUNTER — Other Ambulatory Visit: Payer: Self-pay

## 2024-03-08 ENCOUNTER — Telehealth: Payer: Self-pay | Admitting: Cardiology

## 2024-03-08 MED ORDER — METOPROLOL SUCCINATE ER 25 MG PO TB24: 25.0000 mg | ORAL_TABLET | Freq: Every day | ORAL | 0 refills | Status: AC

## 2024-03-08 NOTE — Telephone Encounter (Signed)
 Pt requested refill for metoprolol  succinate from [facility] on [address]. The pharmacy told the pt they sent a request and haven't gotten a response and that the pt should reach out to the provider.

## 2024-03-08 NOTE — Telephone Encounter (Signed)
 Rx was refilled yesterday, needs apt, refill sent again

## 2024-04-18 NOTE — Progress Notes (Unsigned)
 " Cardiology Office Note:  .   Date:  04/22/2024  ID:  Jim Gordon, DOB 1965/05/21, MRN [id number] PCP: Patient, No Pcp Per  [facility] Providers Cardiologist:  Jayson Sierras, MD {  History of Present Illness: .   Jim Gordon is a 59 y.o. male  with PMHx of coronary calcification by CT, SVT, discoid lupus, alcohol use and tobacco use who reports to [facility] office for overdue hospital follow up.   Pertinent cardiac medical history:  Echo 12/2022: EF 65-70%, moderate LVH, G1DD, no significant valvular abnormalities.  ED visit 09/2023 x2 with SVT treated successfully with Adenosine .   Last seen in heartcare 02/2023 by Laymon Qua, PA-C and reported overall doing well since his last office visit. He reported only brief episodes of palpitations and no persistent symptoms. He had reduced his tobacco use to less than a half a pack per day and reduced alcohol intake to 1-2 beers per day. He was continued on Toprol -XL 25 mg daily and was recommended to check an FLP and Hgb A1c for risk stratification given coronary calcification by prior CT (labs never obtained).   In the interim, presented to [facility] on 09/30/2023 for evaluations of palpitations which had occurred after consuming alcohol the previous night. He was found to be in SVT and received Adenosine  6 mg with conversion back to normal sinus rhythm. Presented back to the ED on 10/10/2023 for recurrent palpitations and dizziness. Reported consuming alcohol the night before as well. Vagal maneuvers were attempted but he had ultimately required Adenosine  and converted back to normal sinus rhythm. He had recurrence while in the ED and received Metoprolol  with return to normal sinus rhythm. Was referred back to Cardiology for further assessment. Continued on ASA 81 mg daily, Toprol  Xl 25 mg daily, and Lopressor  25 mg PRN.   Today, he noted racing heart rate, shortness of breath, and lightheadedness while walking ~1 mile to his  appointment. He reports missing medications for 2 days due to being away from home and poor oral intake over the past several days. He denies chest pain, syncope, presyncope, orthopnea, PND, edema, significant weight changes, bleeding, or claudication. He remains fairly active at baseline. He has reduced smoking to 2 cigarettes daily, drinks 1-3 (24-oz) beers daily, and denies drug use. No recent hospitalizations or ED visits.  ROS: 10 point review of system has been reviewed and considered negative except ones been listed in the HPI.   Studies Reviewed: SABRA   EKG Interpretation Date/Time:  Monday April 22 2024 13:12:22 EST Ventricular Rate:  158 PR Interval:    QRS Duration:  98 QT Interval:  278 QTC Calculation: 450 R Axis:   88  Text Interpretation: Supraventricular tachycardia When compared with ECG of 10-Oct-2023 17:57, PREVIOUS ECG IS PRESENT Confirmed by Sheron Hallmark ([id number]) on 04/22/2024 1:14:31 PM   ECHO IMPRESSIONS 12/2022  1. Left ventricular ejection fraction, by estimation, is 65 to 70%. The  left ventricle has normal function. The left ventricle has no regional  wall motion abnormalities. There is moderate left ventricular hypertrophy.  Left ventricular diastolic  parameters are consistent with Grade I diastolic dysfunction (impaired  relaxation).   2. Right ventricular systolic function is normal. The right ventricular  size is normal. Tricuspid regurgitation signal is inadequate for assessing  PA pressure.   3. The mitral valve is normal in structure. No evidence of mitral valve  regurgitation. No evidence of mitral stenosis.   4. The aortic valve is  tricuspid. Aortic valve regurgitation is not  visualized. No aortic stenosis is present.   5. The inferior vena cava is normal in size with greater than 50%  respiratory variability, suggesting right atrial pressure of 3 mmHg.   CV Studies: Cardiac studies reviewed are outlined and summarized above. Otherwise please  see EMR for full report.   Physical Exam:   VS:  BP 98/68 (BP Location: Right Arm)   Pulse 82   Ht 6' 1.5 (1.867 m)   Wt 176 lb (79.8 kg)   SpO2 99%   BMI 22.91 kg/m    Wt Readings from Last 3 Encounters:  04/22/24 176 lb (79.8 kg)  10/10/23 178 lb (80.7 kg)  09/30/23 178 lb (80.7 kg)    GEN: Well nourished, well developed in no acute distress while sitting in chair.  NECK: No JVD; No carotid bruits CARDIAC: RRR, no murmurs, rubs, gallops RESPIRATORY:  Clear to auscultation without rales, wheezing or rhonchi  ABDOMEN: Soft, non-tender, non-distended EXTREMITIES:  No edema; No deformity   ASSESSMENT AND PLAN: .    Labs reviewed: CBC, CMP, MG WNL except Hgb 12.9 in 09/2023.   SVT (supraventricular tachycardia) Presents with palpitations, SOB and Iightheadness in setting of hypotension with BP 98/68.  EKG today: SVT, HR 150's  Will send to ED for medication management.  Ordered outpatient CBC, CMP and MG, but these can be completed in ED.  Discuss EP referral at next OV with recurrent SVT.  Encouraged daily compliance with Toprol  XL 25 mg daily.   Coronary artery calcification seen on CT scan Order FLP and Hgb A1c for risk stratification given coronary calcification by prior CT.    Alcohol abuse Tobacco abuse Encouraged cessation.   Dispo: Follow up in 1 month.   Signed, Lorette CINDERELLA Kapur, PA-C  "

## 2024-04-22 ENCOUNTER — Ambulatory Visit: Admitting: Physician Assistant

## 2024-04-22 ENCOUNTER — Other Ambulatory Visit: Payer: Self-pay

## 2024-04-22 ENCOUNTER — Emergency Department (HOSPITAL_COMMUNITY)
Admission: EM | Admit: 2024-04-22 | Discharge: 2024-04-22 | Disposition: A | Discharge status: Home / Self Care | Attending: Emergency Medicine | Admitting: Emergency Medicine

## 2024-04-22 ENCOUNTER — Encounter: Payer: Self-pay | Admitting: Physician Assistant

## 2024-04-22 VITALS — BP 98/68 | HR 82 | Ht 73.5 in | Wt 176.0 lb

## 2024-04-22 DIAGNOSIS — Z720 Tobacco use: Secondary | ICD-10-CM | POA: Diagnosis not present

## 2024-04-22 DIAGNOSIS — Z79899 Other long term (current) drug therapy: Secondary | ICD-10-CM | POA: Insufficient documentation

## 2024-04-22 DIAGNOSIS — F1010 Alcohol abuse, uncomplicated: Secondary | ICD-10-CM | POA: Insufficient documentation

## 2024-04-22 DIAGNOSIS — I471 Supraventricular tachycardia, unspecified: Secondary | ICD-10-CM

## 2024-04-22 DIAGNOSIS — I2510 Atherosclerotic heart disease of native coronary artery without angina pectoris: Secondary | ICD-10-CM | POA: Diagnosis not present

## 2024-04-22 DIAGNOSIS — Z7982 Long term (current) use of aspirin: Secondary | ICD-10-CM | POA: Insufficient documentation

## 2024-04-22 DIAGNOSIS — F17200 Nicotine dependence, unspecified, uncomplicated: Secondary | ICD-10-CM | POA: Insufficient documentation

## 2024-04-22 DIAGNOSIS — I10 Essential (primary) hypertension: Secondary | ICD-10-CM | POA: Insufficient documentation

## 2024-04-22 LAB — CBC WITH DIFFERENTIAL/PLATELET
Abs Immature Granulocytes: 0.01 10*3/uL (ref 0.00–0.07)
Basophils Absolute: 0 10*3/uL (ref 0.0–0.1)
Basophils Relative: 1 %
Eosinophils Absolute: 0.1 10*3/uL (ref 0.0–0.5)
Eosinophils Relative: 3 %
HCT: 41 % (ref 39.0–52.0)
Hemoglobin: 13.2 g/dL (ref 13.0–17.0)
Immature Granulocytes: 0 %
Lymphocytes Relative: 46 %
Lymphs Abs: 1.9 10*3/uL (ref 0.7–4.0)
MCH: 29.6 pg (ref 26.0–34.0)
MCHC: 32.2 g/dL (ref 30.0–36.0)
MCV: 91.9 fL (ref 80.0–100.0)
Monocytes Absolute: 0.3 10*3/uL (ref 0.1–1.0)
Monocytes Relative: 9 %
Neutro Abs: 1.6 10*3/uL — ABNORMAL LOW (ref 1.7–7.7)
Neutrophils Relative %: 41 %
Platelets: 230 10*3/uL (ref 150–400)
RBC: 4.46 MIL/uL (ref 4.22–5.81)
RDW: 12.4 % (ref 11.5–15.5)
WBC: 4 10*3/uL (ref 4.0–10.5)
nRBC: 0 % (ref 0.0–0.2)

## 2024-04-22 LAB — COMPREHENSIVE METABOLIC PANEL WITH GFR
ALT: 12 U/L (ref 0–44)
AST: 23 U/L (ref 15–41)
Albumin: 4.1 g/dL (ref 3.5–5.0)
Alkaline Phosphatase: 73 U/L (ref 38–126)
Anion gap: 13 (ref 5–15)
BUN: 10 mg/dL (ref 6–20)
CO2: 22 mmol/L (ref 22–32)
Calcium: 9.4 mg/dL (ref 8.9–10.3)
Chloride: 101 mmol/L (ref 98–111)
Creatinine, Ser: 0.85 mg/dL (ref 0.61–1.24)
GFR, Estimated: >60 mL/min
Glucose, Bld: 90 mg/dL (ref 70–99)
Potassium: 4.2 mmol/L (ref 3.5–5.1)
Sodium: 136 mmol/L (ref 135–145)
Total Bilirubin: 0.5 mg/dL (ref 0.0–1.2)
Total Protein: 7.7 g/dL (ref 6.5–8.1)

## 2024-04-22 LAB — MAGNESIUM: Magnesium: 2.2 mg/dL (ref 1.7–2.4)

## 2024-04-22 LAB — ETHANOL: Alcohol, Ethyl (B): <15 mg/dL

## 2024-04-22 MED ORDER — METOPROLOL SUCCINATE ER 25 MG PO TB24
25.0000 mg | ORAL_TABLET | Freq: Every day | ORAL | Status: DC
  Administered 2024-04-22: 25 mg via ORAL
  Filled 2024-04-22: qty 1

## 2024-04-22 NOTE — Discharge Instructions (Addendum)
 Today's labs have been reassuring.  As you discussed with the cardiology team earlier today, is important to follow-up in the clinic.  Be sure to take all medication as prescribed.  Return here for concerning changes in your condition.

## 2024-05-20 ENCOUNTER — Ambulatory Visit: Admitting: Physician Assistant

## 2024-07-02 ENCOUNTER — Ambulatory Visit: Admitting: Physician Assistant
